# Patient Record
Sex: Male | Born: 1950 | Race: White | Hispanic: No | Marital: Married | Smoking: Former smoker
Health system: Southern US, Community
[De-identification: ages and names within clinical notes are randomized; demographics above are authoritative.]

## PROBLEM LIST (undated history)

## (undated) DIAGNOSIS — Z8579 Personal history of other malignant neoplasms of lymphoid, hematopoietic and related tissues: Secondary | ICD-10-CM

## (undated) DIAGNOSIS — I10 Essential (primary) hypertension: Secondary | ICD-10-CM

## (undated) DIAGNOSIS — Z8572 Personal history of non-Hodgkin lymphomas: Secondary | ICD-10-CM

## (undated) DIAGNOSIS — E7800 Pure hypercholesterolemia, unspecified: Secondary | ICD-10-CM

## (undated) DIAGNOSIS — I2510 Atherosclerotic heart disease of native coronary artery without angina pectoris: Secondary | ICD-10-CM

## (undated) HISTORY — PX: CHOLECYSTECTOMY: SHX55

## (undated) HISTORY — PX: CARDIAC CATHETERIZATION: SHX172

## (undated) HISTORY — PX: ELBOW SURGERY: SHX618

## (undated) HISTORY — PX: CERVICAL SPINE SURGERY: SHX589

## (undated) HISTORY — PX: KIDNEY STONE SURGERY: SHX686

---

## 2012-08-06 DIAGNOSIS — H903 Sensorineural hearing loss, bilateral: Secondary | ICD-10-CM | POA: Insufficient documentation

## 2012-08-06 DIAGNOSIS — D333 Benign neoplasm of cranial nerves: Secondary | ICD-10-CM | POA: Insufficient documentation

## 2014-04-23 DIAGNOSIS — R21 Rash and other nonspecific skin eruption: Secondary | ICD-10-CM | POA: Insufficient documentation

## 2014-08-31 DIAGNOSIS — L03119 Cellulitis of unspecified part of limb: Secondary | ICD-10-CM | POA: Insufficient documentation

## 2014-08-31 DIAGNOSIS — L02519 Cutaneous abscess of unspecified hand: Secondary | ICD-10-CM | POA: Insufficient documentation

## 2014-09-23 DIAGNOSIS — E785 Hyperlipidemia, unspecified: Secondary | ICD-10-CM | POA: Insufficient documentation

## 2014-09-23 DIAGNOSIS — Z955 Presence of coronary angioplasty implant and graft: Secondary | ICD-10-CM | POA: Insufficient documentation

## 2014-09-23 DIAGNOSIS — Z8249 Family history of ischemic heart disease and other diseases of the circulatory system: Secondary | ICD-10-CM | POA: Insufficient documentation

## 2016-05-28 ENCOUNTER — Other Ambulatory Visit: Payer: Self-pay | Admitting: Orthopaedic Surgery

## 2016-05-28 DIAGNOSIS — M546 Pain in thoracic spine: Secondary | ICD-10-CM

## 2016-06-07 DIAGNOSIS — M545 Low back pain, unspecified: Secondary | ICD-10-CM | POA: Insufficient documentation

## 2016-06-07 DIAGNOSIS — G8929 Other chronic pain: Secondary | ICD-10-CM | POA: Insufficient documentation

## 2017-02-21 DIAGNOSIS — IMO0001 Reserved for inherently not codable concepts without codable children: Secondary | ICD-10-CM | POA: Insufficient documentation

## 2018-06-16 ENCOUNTER — Encounter: Payer: Self-pay | Admitting: Podiatry

## 2018-06-16 ENCOUNTER — Ambulatory Visit (INDEPENDENT_AMBULATORY_CARE_PROVIDER_SITE_OTHER): Payer: Medicare Other | Admitting: Podiatry

## 2018-06-16 ENCOUNTER — Ambulatory Visit (INDEPENDENT_AMBULATORY_CARE_PROVIDER_SITE_OTHER): Payer: Medicare Other

## 2018-06-16 VITALS — BP 167/82 | HR 60

## 2018-06-16 DIAGNOSIS — M722 Plantar fascial fibromatosis: Secondary | ICD-10-CM | POA: Diagnosis not present

## 2018-06-16 MED ORDER — MELOXICAM 15 MG PO TABS: 15.0000 mg | ORAL_TABLET | Freq: Every day | ORAL | 1 refills | Status: AC

## 2018-06-18 NOTE — Progress Notes (Signed)
   Subjective: 68 year old male presenting today as a new patient with a chief complaint of constant pain to the left arch and plantar heel that began 2-3 weeks ago. Walking increases the pain and it is also worse in the morning when he first gets out of bed. He has been using custom orthotics he received from the [place] with minimal relief. Patient is here for further evaluation and treatment.   History reviewed. No pertinent past medical history.   Objective: Physical Exam General: The patient is alert and oriented x3 in no acute distress.  Dermatology: Skin is warm, dry and supple bilateral lower extremities. Negative for open lesions or macerations bilateral.   Vascular: Dorsalis Pedis and Posterior Tibial pulses palpable bilateral.  Capillary fill time is immediate to all digits.  Neurological: Epicritic and protective threshold intact bilateral.   Musculoskeletal: Tenderness to palpation to the plantar aspect of the left heel along the plantar fascia. All other joints range of motion within normal limits bilateral. Strength 5/5 in all groups bilateral.   Radiographic exam:   Normal osseous mineralization. Joint spaces preserved. No fracture/dislocation/boney destruction. No other soft tissue abnormalities or radiopaque foreign bodies.   Assessment: 1. Plantar fasciitis left foot  Plan of Care:  1. Patient evaluated. Xrays reviewed.   2. Injection of 0.5cc Celestone soluspan injected into the left plantar fascia.  3. Patient declined prescription for Medrol Dose Pak.  4. Rx for Meloxicam ordered for patient. 5. Continue using custom orthotics from the [place]. 6. Instructed patient regarding therapies and modalities at home to alleviate symptoms.  7. Return to clinic in 4 weeks.    From [place].   Edrick Kins, DPM [facility]  Dr. Edrick Kins, DPM    [address], [place] [address]                Office [phone]  Fax [phone]

## 2018-07-16 ENCOUNTER — Encounter: Payer: Self-pay | Admitting: Podiatry

## 2018-07-16 ENCOUNTER — Ambulatory Visit (INDEPENDENT_AMBULATORY_CARE_PROVIDER_SITE_OTHER): Payer: Medicare Other | Admitting: Podiatry

## 2018-07-16 ENCOUNTER — Other Ambulatory Visit: Payer: Medicare Other | Admitting: Orthotics

## 2018-07-16 DIAGNOSIS — M722 Plantar fascial fibromatosis: Secondary | ICD-10-CM | POA: Diagnosis not present

## 2018-07-20 NOTE — Progress Notes (Signed)
   Subjective: 68 year old male presenting today for follow up evaluation of plantar fasciitis of the left foot. He states the pain has improved but has not completely resolved. He states the foot is still sensitive when he first gets up in the morning time but improves as the day goes on. He notes the pain is about 1-2 throughout the day. He has been taking Meloxicam for treatment as directed. Patient is here for further evaluation and treatment.   History reviewed. No pertinent past medical history.   Objective: Physical Exam General: The patient is alert and oriented x3 in no acute distress.  Dermatology: Skin is warm, dry and supple bilateral lower extremities. Negative for open lesions or macerations bilateral.   Vascular: Dorsalis Pedis and Posterior Tibial pulses palpable bilateral.  Capillary fill time is immediate to all digits.  Neurological: Epicritic and protective threshold intact bilateral.   Musculoskeletal: Tenderness to palpation to the plantar aspect of the left heel along the plantar fascia. All other joints range of motion within normal limits bilateral. Strength 5/5 in all groups bilateral.   Assessment: 1. Plantar fasciitis left foot - improved   Plan of Care:  1. Patient evaluated.    2. Injection of 0.5cc Celestone soluspan injected into the left plantar fascia.  3. Appointment with Liliane Channel, Pedorthist, for custom molded orthotics.  4. Continue taking Meloxicam.  5. Return to clinic as needed.    From [place].   Edrick Kins, DPM [facility]  Dr. Edrick Kins, DPM    [address], [place] [address]                Office [phone]  Fax [phone]

## 2018-09-09 ENCOUNTER — Telehealth: Payer: Self-pay | Admitting: *Deleted

## 2018-09-09 MED ORDER — METHYLPREDNISOLONE 4 MG PO TBPK: ORAL_TABLET | ORAL | 0 refills | Status: DC

## 2018-09-09 NOTE — Telephone Encounter (Signed)
Pt's wife, [name] called states Dr. Amalia Hailey had offered a medrol dose pack for pt at one time and pt would like to have the medrol dose pack called in

## 2018-09-09 NOTE — Telephone Encounter (Signed)
I called pt's wife, Pam and informed I had to wait for a response from Dr. Amalia Hailey who was in the [place] office today and would respond around lunchtime, to call the pharmacy in 1 hour.

## 2018-09-09 NOTE — Telephone Encounter (Signed)
I informed Pam the medrol dose pack had been sent to the pharmacy.

## 2018-09-09 NOTE — Telephone Encounter (Signed)
Dr. Trinidad Curet the medrol dose pack as offered 06/16/2018.

## 2018-09-15 ENCOUNTER — Encounter: Payer: Self-pay | Admitting: Podiatry

## 2018-09-15 ENCOUNTER — Ambulatory Visit (INDEPENDENT_AMBULATORY_CARE_PROVIDER_SITE_OTHER): Payer: Medicare Other | Admitting: Podiatry

## 2018-09-15 ENCOUNTER — Other Ambulatory Visit: Payer: Self-pay

## 2018-09-15 DIAGNOSIS — M722 Plantar fascial fibromatosis: Secondary | ICD-10-CM

## 2018-09-15 NOTE — Progress Notes (Signed)
   Subjective: 68 year old male presenting today for follow up evaluation of plantar fasciitis of the left foot.  Patient states that he was doing fine with no pain until approximately 2 weeks ago.  He denies any change in activity.  He began to notice significant pain and he is currently using a walking stick in the mornings to get out of bed and walk around.  He presents for further treatment evaluation   No past medical history on file.   Objective: Physical Exam General: The patient is alert and oriented x3 in no acute distress.  Dermatology: Skin is warm, dry and supple bilateral lower extremities. Negative for open lesions or macerations bilateral.   Vascular: Dorsalis Pedis and Posterior Tibial pulses palpable bilateral.  Capillary fill time is immediate to all digits.  Neurological: Epicritic and protective threshold intact bilateral.   Musculoskeletal: Tenderness to palpation to the plantar aspect of the left heel along the plantar fascia. All other joints range of motion within normal limits bilateral. Strength 5/5 in all groups bilateral.   Assessment: 1. Plantar fasciitis left foot  Plan of Care:  1. Patient evaluated.    2. Injection of 0.5cc Celestone soluspan injected into the left plantar fascia.  3.  Appointment for new custom molded orthotics with Liliane Channel, Pedorthist, are pending since the patient has not been able to go to the [place] to turn in the prescription because of the COVID-19 pandemic 4.  Today night splint was provided for the patient with instructions to help alleviate morning pain and keep the foot in a flexed position while sleeping 5.  Plantar fascial brace dispensed to wear during the day 6.  Return to clinic in 4 weeks.  If the patient has not improved we may need to discuss surgical intervention since the patient has a longstanding history of intermittent plantar fascial pain  From [place].   Edrick Kins, DPM [facility]  Dr. Edrick Kins, DPM    [address], [place] [address]                Office [phone]  Fax [phone]

## 2018-10-13 ENCOUNTER — Encounter: Payer: Self-pay | Admitting: Podiatry

## 2018-10-13 ENCOUNTER — Other Ambulatory Visit: Payer: Self-pay

## 2018-10-13 ENCOUNTER — Ambulatory Visit (INDEPENDENT_AMBULATORY_CARE_PROVIDER_SITE_OTHER): Payer: Medicare Other | Admitting: Podiatry

## 2018-10-13 VITALS — Temp 98.2°F

## 2018-10-13 DIAGNOSIS — M722 Plantar fascial fibromatosis: Secondary | ICD-10-CM | POA: Diagnosis not present

## 2018-10-13 NOTE — Progress Notes (Signed)
   Subjective: 68 year old male presenting today for follow up evaluation of plantar fasciitis of the left foot.  Patient states that he is no longer experiencing pain to the left heel.  He noticed significant improvement with the plantar fascial brace as well as the night splint.  He also says the injection he received last visit on 09/15/2018 helped significantly.  He is still waiting to get custom molded orthotics from the [place].  He has an appointment in July of this year.  No past medical history on file.   Objective: Physical Exam General: The patient is alert and oriented x3 in no acute distress.  Dermatology: Skin is warm, dry and supple bilateral lower extremities. Negative for open lesions or macerations bilateral.   Vascular: Dorsalis Pedis and Posterior Tibial pulses palpable bilateral.  Capillary fill time is immediate to all digits.  Neurological: Epicritic and protective threshold intact bilateral.   Musculoskeletal: Significantly improved tenderness to palpation to the plantar aspect of the left heel along the plantar fascia. All other joints range of motion within normal limits bilateral. Strength 5/5 in all groups bilateral.   Assessment: 1. Plantar fasciitis left foot  Plan of Care:  1. Patient evaluated.    2.  Patient declined injection today. 3.  Continue wearing plantar fascial brace and night splint as needed 4.  Continue wearing good supportive shoes with his old custom insoles.  Patient is planning on getting new insoles from the [place].  5.  Return to clinic as needed  [place].   Edrick Kins, DPM [facility]  Dr. Edrick Kins, DPM    [address], [place] [address]                Office [phone]  Fax [phone]

## 2018-11-05 ENCOUNTER — Ambulatory Visit: Payer: PRIVATE HEALTH INSURANCE | Admitting: Podiatry

## 2019-05-26 ENCOUNTER — Ambulatory Visit: Payer: PRIVATE HEALTH INSURANCE | Admitting: Cardiovascular Disease

## 2019-08-21 ENCOUNTER — Ambulatory Visit (INDEPENDENT_AMBULATORY_CARE_PROVIDER_SITE_OTHER): Payer: Medicare Other | Admitting: Podiatry

## 2019-08-21 ENCOUNTER — Other Ambulatory Visit: Payer: Self-pay

## 2019-08-21 DIAGNOSIS — M722 Plantar fascial fibromatosis: Secondary | ICD-10-CM

## 2019-08-23 NOTE — Progress Notes (Signed)
   Subjective: 69 year old male presenting today with a chief complaint of a plantar fasciitis flare up of the left foot that began about four days ago. He reports sharp, burning pain. He reports working on a ladder lately and believes that is what has exacerbated his symptoms. He has been using the plantar fascial brace with some relief. Being on the foot for long periods of time increases the pain. Patient is here for further evaluation and treatment.   No past medical history on file.   Objective: Physical Exam General: The patient is alert and oriented x3 in no acute distress.  Dermatology: Skin is warm, dry and supple bilateral lower extremities. Negative for open lesions or macerations bilateral.   Vascular: Dorsalis Pedis and Posterior Tibial pulses palpable bilateral.  Capillary fill time is immediate to all digits.  Neurological: Epicritic and protective threshold intact bilateral.   Musculoskeletal: Tenderness to palpation to the plantar aspect of the left heel along the plantar fascia. All other joints range of motion within normal limits bilateral. Strength 5/5 in all groups bilateral.   Assessment: 1. Plantar fasciitis left foot  Plan of Care:  1. Patient evaluated.    2. Continue using plantar fascial brace. New one dispensed today.  3. Continue using custom orthotics from the [place].  4. Continue using night splint.  5. Continue taking OTC Motrin.  6. Injection of 0.5 mLs Celestone Soluspan injected into the left heel.  7. Return to clinic as needed.   From [place].   Edrick Kins, DPM [facility]  Dr. Edrick Kins, DPM    [address], [place] [address]                Office [phone]  Fax [phone]

## 2020-01-18 DIAGNOSIS — Z6828 Body mass index (BMI) 28.0-28.9, adult: Secondary | ICD-10-CM | POA: Insufficient documentation

## 2020-01-18 DIAGNOSIS — I10 Essential (primary) hypertension: Secondary | ICD-10-CM | POA: Insufficient documentation

## 2020-01-18 DIAGNOSIS — M5416 Radiculopathy, lumbar region: Secondary | ICD-10-CM | POA: Insufficient documentation

## 2020-02-19 ENCOUNTER — Encounter: Payer: Self-pay | Admitting: Gastroenterology

## 2020-02-21 ENCOUNTER — Encounter (HOSPITAL_COMMUNITY): Payer: Self-pay | Admitting: Emergency Medicine

## 2020-02-21 ENCOUNTER — Emergency Department (HOSPITAL_COMMUNITY): Payer: No Typology Code available for payment source

## 2020-02-21 ENCOUNTER — Other Ambulatory Visit: Payer: Self-pay

## 2020-02-21 ENCOUNTER — Inpatient Hospital Stay (HOSPITAL_COMMUNITY)
Admission: EM | Admit: 2020-02-21 | Discharge: 2020-02-26 | DRG: 219 | Disposition: A | Discharge status: Home / Self Care | Payer: No Typology Code available for payment source | Attending: Thoracic Surgery (Cardiothoracic Vascular Surgery) | Admitting: Thoracic Surgery (Cardiothoracic Vascular Surgery)

## 2020-02-21 DIAGNOSIS — H539 Unspecified visual disturbance: Secondary | ICD-10-CM | POA: Diagnosis present

## 2020-02-21 DIAGNOSIS — Z9889 Other specified postprocedural states: Secondary | ICD-10-CM

## 2020-02-21 DIAGNOSIS — E785 Hyperlipidemia, unspecified: Secondary | ICD-10-CM | POA: Diagnosis present

## 2020-02-21 DIAGNOSIS — E8770 Fluid overload, unspecified: Secondary | ICD-10-CM | POA: Diagnosis present

## 2020-02-21 DIAGNOSIS — I350 Nonrheumatic aortic (valve) stenosis: Secondary | ICD-10-CM | POA: Diagnosis present

## 2020-02-21 DIAGNOSIS — Z87891 Personal history of nicotine dependence: Secondary | ICD-10-CM | POA: Diagnosis not present

## 2020-02-21 DIAGNOSIS — Z87442 Personal history of urinary calculi: Secondary | ICD-10-CM

## 2020-02-21 DIAGNOSIS — J939 Pneumothorax, unspecified: Secondary | ICD-10-CM

## 2020-02-21 DIAGNOSIS — D72828 Other elevated white blood cell count: Secondary | ICD-10-CM | POA: Diagnosis present

## 2020-02-21 DIAGNOSIS — Z20822 Contact with and (suspected) exposure to covid-19: Secondary | ICD-10-CM | POA: Diagnosis present

## 2020-02-21 DIAGNOSIS — G453 Amaurosis fugax: Secondary | ICD-10-CM | POA: Diagnosis present

## 2020-02-21 DIAGNOSIS — C8590 Non-Hodgkin lymphoma, unspecified, unspecified site: Secondary | ICD-10-CM | POA: Diagnosis present

## 2020-02-21 DIAGNOSIS — Z7902 Long term (current) use of antithrombotics/antiplatelets: Secondary | ICD-10-CM | POA: Diagnosis not present

## 2020-02-21 DIAGNOSIS — D696 Thrombocytopenia, unspecified: Secondary | ICD-10-CM | POA: Diagnosis present

## 2020-02-21 DIAGNOSIS — Z9049 Acquired absence of other specified parts of digestive tract: Secondary | ICD-10-CM | POA: Diagnosis not present

## 2020-02-21 DIAGNOSIS — C8290 Follicular lymphoma, unspecified, unspecified site: Secondary | ICD-10-CM | POA: Diagnosis present

## 2020-02-21 DIAGNOSIS — D62 Acute posthemorrhagic anemia: Secondary | ICD-10-CM | POA: Diagnosis not present

## 2020-02-21 DIAGNOSIS — E7800 Pure hypercholesterolemia, unspecified: Secondary | ICD-10-CM | POA: Diagnosis present

## 2020-02-21 DIAGNOSIS — G459 Transient cerebral ischemic attack, unspecified: Secondary | ICD-10-CM | POA: Diagnosis not present

## 2020-02-21 DIAGNOSIS — I4510 Unspecified right bundle-branch block: Secondary | ICD-10-CM | POA: Diagnosis present

## 2020-02-21 DIAGNOSIS — I214 Non-ST elevation (NSTEMI) myocardial infarction: Secondary | ICD-10-CM | POA: Diagnosis present

## 2020-02-21 DIAGNOSIS — Z8249 Family history of ischemic heart disease and other diseases of the circulatory system: Secondary | ICD-10-CM | POA: Diagnosis not present

## 2020-02-21 DIAGNOSIS — I252 Old myocardial infarction: Secondary | ICD-10-CM

## 2020-02-21 DIAGNOSIS — I200 Unstable angina: Secondary | ICD-10-CM | POA: Diagnosis present

## 2020-02-21 DIAGNOSIS — Z23 Encounter for immunization: Secondary | ICD-10-CM | POA: Diagnosis not present

## 2020-02-21 DIAGNOSIS — I10 Essential (primary) hypertension: Secondary | ICD-10-CM | POA: Diagnosis present

## 2020-02-21 DIAGNOSIS — I973 Postprocedural hypertension: Secondary | ICD-10-CM | POA: Diagnosis not present

## 2020-02-21 DIAGNOSIS — Z8673 Personal history of transient ischemic attack (TIA), and cerebral infarction without residual deficits: Secondary | ICD-10-CM

## 2020-02-21 DIAGNOSIS — I25110 Atherosclerotic heart disease of native coronary artery with unstable angina pectoris: Principal | ICD-10-CM | POA: Diagnosis present

## 2020-02-21 DIAGNOSIS — Z951 Presence of aortocoronary bypass graft: Secondary | ICD-10-CM

## 2020-02-21 DIAGNOSIS — Z9221 Personal history of antineoplastic chemotherapy: Secondary | ICD-10-CM

## 2020-02-21 DIAGNOSIS — Z953 Presence of xenogenic heart valve: Secondary | ICD-10-CM

## 2020-02-21 DIAGNOSIS — J9811 Atelectasis: Secondary | ICD-10-CM

## 2020-02-21 DIAGNOSIS — I2510 Atherosclerotic heart disease of native coronary artery without angina pectoris: Secondary | ICD-10-CM | POA: Diagnosis present

## 2020-02-21 DIAGNOSIS — Z79899 Other long term (current) drug therapy: Secondary | ICD-10-CM | POA: Diagnosis not present

## 2020-02-21 HISTORY — DX: Personal history of other malignant neoplasms of lymphoid, hematopoietic and related tissues: Z85.79

## 2020-02-21 HISTORY — DX: Pure hypercholesterolemia, unspecified: E78.00

## 2020-02-21 HISTORY — DX: Essential (primary) hypertension: I10

## 2020-02-21 HISTORY — DX: Personal history of non-Hodgkin lymphomas: Z85.72

## 2020-02-21 HISTORY — DX: Atherosclerotic heart disease of native coronary artery without angina pectoris: I25.10

## 2020-02-21 LAB — TROPONIN I (HIGH SENSITIVITY)
Troponin I (High Sensitivity): 293 ng/L (ref <18)
Troponin I (High Sensitivity): 418 ng/L (ref <18)

## 2020-02-21 LAB — CBC
HCT: 41.1 % (ref 39.0–52.0)
Hemoglobin: 14.3 g/dL (ref 13.0–17.0)
MCH: 32 pg (ref 26.0–34.0)
MCHC: 34.8 g/dL (ref 30.0–36.0)
MCV: 91.9 fL (ref 80.0–100.0)
Platelets: 142 10*3/uL — ABNORMAL LOW (ref 150–400)
RBC: 4.47 MIL/uL (ref 4.22–5.81)
RDW: 12.9 % (ref 11.5–15.5)
WBC: 7.8 10*3/uL (ref 4.0–10.5)
nRBC: 0 % (ref 0.0–0.2)

## 2020-02-21 LAB — BASIC METABOLIC PANEL
Anion gap: 13 (ref 5–15)
BUN: 11 mg/dL (ref 8–23)
CO2: 23 mmol/L (ref 22–32)
Calcium: 9.4 mg/dL (ref 8.9–10.3)
Chloride: 103 mmol/L (ref 98–111)
Creatinine, Ser: 0.88 mg/dL (ref 0.61–1.24)
GFR calc Af Amer: >60 mL/min (ref >60)
GFR calc non Af Amer: >60 mL/min (ref >60)
Glucose, Bld: 187 mg/dL — ABNORMAL HIGH (ref 70–99)
Potassium: 3.1 mmol/L — ABNORMAL LOW (ref 3.5–5.1)
Sodium: 139 mmol/L (ref 135–145)

## 2020-02-21 LAB — MAGNESIUM: Magnesium: 1.7 mg/dL (ref 1.7–2.4)

## 2020-02-21 LAB — SARS CORONAVIRUS 2 BY RT PCR (HOSPITAL ORDER, PERFORMED IN ~~LOC~~ HOSPITAL LAB): SARS Coronavirus 2: NEGATIVE

## 2020-02-21 LAB — BRAIN NATRIURETIC PEPTIDE: B Natriuretic Peptide: 147 pg/mL — ABNORMAL HIGH (ref 0.0–100.0)

## 2020-02-21 IMAGING — CR DG CHEST 2V
2 series · 2 of 2 positions shown · non-contrast
Comparison: None

CLINICAL DATA: Chest pain, shortness of breath and nausea for 2
months, 2 episodes today, tightness LEFT breast, hypertension,
former smoker

EXAM:
CHEST - 2 VIEW

[chest pa]
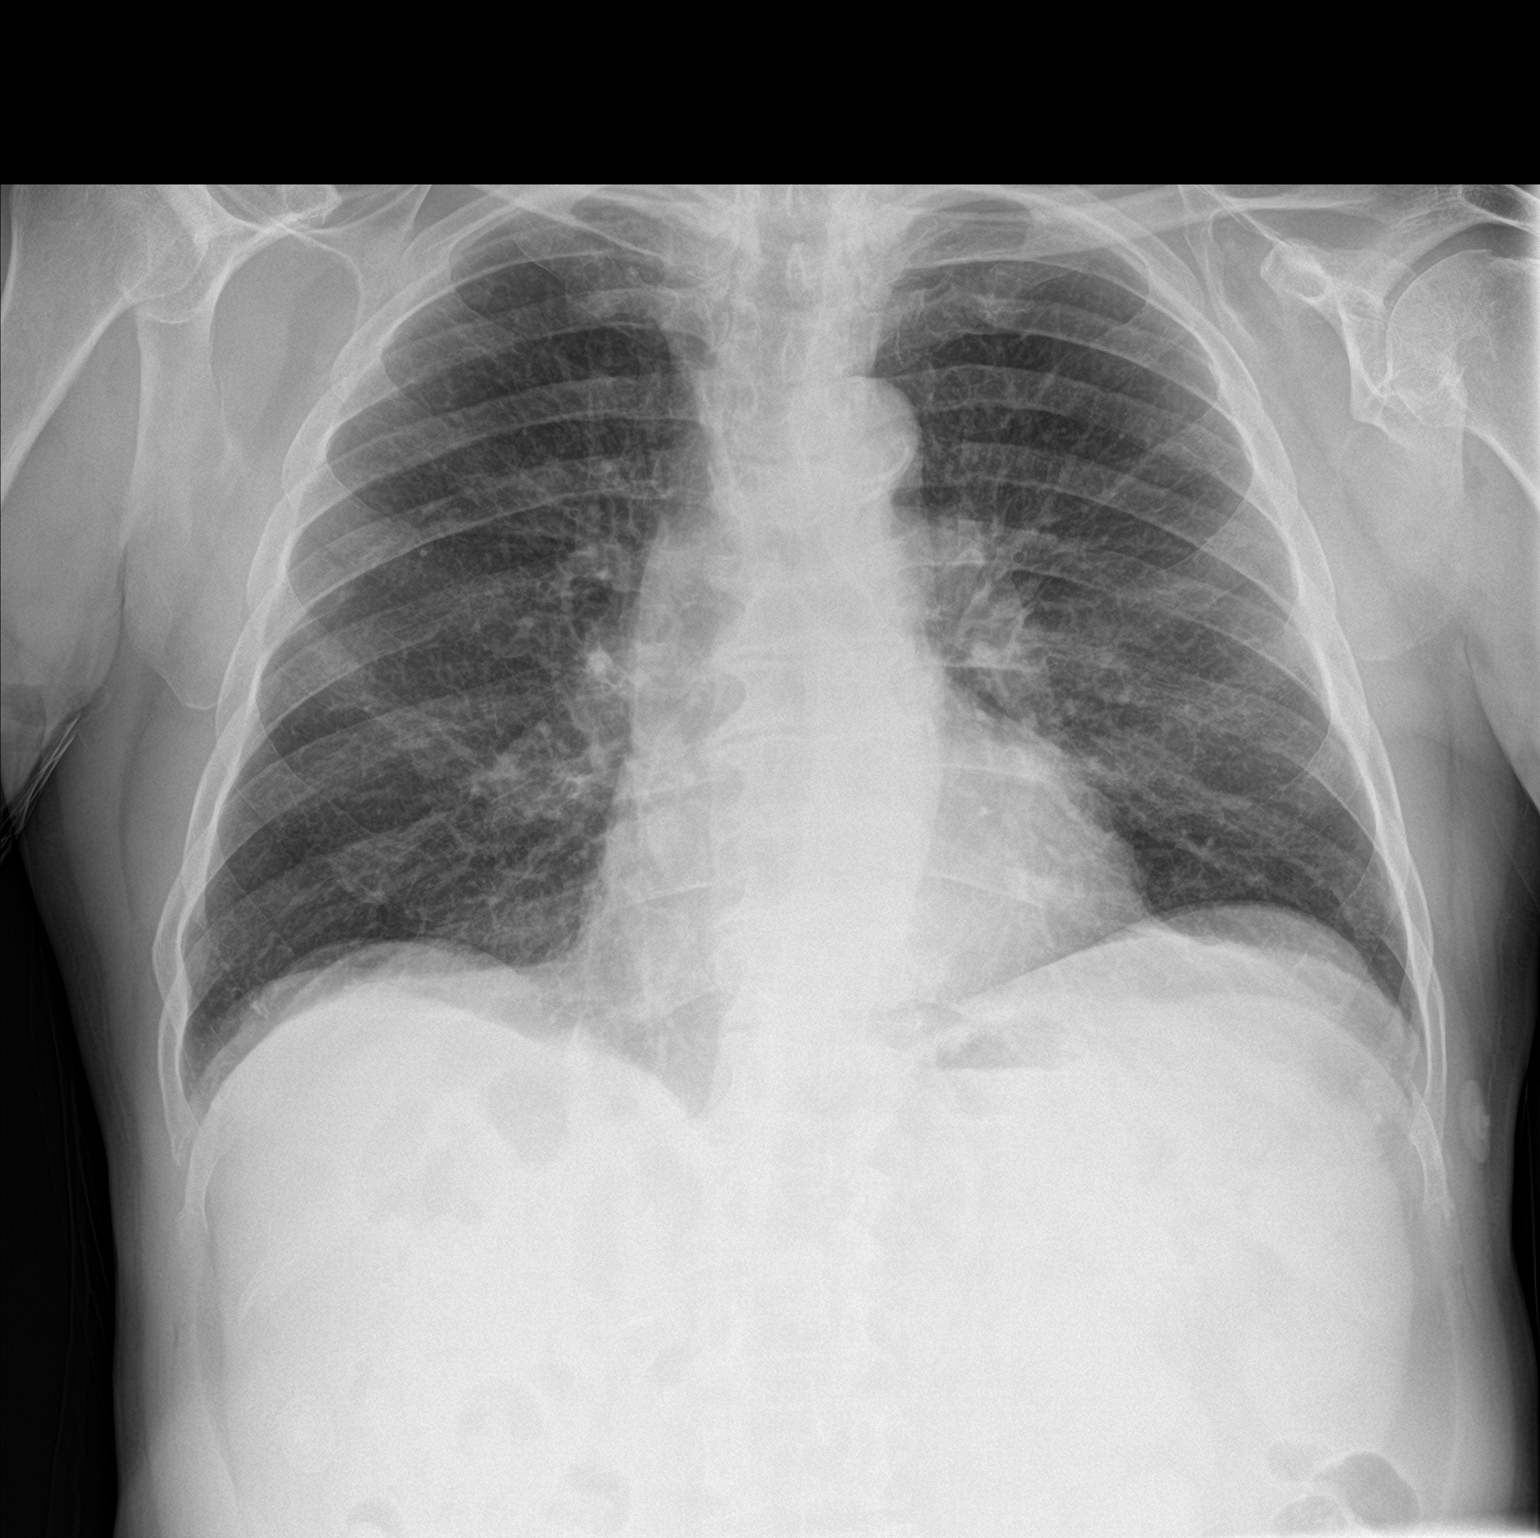

[chest lat]
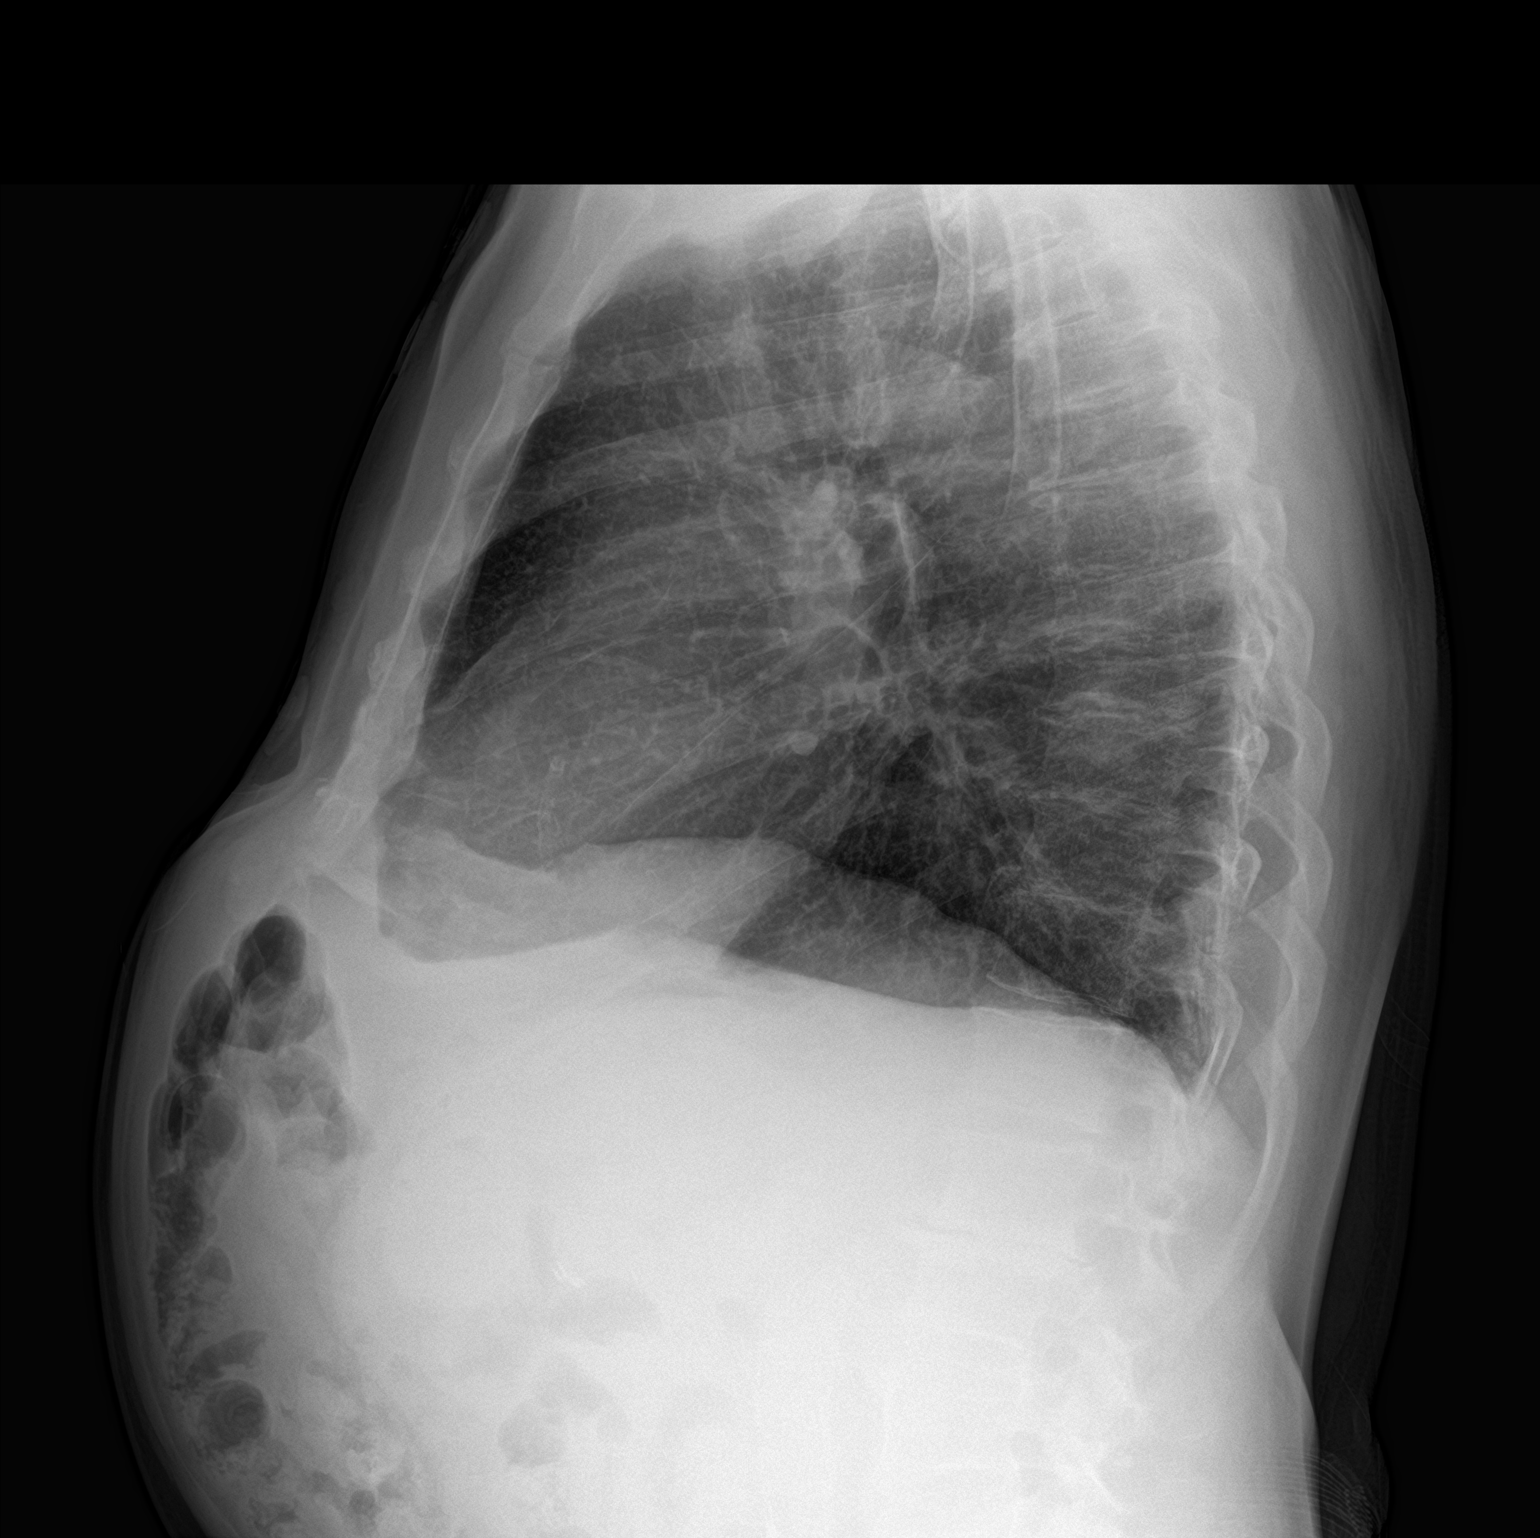

[2 of 2 positions shown; findings below may reference images not displayed]

FINDINGS: Normal heart size, mediastinal contours, and pulmonary vascularity.

Mild peribronchial thickening and accentuation of perihilar
interstitial markings.

No pulmonary infiltrate, pleural effusion, or pneumothorax.

Endplate spur formation thoracic spine.
IMPRESSION: Bronchitic changes without infiltrate.

## 2020-02-21 MED ORDER — HEPARIN (PORCINE) 25000 UT/250ML-% IV SOLN
850.0000 [IU]/h | INTRAVENOUS | Status: DC
  Administered 2020-02-21: 850 [IU]/h via INTRAVENOUS
  Filled 2020-02-21: qty 250

## 2020-02-21 MED ORDER — LOSARTAN POTASSIUM 50 MG PO TABS
100.0000 mg | ORAL_TABLET | Freq: Every day | ORAL | Status: DC
  Administered 2020-02-22: 100 mg via ORAL
  Filled 2020-02-21: qty 2

## 2020-02-21 MED ORDER — ONDANSETRON HCL 4 MG/2ML IJ SOLN: 4.0000 mg | Freq: Four times a day (QID) | INTRAMUSCULAR | Status: DC | PRN

## 2020-02-21 MED ORDER — HEPARIN BOLUS VIA INFUSION
4000.0000 [IU] | Freq: Once | INTRAVENOUS | Status: AC
  Administered 2020-02-21: 4000 [IU] via INTRAVENOUS
  Filled 2020-02-21: qty 4000

## 2020-02-21 MED ORDER — ATORVASTATIN CALCIUM 80 MG PO TABS
80.0000 mg | ORAL_TABLET | Freq: Every day | ORAL | Status: DC
  Administered 2020-02-22: 80 mg via ORAL
  Filled 2020-02-21: qty 1

## 2020-02-21 MED ORDER — ACETAMINOPHEN 325 MG PO TABS: 650.0000 mg | ORAL_TABLET | ORAL | Status: DC | PRN

## 2020-02-21 MED ORDER — ASPIRIN 300 MG RE SUPP: 300.0000 mg | RECTAL | Status: AC

## 2020-02-21 MED ORDER — ASPIRIN EC 81 MG PO TBEC
81.0000 mg | DELAYED_RELEASE_TABLET | Freq: Every day | ORAL | Status: DC
  Administered 2020-02-22: 81 mg via ORAL
  Filled 2020-02-21: qty 1

## 2020-02-21 MED ORDER — NITROGLYCERIN 0.4 MG SL SUBL
0.4000 mg | SUBLINGUAL_TABLET | SUBLINGUAL | Status: DC | PRN
  Administered 2020-02-22 (×2): 0.4 mg via SUBLINGUAL
  Filled 2020-02-21 (×2): qty 1

## 2020-02-21 MED ORDER — ASPIRIN 81 MG PO CHEW
324.0000 mg | CHEWABLE_TABLET | ORAL | Status: AC
  Administered 2020-02-21: 324 mg via ORAL
  Filled 2020-02-21: qty 4

## 2020-02-21 MED ORDER — NITROGLYCERIN 2 % TD OINT
0.5000 [in_us] | TOPICAL_OINTMENT | Freq: Four times a day (QID) | TRANSDERMAL | Status: AC
  Administered 2020-02-22 (×2): 0.5 [in_us] via TOPICAL
  Filled 2020-02-21: qty 30

## 2020-02-21 MED ORDER — ASPIRIN 81 MG PO CHEW
324.0000 mg | CHEWABLE_TABLET | Freq: Once | ORAL | Status: AC
  Administered 2020-02-21: 324 mg via ORAL
  Filled 2020-02-21: qty 4

## 2020-02-21 MED ORDER — CARVEDILOL 6.25 MG PO TABS
6.2500 mg | ORAL_TABLET | Freq: Two times a day (BID) | ORAL | Status: DC
  Administered 2020-02-22: 6.25 mg via ORAL
  Filled 2020-02-21: qty 1

## 2020-02-21 NOTE — H&P (Signed)
Cardiology History & Physical    Patient ID: Alan Mcdonald MRN: [id number], DOB/AGE: Nov 05, 1950   Admit date: 02/21/2020  Primary Physician: [facility] Primary Cardiologist: No primary care provider on file.  Patient Profile    69 year old male with history of CAD s/p PCI to his RCA in the setting of unstable angina who presents with chest pain.  History of Present Illness    This is a 69 year old male with a history of coronary artery disease with prior PCI to his RCA in the setting of unstable angina who presents with chest pain.  He reports that for the last month he has been having chest pain that started to bother him while working out on the elliptical in the morning.  When he developed it he stopped working on his elliptical but has noticed substernal chest pain occurring with mild to moderate exertion.  Today it occurred this morning and was much more severe and radiating.  It also occurred at rest for the first time.  He had a second episode this afternoon and decided to present to the ED.  Denies exertional dyspnea, orthopnea, PND, palpitations, LE edema.  He had unstable angina in the early 2000s at which time he underwent revascularization of his RCA at another facility.  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  He's a former smoker for 20 years but stopped at time of his revascularization.  He reports having had a murmur first remarked upon when he was diagnosed with lymphoma.  He recently had an episode of amaurosis fugax.  He also had a DAT scan with concern for parkinsonism which showed a remote CVA.  He was started on plavix at that time and last dose was today.  Past Medical History   Past Medical History:  Diagnosis Date  . High cholesterol   . Hypertension     Past Surgical History:  Procedure Laterality Date  . CERVICAL SPINE SURGERY    . ELBOW  SURGERY    . KIDNEY STONE SURGERY       Allergies No Known Allergies  Home Medications    Prior to Admission medications   Medication Sig Start Date End Date Taking? Authorizing Provider  aspirin EC 81 MG tablet Take by mouth. 02/23/14  Yes [provider]  atorvastatin (LIPITOR) 40 MG tablet Take 40 mg by mouth every evening.  07/25/12  Yes [provider]  B Complex Vitamins (VITAMIN B-COMPLEX) TABS Take 1 tablet by mouth daily.    Yes [provider]  clopidogrel (PLAVIX) 75 MG tablet Take 75 mg by mouth daily.   Yes [provider]  Coenzyme Q10 (CO Q-10) 200 MG CAPS Take 1 tablet by mouth daily.   Yes [provider]  glucosamine-chondroitin 500-400 MG tablet Take 1 tablet by mouth daily.   Yes [provider]  hydrochlorothiazide (HYDRODIURIL) 25 MG tablet Take 25 mg by mouth daily.   Yes [provider]  losartan (COZAAR) 100 MG tablet Take 100 mg by mouth daily.   Yes [provider]  Multiple Vitamin (MULTIVITAMIN WITH MINERALS) TABS tablet Take 1 tablet by mouth daily.   Yes [provider]  multivitamin-iron-minerals-folic acid (THERAPEUTIC-M) TABS tablet Take 1 tablet by mouth daily.    Yes [provider]  omeprazole (PRILOSEC) 20 MG capsule Take 20 mg by mouth daily.   Yes [provider]  RA KRILL OIL 500 MG CAPS Take by mouth.   Yes [provider]  Turmeric (QC TUMERIC COMPLEX) 500 MG CAPS Take 1 tablet by mouth daily.   Yes [provider]  Zinc 30 MG CAPS Take 1 capsule by mouth daily.   Yes [provider]  methylPREDNISolone (MEDROL) 4 MG TBPK tablet Take as directed. Patient not taking: Reported on 02/21/2020 09/09/18   Edrick Kins, DPM    Family History    Multiple first degree family members with early CAD  Social History    Smoked for 20 years stopped 20 years ago.  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  Served in [place].  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  No drugs.  Occasional etOH.  Accompanied by wife.  Social History   Socioeconomic History  . Marital status: Married    Spouse name: Not on file  . Number of children: Not on file  . Years of education: Not on file  . Highest education level: Not on file  Occupational History  . Not on file  Tobacco Use  . Smoking status: Never Smoker  . Smokeless tobacco: Never Used  Substance and Sexual Activity  . Alcohol use: Not on file  . Drug use: Not on file  . Sexual activity: Not on file  Other Topics Concern  . Not on file  Social History Narrative  . Not on file   Social Determinants of Health   Financial Resource Strain:   . Difficulty of Paying Living Expenses: Not on file  Food Insecurity:   . Worried About Charity fundraiser in the Last Year: Not on file  . Ran Out of Food in the Last Year: Not on file  Transportation Needs:   . Lack of Transportation (Medical): Not on file  . Lack of Transportation (Non-Medical): Not on file  Physical Activity:   . Days of Exercise per Week: Not on file  . Minutes of Exercise per Session: Not on file  Stress:   . Feeling of Stress : Not on file  Social Connections:   . Frequency of Communication with Friends and Family: Not on file  . Frequency of Social Gatherings with Friends and Family: Not on file  . Attends Religious Services: Not on file  . Active Member of Clubs or Organizations: Not on file  . Attends Archivist Meetings: Not on file  . Marital Status: Not on file  Intimate Partner Violence:   . Fear of Current or Ex-Partner: Not on file  . Emotionally Abused: Not on file  . Physically Abused: Not on file  . Sexually Abused: Not on file     Review of Systems    General:  No chills, fever, night sweats or weight changes.  Cardiovascular:   chest pain, dyspnea on exertion, edema, orthopnea, palpitations, paroxysmal nocturnal dyspnea. Dermatological: No rash,  lesions/masses Respiratory: No cough, dyspnea Urologic: No hematuria, dysuria Abdominal:   No nausea, vomiting, diarrhea, bright red blood per rectum, melena, or hematemesis Neurologic:  No visual changes, wkns, changes in mental status. All other systems reviewed and are otherwise negative except as noted above.  Physical Exam    BP (!) 157/116   Pulse 65   Temp (!) 97.4 F (36.3 C) (Oral)   Resp 15   Ht 5\' 3"  (1.6 m)   Wt 72.6 kg   SpO2 98%   BMI 28.34 kg/m  General: Alert, NAD HEENT: Normal  Neck: No  bruits or JVD. Lungs:  Resp regular and unlabored, CTA bilaterally. Heart: II/VI SEM radiating to carotid otherwise regular rate and rhythm no gallop Abdomen: Soft, non-tender, non-distended, BS +.  Extremities: Warm. No clubbing, cyanosis or edema. DP/PT/Radials 2+ and equal bilaterally. Psych: Normal affect. Neuro: Alert and oriented. No gross focal deficits. No abnormal movements.  Labs    Troponin (Point of Care Test) No results for input(s): TROPIPOC in the last 72 hours. No results for input(s): CKTOTAL, CKMB, TROPONINI in the last 72 hours. Lab Results  Component Value Date   WBC 7.8 02/21/2020   HGB 14.3 02/21/2020   HCT 41.1 02/21/2020   MCV 91.9 02/21/2020   PLT 142 (L) 02/21/2020    Recent Labs  Lab 02/21/20 1555  NA 139  K 3.1*  CL 103  CO2 23  BUN 11  CREATININE 0.88  CALCIUM 9.4  GLUCOSE 187*   No results found for: CHOL, HDL, LDLCALC, TRIG No results found for: DDIMER   Radiology Studies    DG Chest 2 View  Result Date: 02/21/2020 CLINICAL DATA:  Chest pain, shortness of breath and nausea for 2 months, 2 episodes today, tightness LEFT breast, hypertension, former smoker EXAM: CHEST - 2 VIEW COMPARISON:  None FINDINGS: Normal heart size, mediastinal contours, and pulmonary vascularity. Mild peribronchial thickening and accentuation of perihilar interstitial markings. No pulmonary infiltrate, pleural effusion, or pneumothorax. Endplate spur  formation thoracic spine. IMPRESSION: Bronchitic changes without infiltrate. Electronically Signed   By: Lavonia Dana M.D.   On: 02/21/2020 16:34    ECG & Cardiac Imaging   Normal sinus rhythm.  Anterior ST depressions.  Repeated posterior ECG without ST elevations.  No echo in our system.  Transposed from [facility] system: Vasodilator MPI 8/21 Unremarkable stress portion.  SSS3 with no clear perfusion defect.  TTE 05/2019 Technically challenging study. The left ventricle is hyperdynamic. LV ejection fraction = 65-70%. The left ventricular wall motion is normal. The left ventricular size is normal. Mild left ventricular hypertrophy Left ventricular filling pattern is prolonged relaxation. The right ventricle is normal in size and function. The left atrial size is normal. Right atrial size is normal. Mild calcific aortic stenosis with mean gradient of 17 mmHg There was insufficient TR detected to calculate RV systolic pressure. Aortic sinus measures 3.6 cm and ascending aorta measures 3.5 cm IVC size was normal. There is no pericardial effusion. No prior study.  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  EKG with anterolateral ST depressions resolving on repeat occurring in the setting of chest pain.  Currently with minimal chest pain, 1/10 resolved from prior.  Despite recent negative stress test he now has presentation consistent with NSTEMI and recommend invasive coronary angiography.  Problem list NSTEMI Mild aortic stenosis Hypertension Left ventricular hypertrophy  Plan - ASA, heparin infusion ACS nomogram - Atorvastatin 80 mg now and indefinitely - Start carvedilol 6.25 mg BID - EKG PRN chest pain - TTE in AM - NPO @ MN for LHC - TTE - Risk stratification labs - Cardiac rehab referral - Admit to telemetry    Advanced Care Planning:  Full code  Signed, Delight Hoh, MD 02/21/2020, 10:17 PM

## 2020-02-21 NOTE — ED Notes (Signed)
Dr Dayna Barker notified of trop of 293

## 2020-02-21 NOTE — ED Triage Notes (Signed)
C/o chest pain, SOB, and nausea x 2 months.

## 2020-02-21 NOTE — Progress Notes (Addendum)
[place] for Heparin Indication: chest pain/ACS  No Known Allergies  Patient Measurements: Height: 5\' 3"  (160 cm) Weight: 72.6 kg (160 lb) IBW/kg (Calculated) : 56.9 Heparin Dosing Weight: 71.6kg   Vital Signs: Temp: 97.4 F (36.3 C) (09/12 1518) Temp Source: Oral (09/12 1518) BP: 168/91 (09/12 1945) Pulse Rate: 59 (09/12 1945)  Labs: Recent Labs    02/21/20 1555  HGB 14.3  HCT 41.1  PLT 142*  CREATININE 0.88  TROPONINIHS 293*    Estimated Creatinine Clearance: 69.8 mL/min (by C-G formula based on SCr of 0.88 mg/dL).   Medical History: Past Medical History:  Diagnosis Date  . High cholesterol   . Hypertension     Medications:  Scheduled:  . heparin  4,000 Units Intravenous Once    Assessment: Patient is a 69 tom that presented to the ED with c/o chest pain and SOB. The patient was found to have an elevated trop level. Pharmacy has been asked to dose heparin at this time for ACS. No blood thinners noted however plt count was low on presenting to the ED  (142).  Goal of Therapy:  Heparin level 0.3-0.7 units/ml Monitor platelets by anticoagulation protocol: Yes   Plan:  - Heparin bolus 4000 units IV x 1 dose - Heparin drip @ 850 units/hr - Heparin level in ~ 6 hours  - Monitor patient for s/s of bleeding and CBC while on heparin  - Monitor platelets closely   Duanne Limerick PharmD. BCPS  02/21/2020,7:48 PM

## 2020-02-21 NOTE — ED Provider Notes (Signed)
[facility] EMERGENCY DEPARTMENT Provider Note   CSN: [id number] Arrival date & time: 02/21/20  1515     History Chief Complaint  Patient presents with  . Chest Pain    Rashed Edler is a 69 y.o. male past medical history of hypertension, hyperlipidemia, CAD status post stenting in 2003 presents the ED today for evaluation of chest pain.  Patient states this has been going on off and on in the last 2 months, he describes discrete episodes during which he feels a pressure underneath the sternum that occasionally radiates to his upper black and bilateral shoulders with "numbness.  He reports associated shortness of breath, impending doom, diaphoresis.  He states that the slowly resolve and have not been triggered particularly by exertion though he states that if he were to "run up stairs" he is afraid that he could die.  On chart review, patient has been followed by [facility] cardiology, has had a recent negative nuclear scan but is scheduled for outpatient left heart cath soon.   Chest Pain Pain location:  Substernal area Pain quality: aching   Pain radiates to:  L shoulder, R shoulder and upper back Pain severity:  Moderate Onset quality:  Gradual Duration:  30 minutes Timing:  Intermittent Progression:  Resolved Chronicity:  Recurrent Context: at rest   Associated symptoms: diaphoresis and shortness of breath   Associated symptoms: no abdominal pain, no cough, no dizziness, no fever, no headache, no palpitations and no vomiting        Past Medical History:  Diagnosis Date  . High cholesterol   . Hypertension     Patient Active Problem List   Diagnosis Date Noted  . NSTEMI (non-ST elevated myocardial infarction) ([place]) 02/21/2020    Past Surgical History:  Procedure Laterality Date  . CERVICAL SPINE SURGERY    . ELBOW SURGERY    . KIDNEY STONE SURGERY         No family history on file.  Social History   Tobacco Use  . Smoking status: Never  Smoker  . Smokeless tobacco: Never Used  Substance Use Topics  . Alcohol use: Not on file  . Drug use: Not on file    Home Medications Prior to Admission medications   Medication Sig Start Date End Date Taking? Authorizing Provider  aspirin EC 81 MG tablet Take by mouth. 02/23/14  Yes [provider]  atorvastatin (LIPITOR) 40 MG tablet Take 40 mg by mouth every evening.  07/25/12  Yes [provider]  B Complex Vitamins (VITAMIN B-COMPLEX) TABS Take 1 tablet by mouth daily.    Yes [provider]  clopidogrel (PLAVIX) 75 MG tablet Take 75 mg by mouth daily.   Yes [provider]  Coenzyme Q10 (CO Q-10) 200 MG CAPS Take 1 tablet by mouth daily.   Yes [provider]  glucosamine-chondroitin 500-400 MG tablet Take 1 tablet by mouth daily.   Yes [provider]  hydrochlorothiazide (HYDRODIURIL) 25 MG tablet Take 25 mg by mouth daily.   Yes [provider]  losartan (COZAAR) 100 MG tablet Take 100 mg by mouth daily.   Yes [provider]  Multiple Vitamin (MULTIVITAMIN WITH MINERALS) TABS tablet Take 1 tablet by mouth daily.   Yes [provider]  multivitamin-iron-minerals-folic acid (THERAPEUTIC-M) TABS tablet Take 1 tablet by mouth daily.    Yes [provider]  omeprazole (PRILOSEC) 20 MG capsule Take 20 mg by mouth daily.   Yes [provider]  RA KRILL OIL 500 MG CAPS Take by mouth.   Yes [provider]  Turmeric (QC TUMERIC COMPLEX) 500 MG CAPS Take 1 tablet by mouth daily.   Yes [provider]  Zinc 30 MG CAPS Take 1 capsule by mouth daily.   Yes [provider]  methylPREDNISolone (MEDROL) 4 MG TBPK tablet Take as directed. Patient not taking: Reported on 02/21/2020 09/09/18   Edrick Kins, DPM    Allergies    Patient has no known allergies.  Review of Systems   Review of Systems  Constitutional: Positive for diaphoresis. Negative for chills and fever.   HENT: Negative for facial swelling and voice change.   Eyes: Negative for redness and visual disturbance.  Respiratory: Positive for shortness of breath. Negative for cough.   Cardiovascular: Positive for chest pain. Negative for palpitations.  Gastrointestinal: Negative for abdominal pain and vomiting.  Genitourinary: Negative for difficulty urinating and dysuria.  Musculoskeletal: Negative for gait problem and joint swelling.  Skin: Negative for rash and wound.  Neurological: Negative for dizziness and headaches.  Psychiatric/Behavioral: Negative for confusion and suicidal ideas.    Physical Exam Updated Vital Signs BP 133/77 (BP Location: Right Arm)   Pulse (!) 57   Temp 99 F (37.2 C) (Oral)   Resp 18   Ht 5\' 3"  (1.6 m)   Wt 70 kg   SpO2 98%   BMI 27.35 kg/m   Physical Exam Constitutional:      General: He is not in acute distress.    Appearance: He is obese.  HENT:     Head: Normocephalic and atraumatic.     Mouth/Throat:     Mouth: Mucous membranes are moist.     Pharynx: Oropharynx is clear.  Eyes:     General: No scleral icterus.    Pupils: Pupils are equal, round, and reactive to light.  Neck:     Vascular: No JVD.  Cardiovascular:     Rate and Rhythm: Normal rate and regular rhythm.     Pulses: Normal pulses.     Heart sounds: Murmur heard.  Crescendo decrescendo systolic murmur is present.   Pulmonary:     Effort: Pulmonary effort is normal. No respiratory distress.     Breath sounds: No wheezing or rhonchi.  Abdominal:     General: There is no distension.     Tenderness: There is no abdominal tenderness.  Musculoskeletal:        General: No tenderness or deformity.     Cervical back: Normal range of motion and neck supple.     Right lower leg: Edema present.     Left lower leg: Edema present.     Comments: Trace pitting edema to bilateral lower extremities  Neurological:     General: No focal deficit present.     Mental Status: He is alert and  oriented to person, place, and time.  Psychiatric:        Mood and Affect: Mood normal.        Behavior: Behavior normal.     ED Results / Procedures / Treatments   Labs (all labs ordered are listed, but only abnormal results are displayed) Labs Reviewed  BASIC METABOLIC PANEL - Abnormal; Notable for the following components:      Result Value   Potassium 3.1 (*)    Glucose, Bld 187 (*)    All other components within normal limits  CBC - Abnormal; Notable for the following components:   Platelets 142 (*)  All other components within normal limits  BRAIN NATRIURETIC PEPTIDE - Abnormal; Notable for the following components:   B Natriuretic Peptide 147.0 (*)    All other components within normal limits  TROPONIN I (HIGH SENSITIVITY) - Abnormal; Notable for the following components:   Troponin I (High Sensitivity) 293 (*)    All other components within normal limits  TROPONIN I (HIGH SENSITIVITY) - Abnormal; Notable for the following components:   Troponin I (High Sensitivity) 418 (*)    All other components within normal limits  SARS CORONAVIRUS 2 BY RT PCR (HOSPITAL ORDER, [facility])  MAGNESIUM  HEPARIN LEVEL (UNFRACTIONATED)  CBC  HIV ANTIBODY (ROUTINE TESTING W REFLEX)  HEMOGLOBIN F0Y  BASIC METABOLIC PANEL  LIPID PANEL  PROTIME-INR  CBG MONITORING, ED    EKG EKG Interpretation  Date/Time:  Sunday February 21 2020 19:42:56 EDT Ventricular Rate:  62 PR Interval:    QRS Duration: 137 QT Interval:  420 QTC Calculation: 427 R Axis:   62 Text Interpretation: Sinus rhythm IVCD, consider atypical RBBB Anterior infarct, old Minimal ST elevation, inferior leads When compared with ECG of EARLIER SAME DATE Sinus rhythm has replaced Atrial fibrillation Confirmed by Delora Fuel ([id number]) on 02/21/2020 11:09:20 PM   Radiology DG Chest 2 View  Result Date: 02/21/2020 CLINICAL DATA:  Chest pain, shortness of breath and nausea for 2 months, 2  episodes today, tightness LEFT breast, hypertension, former smoker EXAM: CHEST - 2 VIEW COMPARISON:  None FINDINGS: Normal heart size, mediastinal contours, and pulmonary vascularity. Mild peribronchial thickening and accentuation of perihilar interstitial markings. No pulmonary infiltrate, pleural effusion, or pneumothorax. Endplate spur formation thoracic spine. IMPRESSION: Bronchitic changes without infiltrate. Electronically Signed   By: Lavonia Dana M.D.   On: 02/21/2020 16:34    Procedures Procedures (including critical care time)  Medications Ordered in ED Medications  heparin ADULT infusion 100 units/mL (25000 units/271mL sodium chloride 0.45%) (850 Units/hr Intravenous New Bag/Given 02/21/20 2000)  aspirin EC tablet 81 mg (has no administration in time range)  nitroGLYCERIN (NITROSTAT) SL tablet 0.4 mg (has no administration in time range)  acetaminophen (TYLENOL) tablet 650 mg (has no administration in time range)  ondansetron (ZOFRAN) injection 4 mg (has no administration in time range)  nitroGLYCERIN (NITROGLYN) 2 % ointment 0.5 inch (has no administration in time range)  carvedilol (COREG) tablet 6.25 mg (has no administration in time range)  atorvastatin (LIPITOR) tablet 80 mg (has no administration in time range)  losartan (COZAAR) tablet 100 mg (has no administration in time range)  aspirin chewable tablet 324 mg (324 mg Oral Given 02/21/20 1935)  heparin bolus via infusion 4,000 Units (4,000 Units Intravenous Bolus from Bag 02/21/20 2002)  aspirin chewable tablet 324 mg (324 mg Oral Given 02/21/20 2359)    Or  aspirin suppository 300 mg ( Rectal See Alternative 02/21/20 2359)    ED Course  I have reviewed the triage vital signs and the nursing notes.  Pertinent labs & imaging results that were available during my care of the patient were reviewed by me and considered in my medical decision making (see chart for details).    MDM Rules/Calculators/A&P                           EKG findings by my read: Compared to prior: None.  Rate: 96 rhythm: sinus Axis: appropriate  PR: Prolonged QRS: 132 QTc: 500.  Right bundle branch block pattern consistent  with reads from prior EKGs in Care Everywhere, concerning for atypical ST depressions in anterior and lateral leads.  Differential diagnosis considered: ACS, PE, dissection, pneumothorax, pneumonia, posterior STEMI, COVID-19  Patient presents to ED with a very concerning story of recurrent episodes of chest pain at rest.  These appear to represent typical chest pain in nature however he has not trialed nitroglycerin.  Work-up in the lobby reviewed, concerning for EKG above as well as a positive troponin of 293 making this very concerning for ACS, alternative reasons for cardiac insult considered including PE although the patient is not tachycardic or hypoxic, has no respiratory complaints, no leg swelling or any other concerning findings for PE.  His chest pain has been recurrent with significant periods of being chest pain-free, no neurologic complaints, very low suspicion for an aortic dissection  Chest x-ray as above unremarkable, some changes that could be reflective of bronchitis but no evidence of pneumonia pneumothorax.  Posterior EKG performed without any evidence of elevations low concern for posterior STEMI at this time.  We will minister aspirin, empirically heparinize and touch base with cardiology.  Per cardiology (Dr. Blossom Hoops) assessment, patient's story is quite concerning, they agreed admit the patient to their service will likely undergo left heart cath  Labs reviewed and interpreted by myself with significant findings above. Imaging reviewed by myself and interpreted by radiologist.  Case and plan above discussed with my attending Dr. Dayna Barker  Final Clinical Impression(s) / ED Diagnoses Final diagnoses:  Unstable angina ([place])  NSTEMI (non-ST elevated myocardial infarction) [facility])    Rx / DC Orders ED  Discharge Orders    None     Labs, studies and imaging reviewed by myself and considered in medical decision making if ordered. Imaging interpreted by radiology. Pt was discussed with my attending, Dr. Dayna Barker  Electronically signed by:  Roderic Palau [place]9/13/20211:45 AM       Renold Genta, MD 02/22/20 0145    Merrily Pew, MD 02/22/20 2320

## 2020-02-22 ENCOUNTER — Inpatient Hospital Stay (HOSPITAL_COMMUNITY): Payer: No Typology Code available for payment source

## 2020-02-22 ENCOUNTER — Encounter (HOSPITAL_COMMUNITY): Payer: Self-pay | Admitting: Student

## 2020-02-22 ENCOUNTER — Inpatient Hospital Stay (HOSPITAL_COMMUNITY): Payer: No Typology Code available for payment source | Admitting: Certified Registered"

## 2020-02-22 ENCOUNTER — Encounter (HOSPITAL_COMMUNITY)
Admission: EM | Disposition: A | Discharge status: Home / Self Care | Payer: Self-pay | Source: Home / Self Care | Attending: Thoracic Surgery (Cardiothoracic Vascular Surgery)

## 2020-02-22 DIAGNOSIS — G459 Transient cerebral ischemic attack, unspecified: Secondary | ICD-10-CM

## 2020-02-22 DIAGNOSIS — I2510 Atherosclerotic heart disease of native coronary artery without angina pectoris: Secondary | ICD-10-CM | POA: Diagnosis present

## 2020-02-22 DIAGNOSIS — Z953 Presence of xenogenic heart valve: Secondary | ICD-10-CM

## 2020-02-22 DIAGNOSIS — I214 Non-ST elevation (NSTEMI) myocardial infarction: Secondary | ICD-10-CM

## 2020-02-22 DIAGNOSIS — I200 Unstable angina: Secondary | ICD-10-CM

## 2020-02-22 DIAGNOSIS — Z951 Presence of aortocoronary bypass graft: Secondary | ICD-10-CM

## 2020-02-22 DIAGNOSIS — I350 Nonrheumatic aortic (valve) stenosis: Secondary | ICD-10-CM

## 2020-02-22 DIAGNOSIS — C8290 Follicular lymphoma, unspecified, unspecified site: Secondary | ICD-10-CM | POA: Diagnosis present

## 2020-02-22 DIAGNOSIS — I25110 Atherosclerotic heart disease of native coronary artery with unstable angina pectoris: Principal | ICD-10-CM

## 2020-02-22 HISTORY — DX: Nonrheumatic aortic (valve) stenosis: I35.0

## 2020-02-22 HISTORY — PX: ENDOVEIN HARVEST OF GREATER SAPHENOUS VEIN: SHX5059

## 2020-02-22 HISTORY — PX: LEFT HEART CATH AND CORONARY ANGIOGRAPHY: CATH118249

## 2020-02-22 HISTORY — PX: TEE WITHOUT CARDIOVERSION: SHX5443

## 2020-02-22 HISTORY — PX: AORTIC VALVE REPLACEMENT: SHX41

## 2020-02-22 HISTORY — DX: Presence of xenogenic heart valve: Z95.3

## 2020-02-22 HISTORY — DX: Presence of aortocoronary bypass graft: Z95.1

## 2020-02-22 HISTORY — PX: CORONARY ARTERY BYPASS GRAFT: SHX141

## 2020-02-22 LAB — POCT I-STAT 7, (LYTES, BLD GAS, ICA,H+H)
Acid-Base Excess: 2 mmol/L (ref 0.0–2.0)
Acid-Base Excess: 1 mmol/L (ref 0.0–2.0)
Acid-Base Excess: 3 mmol/L — ABNORMAL HIGH (ref 0.0–2.0)
Acid-Base Excess: 3 mmol/L — ABNORMAL HIGH (ref 0.0–2.0)
Acid-Base Excess: 0 mmol/L (ref 0.0–2.0)
Acid-Base Excess: 1 mmol/L (ref 0.0–2.0)
Acid-base deficit: 2 mmol/L (ref 0.0–2.0)
Bicarbonate: 28 mmol/L (ref 20.0–28.0)
Bicarbonate: 27 mmol/L (ref 20.0–28.0)
Bicarbonate: 27.8 mmol/L (ref 20.0–28.0)
Bicarbonate: 27.7 mmol/L (ref 20.0–28.0)
Bicarbonate: 26.3 mmol/L (ref 20.0–28.0)
Bicarbonate: 26.4 mmol/L (ref 20.0–28.0)
Bicarbonate: 25 mmol/L (ref 20.0–28.0)
Calcium, Ion: 1.23 mmol/L (ref 1.15–1.40)
Calcium, Ion: 0.99 mmol/L — ABNORMAL LOW (ref 1.15–1.40)
Calcium, Ion: 1.13 mmol/L — ABNORMAL LOW (ref 1.15–1.40)
Calcium, Ion: 1.01 mmol/L — ABNORMAL LOW (ref 1.15–1.40)
Calcium, Ion: 1.3 mmol/L (ref 1.15–1.40)
Calcium, Ion: 1.33 mmol/L (ref 1.15–1.40)
Calcium, Ion: 1.2 mmol/L (ref 1.15–1.40)
HCT: 34 % — ABNORMAL LOW (ref 39.0–52.0)
HCT: 24 % — ABNORMAL LOW (ref 39.0–52.0)
HCT: 29 % — ABNORMAL LOW (ref 39.0–52.0)
HCT: 25 % — ABNORMAL LOW (ref 39.0–52.0)
HCT: 19 % — ABNORMAL LOW (ref 39.0–52.0)
HCT: 22 % — ABNORMAL LOW (ref 39.0–52.0)
HCT: 38 % — ABNORMAL LOW (ref 39.0–52.0)
Hemoglobin: 11.6 g/dL — ABNORMAL LOW (ref 13.0–17.0)
Hemoglobin: 8.2 g/dL — ABNORMAL LOW (ref 13.0–17.0)
Hemoglobin: 9.9 g/dL — ABNORMAL LOW (ref 13.0–17.0)
Hemoglobin: 8.5 g/dL — ABNORMAL LOW (ref 13.0–17.0)
Hemoglobin: 6.5 g/dL — CL (ref 13.0–17.0)
Hemoglobin: 7.5 g/dL — ABNORMAL LOW (ref 13.0–17.0)
Hemoglobin: 12.9 g/dL — ABNORMAL LOW (ref 13.0–17.0)
O2 Saturation: 100 %
O2 Saturation: 100 %
O2 Saturation: 99 %
O2 Saturation: 100 %
O2 Saturation: 100 %
O2 Saturation: 100 %
O2 Saturation: 93 %
Patient temperature: 35.8
Patient temperature: 36.1
Potassium: 3.3 mmol/L — ABNORMAL LOW (ref 3.5–5.1)
Potassium: 3.4 mmol/L — ABNORMAL LOW (ref 3.5–5.1)
Potassium: 3.5 mmol/L (ref 3.5–5.1)
Potassium: 4 mmol/L (ref 3.5–5.1)
Potassium: 3.4 mmol/L — ABNORMAL LOW (ref 3.5–5.1)
Potassium: 3.8 mmol/L (ref 3.5–5.1)
Potassium: 3.6 mmol/L (ref 3.5–5.1)
Sodium: 143 mmol/L (ref 135–145)
Sodium: 143 mmol/L (ref 135–145)
Sodium: 143 mmol/L (ref 135–145)
Sodium: 140 mmol/L (ref 135–145)
Sodium: 142 mmol/L (ref 135–145)
Sodium: 142 mmol/L (ref 135–145)
Sodium: 143 mmol/L (ref 135–145)
TCO2: 30 mmol/L (ref 22–32)
TCO2: 28 mmol/L (ref 22–32)
TCO2: 29 mmol/L (ref 22–32)
TCO2: 29 mmol/L (ref 22–32)
TCO2: 28 mmol/L (ref 22–32)
TCO2: 28 mmol/L (ref 22–32)
TCO2: 27 mmol/L (ref 22–32)
pCO2 arterial: 51.5 mmHg — ABNORMAL HIGH (ref 32.0–48.0)
pCO2 arterial: 44 mmHg (ref 32.0–48.0)
pCO2 arterial: 44.1 mmHg (ref 32.0–48.0)
pCO2 arterial: 44.2 mmHg (ref 32.0–48.0)
pCO2 arterial: 48.2 mmHg — ABNORMAL HIGH (ref 32.0–48.0)
pCO2 arterial: 51.3 mmHg — ABNORMAL HIGH (ref 32.0–48.0)
pCO2 arterial: 51.1 mmHg — ABNORMAL HIGH (ref 32.0–48.0)
pH, Arterial: 7.343 — ABNORMAL LOW (ref 7.350–7.450)
pH, Arterial: 7.39 (ref 7.350–7.450)
pH, Arterial: 7.408 (ref 7.350–7.450)
pH, Arterial: 7.404 (ref 7.350–7.450)
pH, Arterial: 7.318 — ABNORMAL LOW (ref 7.350–7.450)
pH, Arterial: 7.347 — ABNORMAL LOW (ref 7.350–7.450)
pH, Arterial: 7.292 — ABNORMAL LOW (ref 7.350–7.450)
pO2, Arterial: 370 mmHg — ABNORMAL HIGH (ref 83.0–108.0)
pO2, Arterial: 127 mmHg — ABNORMAL HIGH (ref 83.0–108.0)
pO2, Arterial: 244 mmHg — ABNORMAL HIGH (ref 83.0–108.0)
pO2, Arterial: 296 mmHg — ABNORMAL HIGH (ref 83.0–108.0)
pO2, Arterial: 446 mmHg — ABNORMAL HIGH (ref 83.0–108.0)
pO2, Arterial: 469 mmHg — ABNORMAL HIGH (ref 83.0–108.0)
pO2, Arterial: 74 mmHg — ABNORMAL LOW (ref 83.0–108.0)

## 2020-02-22 LAB — ECHOCARDIOGRAM COMPLETE
AR max vel: 1.43 cm2
AV Area VTI: 1.48 cm2
AV Area mean vel: 1.54 cm2
AV Mean grad: 13 mmHg
AV Peak grad: 24 mmHg
Ao pk vel: 2.45 m/s
Area-P 1/2: 2.8 cm2
Height: 63 in
S' Lateral: 2.3 cm
Weight: 2475.2 oz

## 2020-02-22 LAB — POCT I-STAT, CHEM 8
BUN: 12 mg/dL (ref 8–23)
BUN: 12 mg/dL (ref 8–23)
BUN: 11 mg/dL (ref 8–23)
BUN: 11 mg/dL (ref 8–23)
BUN: 10 mg/dL (ref 8–23)
BUN: 10 mg/dL (ref 8–23)
BUN: 9 mg/dL (ref 8–23)
Calcium, Ion: 1.21 mmol/L (ref 1.15–1.40)
Calcium, Ion: 1.24 mmol/L (ref 1.15–1.40)
Calcium, Ion: 1.22 mmol/L (ref 1.15–1.40)
Calcium, Ion: 1.05 mmol/L — ABNORMAL LOW (ref 1.15–1.40)
Calcium, Ion: 0.97 mmol/L — ABNORMAL LOW (ref 1.15–1.40)
Calcium, Ion: 1.48 mmol/L — ABNORMAL HIGH (ref 1.15–1.40)
Calcium, Ion: 1.29 mmol/L (ref 1.15–1.40)
Chloride: 103 mmol/L (ref 98–111)
Chloride: 104 mmol/L (ref 98–111)
Chloride: 103 mmol/L (ref 98–111)
Chloride: 102 mmol/L (ref 98–111)
Chloride: 103 mmol/L (ref 98–111)
Chloride: 105 mmol/L (ref 98–111)
Chloride: 106 mmol/L (ref 98–111)
Creatinine, Ser: 0.7 mg/dL (ref 0.61–1.24)
Creatinine, Ser: 0.7 mg/dL (ref 0.61–1.24)
Creatinine, Ser: 0.7 mg/dL (ref 0.61–1.24)
Creatinine, Ser: 0.7 mg/dL (ref 0.61–1.24)
Creatinine, Ser: 0.5 mg/dL — ABNORMAL LOW (ref 0.61–1.24)
Creatinine, Ser: 0.6 mg/dL — ABNORMAL LOW (ref 0.61–1.24)
Creatinine, Ser: 0.5 mg/dL — ABNORMAL LOW (ref 0.61–1.24)
Glucose, Bld: 107 mg/dL — ABNORMAL HIGH (ref 70–99)
Glucose, Bld: 113 mg/dL — ABNORMAL HIGH (ref 70–99)
Glucose, Bld: 101 mg/dL — ABNORMAL HIGH (ref 70–99)
Glucose, Bld: 116 mg/dL — ABNORMAL HIGH (ref 70–99)
Glucose, Bld: 128 mg/dL — ABNORMAL HIGH (ref 70–99)
Glucose, Bld: 140 mg/dL — ABNORMAL HIGH (ref 70–99)
Glucose, Bld: 120 mg/dL — ABNORMAL HIGH (ref 70–99)
HCT: 30 % — ABNORMAL LOW (ref 39.0–52.0)
HCT: 31 % — ABNORMAL LOW (ref 39.0–52.0)
HCT: 28 % — ABNORMAL LOW (ref 39.0–52.0)
HCT: 26 % — ABNORMAL LOW (ref 39.0–52.0)
HCT: 17 % — ABNORMAL LOW (ref 39.0–52.0)
HCT: 24 % — ABNORMAL LOW (ref 39.0–52.0)
HCT: 23 % — ABNORMAL LOW (ref 39.0–52.0)
Hemoglobin: 10.2 g/dL — ABNORMAL LOW (ref 13.0–17.0)
Hemoglobin: 10.5 g/dL — ABNORMAL LOW (ref 13.0–17.0)
Hemoglobin: 9.5 g/dL — ABNORMAL LOW (ref 13.0–17.0)
Hemoglobin: 8.8 g/dL — ABNORMAL LOW (ref 13.0–17.0)
Hemoglobin: 5.8 g/dL — CL (ref 13.0–17.0)
Hemoglobin: 8.2 g/dL — ABNORMAL LOW (ref 13.0–17.0)
Hemoglobin: 7.8 g/dL — ABNORMAL LOW (ref 13.0–17.0)
Potassium: 3.3 mmol/L — ABNORMAL LOW (ref 3.5–5.1)
Potassium: 3.3 mmol/L — ABNORMAL LOW (ref 3.5–5.1)
Potassium: 3.2 mmol/L — ABNORMAL LOW (ref 3.5–5.1)
Potassium: 4.2 mmol/L (ref 3.5–5.1)
Potassium: 4 mmol/L (ref 3.5–5.1)
Potassium: 4.3 mmol/L (ref 3.5–5.1)
Potassium: 3.4 mmol/L — ABNORMAL LOW (ref 3.5–5.1)
Sodium: 141 mmol/L (ref 135–145)
Sodium: 141 mmol/L (ref 135–145)
Sodium: 141 mmol/L (ref 135–145)
Sodium: 140 mmol/L (ref 135–145)
Sodium: 139 mmol/L (ref 135–145)
Sodium: 140 mmol/L (ref 135–145)
Sodium: 142 mmol/L (ref 135–145)
TCO2: 25 mmol/L (ref 22–32)
TCO2: 26 mmol/L (ref 22–32)
TCO2: 23 mmol/L (ref 22–32)
TCO2: 26 mmol/L (ref 22–32)
TCO2: 24 mmol/L (ref 22–32)
TCO2: 25 mmol/L (ref 22–32)
TCO2: 24 mmol/L (ref 22–32)

## 2020-02-22 LAB — HEMOGLOBIN AND HEMATOCRIT, BLOOD
HCT: 24.2 % — ABNORMAL LOW (ref 39.0–52.0)
Hemoglobin: 8.4 g/dL — ABNORMAL LOW (ref 13.0–17.0)

## 2020-02-22 LAB — BASIC METABOLIC PANEL
Anion gap: 9 (ref 5–15)
BUN: 12 mg/dL (ref 8–23)
CO2: 30 mmol/L (ref 22–32)
Calcium: 9.2 mg/dL (ref 8.9–10.3)
Chloride: 103 mmol/L (ref 98–111)
Creatinine, Ser: 0.92 mg/dL (ref 0.61–1.24)
GFR calc Af Amer: >60 mL/min (ref >60)
GFR calc non Af Amer: >60 mL/min (ref >60)
Glucose, Bld: 137 mg/dL — ABNORMAL HIGH (ref 70–99)
Potassium: 3.8 mmol/L (ref 3.5–5.1)
Sodium: 142 mmol/L (ref 135–145)

## 2020-02-22 LAB — CBC
HCT: 38.6 % — ABNORMAL LOW (ref 39.0–52.0)
HCT: 41.1 % (ref 39.0–52.0)
Hemoglobin: 13.2 g/dL (ref 13.0–17.0)
Hemoglobin: 13.7 g/dL (ref 13.0–17.0)
MCH: 31.3 pg (ref 26.0–34.0)
MCH: 30.6 pg (ref 26.0–34.0)
MCHC: 34.2 g/dL (ref 30.0–36.0)
MCHC: 33.3 g/dL (ref 30.0–36.0)
MCV: 91.5 fL (ref 80.0–100.0)
MCV: 91.9 fL (ref 80.0–100.0)
Platelets: 136 10*3/uL — ABNORMAL LOW (ref 150–400)
Platelets: 128 10*3/uL — ABNORMAL LOW (ref 150–400)
RBC: 4.22 MIL/uL (ref 4.22–5.81)
RBC: 4.47 MIL/uL (ref 4.22–5.81)
RDW: 13 % (ref 11.5–15.5)
RDW: 13.5 % (ref 11.5–15.5)
WBC: 7.4 10*3/uL (ref 4.0–10.5)
WBC: 18 10*3/uL — ABNORMAL HIGH (ref 4.0–10.5)
nRBC: 0 % (ref 0.0–0.2)
nRBC: 0 % (ref 0.0–0.2)

## 2020-02-22 LAB — PROTIME-INR
INR: 1.2 (ref 0.8–1.2)
INR: 1.7 — ABNORMAL HIGH (ref 0.8–1.2)
Prothrombin Time: 14.6 seconds (ref 11.4–15.2)
Prothrombin Time: 18.9 seconds — ABNORMAL HIGH (ref 11.4–15.2)

## 2020-02-22 LAB — LIPID PANEL
Cholesterol: 151 mg/dL (ref 0–200)
HDL: 37 mg/dL — ABNORMAL LOW (ref >40)
LDL Cholesterol: 84 mg/dL (ref 0–99)
Total CHOL/HDL Ratio: 4.1 RATIO
Triglycerides: 152 mg/dL — ABNORMAL HIGH (ref <150)
VLDL: 30 mg/dL (ref 0–40)

## 2020-02-22 LAB — APTT: aPTT: 38 seconds — ABNORMAL HIGH (ref 24–36)

## 2020-02-22 LAB — HEMOGLOBIN A1C
Hgb A1c MFr Bld: 5.3 % (ref 4.8–5.6)
Mean Plasma Glucose: 105.41 mg/dL

## 2020-02-22 LAB — ABO/RH: ABO/RH(D): O POS

## 2020-02-22 LAB — HIV ANTIBODY (ROUTINE TESTING W REFLEX): HIV Screen 4th Generation wRfx: NONREACTIVE

## 2020-02-22 LAB — PLATELET COUNT: Platelets: 127 10*3/uL — ABNORMAL LOW (ref 150–400)

## 2020-02-22 LAB — GLUCOSE, CAPILLARY
Glucose-Capillary: 136 mg/dL — ABNORMAL HIGH (ref 70–99)
Glucose-Capillary: 152 mg/dL — ABNORMAL HIGH (ref 70–99)

## 2020-02-22 LAB — HEPARIN LEVEL (UNFRACTIONATED): Heparin Unfractionated: 0.4 IU/mL (ref 0.30–0.70)

## 2020-02-22 LAB — PREPARE RBC (CROSSMATCH)

## 2020-02-22 IMAGING — DX DG CHEST 1V PORT
1 series · 1 of 1 positions shown · non-contrast
Comparison: [DATE]

CLINICAL DATA: Status post aortic valve replacement

EXAM:
PORTABLE CHEST 1 VIEW

[chest]
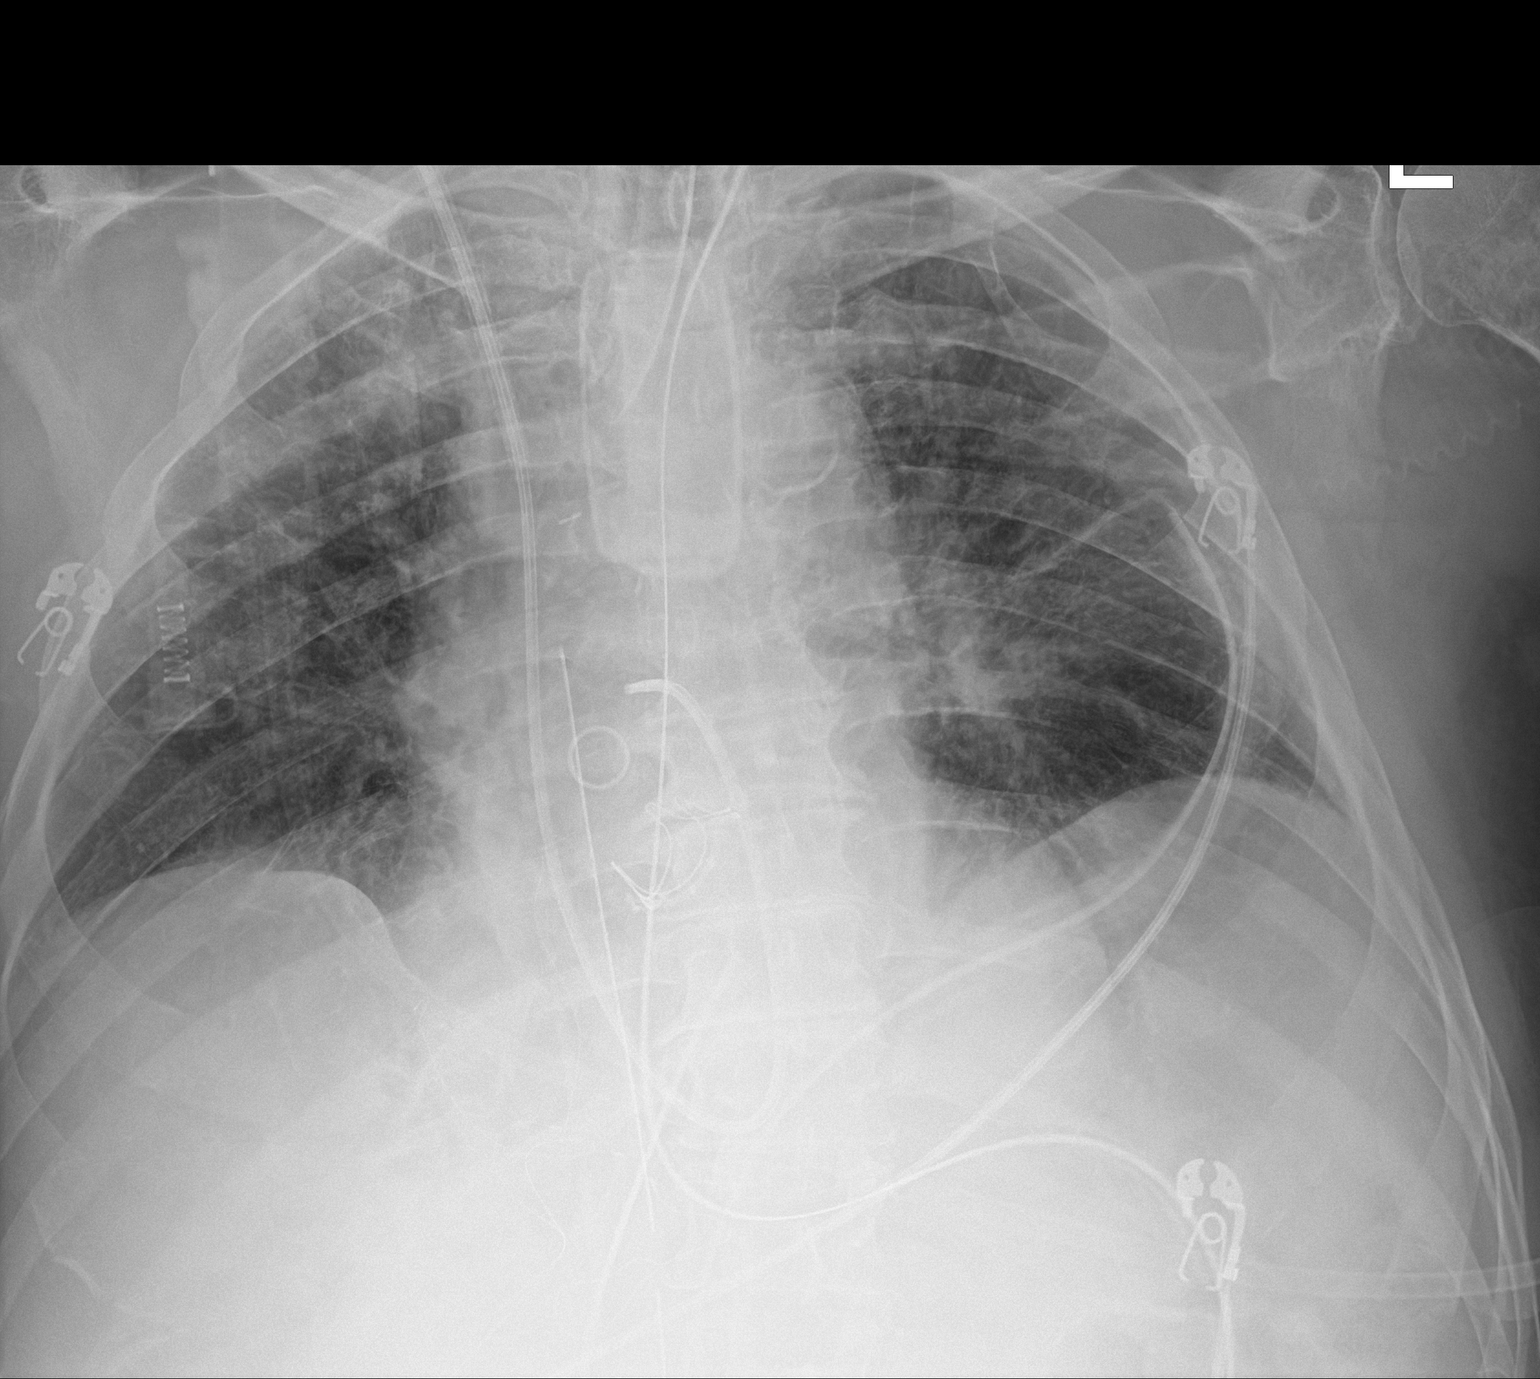

[1 of 1 positions shown; findings below may reference images not displayed]

FINDINGS: Support Apparatus:

--Endotracheal tube: Tip just below the level of the clavicular
heads.

--Enteric tube:Tip and sideport are below the field of view.

--Catheter(s):Right IJ approach Swan-Ganz catheter tip projects over
the main pulmonary artery.

--Other: Mediastinal drains and left chest tube are present.

Lingular atelectasis. No pneumothorax. No sizable pleural effusion.
IMPRESSION: Satisfactory appearance of postoperative support apparatus.
Atelectasis in the lingula.

## 2020-02-22 SURGERY — LEFT HEART CATH AND CORONARY ANGIOGRAPHY
Anesthesia: LOCAL

## 2020-02-22 SURGERY — CORONARY ARTERY BYPASS GRAFTING (CABG)
Anesthesia: General | Site: Chest

## 2020-02-22 SURGERY — CORONARY ARTERY BYPASS GRAFTING (CABG)
Anesthesia: General | Site: Leg Upper | Laterality: Right

## 2020-02-22 MED ORDER — SODIUM CHLORIDE 0.45 % IV SOLN: INTRAVENOUS | Status: DC | PRN

## 2020-02-22 MED ORDER — FENTANYL CITRATE (PF) 250 MCG/5ML IJ SOLN
INTRAMUSCULAR | Status: AC
  Filled 2020-02-22: qty 20

## 2020-02-22 MED ORDER — VANCOMYCIN HCL IN DEXTROSE 1-5 GM/200ML-% IV SOLN
1000.0000 mg | Freq: Once | INTRAVENOUS | Status: AC
  Administered 2020-02-23: 1000 mg via INTRAVENOUS
  Filled 2020-02-22: qty 200

## 2020-02-22 MED ORDER — MAGNESIUM SULFATE 4 GM/100ML IV SOLN
4.0000 g | Freq: Once | INTRAVENOUS | Status: AC
  Administered 2020-02-22: 4 g via INTRAVENOUS
  Filled 2020-02-22: qty 100

## 2020-02-22 MED ORDER — INSULIN REGULAR(HUMAN) IN NACL 100-0.9 UT/100ML-% IV SOLN
INTRAVENOUS | Status: AC
  Administered 2020-02-22: .8 [IU]/h via INTRAVENOUS
  Filled 2020-02-22: qty 100

## 2020-02-22 MED ORDER — TRANEXAMIC ACID 1000 MG/10ML IV SOLN
1.5000 mg/kg/h | INTRAVENOUS | Status: AC
  Administered 2020-02-22: 1.5 mg/kg/h via INTRAVENOUS
  Filled 2020-02-22: qty 25

## 2020-02-22 MED ORDER — FAMOTIDINE IN NACL 20-0.9 MG/50ML-% IV SOLN
20.0000 mg | Freq: Two times a day (BID) | INTRAVENOUS | Status: AC
  Filled 2020-02-22 (×2): qty 50

## 2020-02-22 MED ORDER — HEPARIN SODIUM (PORCINE) 1000 UNIT/ML IJ SOLN
INTRAMUSCULAR | Status: DC | PRN
  Administered 2020-02-22: 20000 [IU] via INTRAVENOUS

## 2020-02-22 MED ORDER — SODIUM CHLORIDE 0.9% FLUSH: 3.0000 mL | INTRAVENOUS | Status: DC | PRN

## 2020-02-22 MED ORDER — FAMOTIDINE IN NACL 20-0.9 MG/50ML-% IV SOLN
INTRAVENOUS | Status: AC
  Administered 2020-02-22: 20 mg via INTRAVENOUS
  Filled 2020-02-22: qty 50

## 2020-02-22 MED ORDER — INSULIN REGULAR(HUMAN) IN NACL 100-0.9 UT/100ML-% IV SOLN: INTRAVENOUS | Status: DC

## 2020-02-22 MED ORDER — CHLORHEXIDINE GLUCONATE CLOTH 2 % EX PADS
6.0000 | MEDICATED_PAD | Freq: Every day | CUTANEOUS | Status: DC
  Administered 2020-02-23: 6 via TOPICAL

## 2020-02-22 MED ORDER — LIDOCAINE HCL (PF) 1 % IJ SOLN
INTRAMUSCULAR | Status: DC | PRN
  Administered 2020-02-22: 2 mL via INTRADERMAL

## 2020-02-22 MED ORDER — VANCOMYCIN HCL 1000 MG IV SOLR
INTRAVENOUS | Status: DC
  Filled 2020-02-22: qty 1000

## 2020-02-22 MED ORDER — VERAPAMIL HCL 2.5 MG/ML IV SOLN
INTRAVENOUS | Status: DC | PRN
  Administered 2020-02-22: 10 mL via INTRA_ARTERIAL

## 2020-02-22 MED ORDER — ROCURONIUM BROMIDE 10 MG/ML (PF) SYRINGE
PREFILLED_SYRINGE | INTRAVENOUS | Status: AC
  Filled 2020-02-22: qty 10

## 2020-02-22 MED ORDER — SODIUM CHLORIDE 0.9 % IV SOLN
1.5000 g | INTRAVENOUS | Status: AC
  Administered 2020-02-22: 1.5 g via INTRAVENOUS
  Filled 2020-02-22: qty 1.5

## 2020-02-22 MED ORDER — PROPOFOL 10 MG/ML IV BOLUS
INTRAVENOUS | Status: DC | PRN
  Administered 2020-02-22: 40 mg via INTRAVENOUS
  Administered 2020-02-22: 20 mg via INTRAVENOUS

## 2020-02-22 MED ORDER — POTASSIUM CHLORIDE 10 MEQ/50ML IV SOLN
10.0000 meq | INTRAVENOUS | Status: AC
  Administered 2020-02-22 – 2020-02-23 (×3): 10 meq via INTRAVENOUS

## 2020-02-22 MED ORDER — PROTAMINE SULFATE 10 MG/ML IV SOLN
INTRAVENOUS | Status: DC | PRN
  Administered 2020-02-22: 50 mg via INTRAVENOUS
  Administered 2020-02-22: 100 mg via INTRAVENOUS
  Administered 2020-02-22: 50 mg via INTRAVENOUS

## 2020-02-22 MED ORDER — OXYCODONE HCL 5 MG PO TABS
5.0000 mg | ORAL_TABLET | ORAL | Status: DC | PRN
  Administered 2020-02-23 – 2020-02-24 (×6): 5 mg via ORAL
  Filled 2020-02-22 (×6): qty 1

## 2020-02-22 MED ORDER — PHENYLEPHRINE HCL-NACL 20-0.9 MG/250ML-% IV SOLN
0.0000 ug/min | INTRAVENOUS | Status: DC
  Administered 2020-02-23: 0 ug/min via INTRAVENOUS

## 2020-02-22 MED ORDER — FENTANYL CITRATE (PF) 100 MCG/2ML IJ SOLN
INTRAMUSCULAR | Status: DC | PRN
Start: 2020-02-22 — End: 2020-02-22
  Administered 2020-02-22 (×2): 50 ug via INTRAVENOUS

## 2020-02-22 MED ORDER — NITROGLYCERIN IN D5W 200-5 MCG/ML-% IV SOLN
INTRAVENOUS | Status: AC | PRN
  Administered 2020-02-22: 20 ug/min via INTRAVENOUS

## 2020-02-22 MED ORDER — BISACODYL 5 MG PO TBEC
10.0000 mg | DELAYED_RELEASE_TABLET | Freq: Every day | ORAL | Status: DC
  Administered 2020-02-23 – 2020-02-25 (×3): 10 mg via ORAL
  Filled 2020-02-22 (×3): qty 2

## 2020-02-22 MED ORDER — NITROGLYCERIN 0.4 MG SL SUBL
SUBLINGUAL_TABLET | SUBLINGUAL | Status: DC | PRN
  Administered 2020-02-22: .4 mg via SUBLINGUAL

## 2020-02-22 MED ORDER — HEPARIN (PORCINE) IN NACL 1000-0.9 UT/500ML-% IV SOLN
INTRAVENOUS | Status: AC
  Filled 2020-02-22: qty 500

## 2020-02-22 MED ORDER — ARTIFICIAL TEARS OPHTHALMIC OINT
TOPICAL_OINTMENT | OPHTHALMIC | Status: AC
  Filled 2020-02-22: qty 3.5

## 2020-02-22 MED ORDER — POTASSIUM CHLORIDE 2 MEQ/ML IV SOLN
80.0000 meq | INTRAVENOUS | Status: DC
  Filled 2020-02-22: qty 40

## 2020-02-22 MED ORDER — ALBUMIN HUMAN 5 % IV SOLN
250.0000 mL | INTRAVENOUS | Status: AC | PRN
  Administered 2020-02-23 (×3): 12.5 g via INTRAVENOUS
  Filled 2020-02-22: qty 250

## 2020-02-22 MED ORDER — SODIUM CHLORIDE 0.9 % IR SOLN
Status: DC | PRN
  Administered 2020-02-22: 5000 mL

## 2020-02-22 MED ORDER — LACTATED RINGERS IV SOLN: INTRAVENOUS | Status: DC

## 2020-02-22 MED ORDER — HEPARIN (PORCINE) IN NACL 1000-0.9 UT/500ML-% IV SOLN
INTRAVENOUS | Status: AC
  Filled 2020-02-22: qty 1000

## 2020-02-22 MED ORDER — TRANEXAMIC ACID (OHS) PUMP PRIME SOLUTION
2.0000 mg/kg | INTRAVENOUS | Status: DC
  Filled 2020-02-22: qty 1.4

## 2020-02-22 MED ORDER — VANCOMYCIN HCL 1250 MG/250ML IV SOLN
1250.0000 mg | INTRAVENOUS | Status: AC
  Administered 2020-02-22: 1250 mg via INTRAVENOUS
  Filled 2020-02-22: qty 250

## 2020-02-22 MED ORDER — MIDAZOLAM HCL 2 MG/2ML IJ SOLN
INTRAMUSCULAR | Status: DC | PRN
  Administered 2020-02-22: 1 mg via INTRAVENOUS

## 2020-02-22 MED ORDER — FENTANYL CITRATE (PF) 100 MCG/2ML IJ SOLN
INTRAMUSCULAR | Status: AC
  Filled 2020-02-22: qty 2

## 2020-02-22 MED ORDER — ROCURONIUM BROMIDE 10 MG/ML (PF) SYRINGE
PREFILLED_SYRINGE | INTRAVENOUS | Status: DC | PRN
  Administered 2020-02-22 (×2): 50 mg via INTRAVENOUS
  Administered 2020-02-22: 100 mg via INTRAVENOUS

## 2020-02-22 MED ORDER — LACTATED RINGERS IV SOLN: INTRAVENOUS | Status: DC | PRN

## 2020-02-22 MED ORDER — NOREPINEPHRINE 4 MG/250ML-% IV SOLN
0.0000 ug/min | INTRAVENOUS | Status: AC
  Administered 2020-02-22: 5 ug/min via INTRAVENOUS
  Filled 2020-02-22: qty 250

## 2020-02-22 MED ORDER — SODIUM CHLORIDE 0.9 % IV SOLN
750.0000 mg | INTRAVENOUS | Status: AC
  Administered 2020-02-22: 750 mg via INTRAVENOUS
  Filled 2020-02-22: qty 750

## 2020-02-22 MED ORDER — PANTOPRAZOLE SODIUM 40 MG PO TBEC
40.0000 mg | DELAYED_RELEASE_TABLET | Freq: Every day | ORAL | Status: DC
  Administered 2020-02-24 – 2020-02-26 (×3): 40 mg via ORAL
  Filled 2020-02-22 (×3): qty 1

## 2020-02-22 MED ORDER — ACETAMINOPHEN 160 MG/5ML PO SOLN: 1000.0000 mg | Freq: Four times a day (QID) | ORAL | Status: DC

## 2020-02-22 MED ORDER — HEPARIN SODIUM (PORCINE) 1000 UNIT/ML IJ SOLN
INTRAMUSCULAR | Status: DC | PRN
  Administered 2020-02-22: 4000 [IU] via INTRAVENOUS

## 2020-02-22 MED ORDER — SODIUM CHLORIDE 0.9 % WEIGHT BASED INFUSION: 1.0000 mL/kg/h | INTRAVENOUS | Status: DC

## 2020-02-22 MED ORDER — MAGNESIUM SULFATE 50 % IJ SOLN
40.0000 meq | INTRAMUSCULAR | Status: DC
  Filled 2020-02-22: qty 9.85

## 2020-02-22 MED ORDER — MIDAZOLAM HCL 2 MG/2ML IJ SOLN
INTRAMUSCULAR | Status: AC
  Filled 2020-02-22: qty 2

## 2020-02-22 MED ORDER — ONDANSETRON HCL 4 MG/2ML IJ SOLN
4.0000 mg | Freq: Four times a day (QID) | INTRAMUSCULAR | Status: DC | PRN
  Administered 2020-02-23 – 2020-02-25 (×2): 4 mg via INTRAVENOUS
  Filled 2020-02-22 (×2): qty 2

## 2020-02-22 MED ORDER — ASPIRIN EC 325 MG PO TBEC: 325.0000 mg | DELAYED_RELEASE_TABLET | Freq: Every day | ORAL | Status: DC

## 2020-02-22 MED ORDER — PHENYLEPHRINE HCL-NACL 20-0.9 MG/250ML-% IV SOLN
30.0000 ug/min | INTRAVENOUS | Status: AC
  Administered 2020-02-22: 25 ug/min via INTRAVENOUS
  Filled 2020-02-22: qty 250

## 2020-02-22 MED ORDER — PROPOFOL 10 MG/ML IV BOLUS
INTRAVENOUS | Status: AC
  Filled 2020-02-22: qty 20

## 2020-02-22 MED ORDER — DEXMEDETOMIDINE HCL IN NACL 400 MCG/100ML IV SOLN
0.0000 ug/kg/h | INTRAVENOUS | Status: DC
  Administered 2020-02-22: 0.7 ug/kg/h via INTRAVENOUS
  Administered 2020-02-23: 0.2 ug/kg/h via INTRAVENOUS
  Filled 2020-02-22: qty 100

## 2020-02-22 MED ORDER — DEXMEDETOMIDINE HCL IN NACL 400 MCG/100ML IV SOLN
0.1000 ug/kg/h | INTRAVENOUS | Status: AC
  Administered 2020-02-22: .3 ug/kg/h via INTRAVENOUS
  Filled 2020-02-22: qty 100

## 2020-02-22 MED ORDER — SODIUM CHLORIDE (PF) 0.9 % IJ SOLN
INTRAMUSCULAR | Status: AC
  Filled 2020-02-22: qty 20

## 2020-02-22 MED ORDER — NITROGLYCERIN IN D5W 200-5 MCG/ML-% IV SOLN
0.0000 ug/min | INTRAVENOUS | Status: DC
  Administered 2020-02-23: 0 ug/min via INTRAVENOUS

## 2020-02-22 MED ORDER — SODIUM CHLORIDE 0.9 % IV SOLN: INTRAVENOUS | Status: DC | PRN

## 2020-02-22 MED ORDER — VANCOMYCIN HCL 1000 MG IV SOLR
INTRAVENOUS | Status: DC | PRN
  Administered 2020-02-22: 1000 mL

## 2020-02-22 MED ORDER — SODIUM CHLORIDE 0.9 % IV SOLN
INTRAVENOUS | Status: AC | PRN
  Administered 2020-02-22: 300 mL via INTRAVENOUS

## 2020-02-22 MED ORDER — SODIUM CHLORIDE 0.9 % IV SOLN
INTRAVENOUS | Status: DC
  Filled 2020-02-22: qty 30

## 2020-02-22 MED ORDER — MIDAZOLAM HCL 2 MG/2ML IJ SOLN
2.0000 mg | INTRAMUSCULAR | Status: DC | PRN
  Administered 2020-02-22: 2 mg via INTRAVENOUS
  Filled 2020-02-22: qty 2

## 2020-02-22 MED ORDER — SODIUM CHLORIDE 0.9% FLUSH
3.0000 mL | Freq: Two times a day (BID) | INTRAVENOUS | Status: DC
  Administered 2020-02-23 – 2020-02-25 (×4): 3 mL via INTRAVENOUS

## 2020-02-22 MED ORDER — TRANEXAMIC ACID (OHS) BOLUS VIA INFUSION
15.0000 mg/kg | INTRAVENOUS | Status: AC
  Administered 2020-02-22: 1053 mg via INTRAVENOUS
  Filled 2020-02-22: qty 1053

## 2020-02-22 MED ORDER — GLYCOPYRROLATE PF 0.2 MG/ML IJ SOSY
PREFILLED_SYRINGE | INTRAMUSCULAR | Status: DC | PRN
  Administered 2020-02-22: .2 mg via INTRAVENOUS

## 2020-02-22 MED ORDER — DEXTROSE 50 % IV SOLN: 0.0000 mL | INTRAVENOUS | Status: DC | PRN

## 2020-02-22 MED ORDER — LIDOCAINE 2% (20 MG/ML) 5 ML SYRINGE
INTRAMUSCULAR | Status: DC | PRN
  Administered 2020-02-22: 60 mg via INTRAVENOUS

## 2020-02-22 MED ORDER — PHENYLEPHRINE HCL (PRESSORS) 10 MG/ML IV SOLN: INTRAVENOUS | Status: DC | PRN

## 2020-02-22 MED ORDER — FENTANYL CITRATE (PF) 250 MCG/5ML IJ SOLN
INTRAMUSCULAR | Status: DC | PRN
Start: 2020-02-22 — End: 2020-02-22
  Administered 2020-02-22: 150 ug via INTRAVENOUS
  Administered 2020-02-22 (×3): 250 ug via INTRAVENOUS
  Administered 2020-02-22: 100 ug via INTRAVENOUS

## 2020-02-22 MED ORDER — PLASMA-LYTE 148 IV SOLN
INTRAVENOUS | Status: DC
  Filled 2020-02-22: qty 2.5

## 2020-02-22 MED ORDER — FENTANYL CITRATE (PF) 100 MCG/2ML IJ SOLN
INTRAMUSCULAR | Status: AC
  Administered 2020-02-23: 100 ug
  Filled 2020-02-22: qty 2

## 2020-02-22 MED ORDER — PHENYLEPHRINE 40 MCG/ML (10ML) SYRINGE FOR IV PUSH (FOR BLOOD PRESSURE SUPPORT)
PREFILLED_SYRINGE | INTRAVENOUS | Status: AC
  Filled 2020-02-22: qty 10

## 2020-02-22 MED ORDER — NITROGLYCERIN 0.4 MG SL SUBL
SUBLINGUAL_TABLET | SUBLINGUAL | Status: AC
  Filled 2020-02-22: qty 1

## 2020-02-22 MED ORDER — ASPIRIN 81 MG PO CHEW: 81.0000 mg | CHEWABLE_TABLET | ORAL | Status: DC

## 2020-02-22 MED ORDER — LIDOCAINE HCL (PF) 1 % IJ SOLN
INTRAMUSCULAR | Status: AC
  Filled 2020-02-22: qty 30

## 2020-02-22 MED ORDER — DOCUSATE SODIUM 100 MG PO CAPS
200.0000 mg | ORAL_CAPSULE | Freq: Every day | ORAL | Status: DC
  Administered 2020-02-23 – 2020-02-25 (×3): 200 mg via ORAL
  Filled 2020-02-22 (×3): qty 2

## 2020-02-22 MED ORDER — MILRINONE LACTATE IN DEXTROSE 20-5 MG/100ML-% IV SOLN
0.3000 ug/kg/min | INTRAVENOUS | Status: DC
  Filled 2020-02-22: qty 100

## 2020-02-22 MED ORDER — ACETAMINOPHEN 500 MG PO TABS
1000.0000 mg | ORAL_TABLET | Freq: Four times a day (QID) | ORAL | Status: DC
  Administered 2020-02-23 – 2020-02-26 (×11): 1000 mg via ORAL
  Filled 2020-02-22 (×11): qty 2

## 2020-02-22 MED ORDER — PLASMA-LYTE 148 IV SOLN
INTRAVENOUS | Status: DC | PRN
  Administered 2020-02-22: 500 mL

## 2020-02-22 MED ORDER — SODIUM CHLORIDE 0.9 % IV SOLN: INTRAVENOUS | Status: DC

## 2020-02-22 MED ORDER — METOPROLOL TARTRATE 12.5 MG HALF TABLET: 12.5000 mg | ORAL_TABLET | Freq: Two times a day (BID) | ORAL | Status: DC

## 2020-02-22 MED ORDER — MIDAZOLAM HCL 5 MG/5ML IJ SOLN
INTRAMUSCULAR | Status: DC | PRN
  Administered 2020-02-22: 2 mg via INTRAVENOUS
  Administered 2020-02-22: 1 mg via INTRAVENOUS
  Administered 2020-02-22: 3 mg via INTRAVENOUS
  Administered 2020-02-22: 1 mg via INTRAVENOUS
  Administered 2020-02-22: 3 mg via INTRAVENOUS

## 2020-02-22 MED ORDER — NITROGLYCERIN IN D5W 200-5 MCG/ML-% IV SOLN
2.0000 ug/min | INTRAVENOUS | Status: AC
  Administered 2020-02-22: 20 ug/min via INTRAVENOUS
  Filled 2020-02-22: qty 250

## 2020-02-22 MED ORDER — SODIUM CHLORIDE 0.9 % WEIGHT BASED INFUSION
3.0000 mL/kg/h | INTRAVENOUS | Status: DC
  Administered 2020-02-22: 3 mL/kg/h via INTRAVENOUS

## 2020-02-22 MED ORDER — SODIUM CHLORIDE 0.9 % IV SOLN
1.5000 g | Freq: Two times a day (BID) | INTRAVENOUS | Status: AC
  Administered 2020-02-23 – 2020-02-24 (×4): 1.5 g via INTRAVENOUS
  Filled 2020-02-22 (×4): qty 1.5

## 2020-02-22 MED ORDER — VERAPAMIL HCL 2.5 MG/ML IV SOLN
INTRAVENOUS | Status: AC
  Filled 2020-02-22: qty 2

## 2020-02-22 MED ORDER — BISACODYL 10 MG RE SUPP
10.0000 mg | Freq: Every day | RECTAL | Status: DC
  Filled 2020-02-22: qty 1

## 2020-02-22 MED ORDER — HEPARIN (PORCINE) IN NACL 1000-0.9 UT/500ML-% IV SOLN
INTRAVENOUS | Status: DC | PRN
  Administered 2020-02-22 (×2): 500 mL

## 2020-02-22 MED ORDER — NITROGLYCERIN IN D5W 200-5 MCG/ML-% IV SOLN
INTRAVENOUS | Status: AC
  Filled 2020-02-22: qty 250

## 2020-02-22 MED ORDER — SODIUM CHLORIDE 0.9 % IV SOLN: 250.0000 mL | INTRAVENOUS | Status: DC | PRN

## 2020-02-22 MED ORDER — MANNITOL 20 % IV SOLN
Freq: Once | INTRAVENOUS | Status: DC
  Filled 2020-02-22: qty 13

## 2020-02-22 MED ORDER — HEMOSTATIC AGENTS (NO CHARGE) OPTIME
TOPICAL | Status: DC | PRN
  Administered 2020-02-22 (×5): 1 via TOPICAL

## 2020-02-22 MED ORDER — ACETAMINOPHEN 160 MG/5ML PO SOLN: 650.0000 mg | Freq: Once | ORAL | Status: AC

## 2020-02-22 MED ORDER — IOHEXOL 350 MG/ML SOLN
INTRAVENOUS | Status: DC | PRN
  Administered 2020-02-22: 50 mL via INTRA_ARTERIAL

## 2020-02-22 MED ORDER — METOPROLOL TARTRATE 25 MG/10 ML ORAL SUSPENSION: 12.5000 mg | Freq: Two times a day (BID) | ORAL | Status: DC

## 2020-02-22 MED ORDER — SODIUM CHLORIDE 0.9 % IV SOLN: 250.0000 mL | INTRAVENOUS | Status: DC

## 2020-02-22 MED ORDER — ACETAMINOPHEN 650 MG RE SUPP
650.0000 mg | Freq: Once | RECTAL | Status: AC
  Administered 2020-02-23: 650 mg via RECTAL
  Filled 2020-02-22: qty 1

## 2020-02-22 MED ORDER — CALCIUM CHLORIDE 10 % IV SOLN
INTRAVENOUS | Status: DC | PRN
  Administered 2020-02-22: 200 mg via INTRAVENOUS
  Administered 2020-02-22: 800 mg via INTRAVENOUS

## 2020-02-22 MED ORDER — SODIUM CHLORIDE 0.9% FLUSH: 3.0000 mL | Freq: Two times a day (BID) | INTRAVENOUS | Status: DC

## 2020-02-22 MED ORDER — ASPIRIN 81 MG PO CHEW: 324.0000 mg | CHEWABLE_TABLET | Freq: Every day | ORAL | Status: DC

## 2020-02-22 MED ORDER — MORPHINE SULFATE (PF) 2 MG/ML IV SOLN: 1.0000 mg | INTRAVENOUS | Status: DC | PRN

## 2020-02-22 MED ORDER — METOPROLOL TARTRATE 5 MG/5ML IV SOLN
2.5000 mg | INTRAVENOUS | Status: DC | PRN
  Administered 2020-02-24 (×2): 5 mg via INTRAVENOUS
  Filled 2020-02-22 (×3): qty 5

## 2020-02-22 MED ORDER — LACTATED RINGERS IV SOLN: 500.0000 mL | Freq: Once | INTRAVENOUS | Status: DC | PRN

## 2020-02-22 MED ORDER — MIDAZOLAM HCL (PF) 10 MG/2ML IJ SOLN
INTRAMUSCULAR | Status: AC
  Filled 2020-02-22: qty 2

## 2020-02-22 MED ORDER — TRAMADOL HCL 50 MG PO TABS
50.0000 mg | ORAL_TABLET | ORAL | Status: DC | PRN
  Administered 2020-02-23 – 2020-02-24 (×2): 50 mg via ORAL
  Filled 2020-02-22 (×2): qty 1

## 2020-02-22 MED ORDER — HEPARIN SODIUM (PORCINE) 1000 UNIT/ML IJ SOLN
INTRAMUSCULAR | Status: AC
  Filled 2020-02-22: qty 1

## 2020-02-22 MED ORDER — PHENYLEPHRINE 40 MCG/ML (10ML) SYRINGE FOR IV PUSH (FOR BLOOD PRESSURE SUPPORT)
PREFILLED_SYRINGE | INTRAVENOUS | Status: DC | PRN
  Administered 2020-02-22: 120 ug via INTRAVENOUS
  Administered 2020-02-22: 160 ug via INTRAVENOUS
  Administered 2020-02-22: 40 ug via INTRAVENOUS
  Administered 2020-02-22: 80 ug via INTRAVENOUS
  Administered 2020-02-22: 40 ug via INTRAVENOUS
  Administered 2020-02-22: 320 ug via INTRAVENOUS

## 2020-02-22 MED ORDER — SODIUM CHLORIDE 0.9 % WEIGHT BASED INFUSION: 3.0000 mL/kg/h | INTRAVENOUS | Status: DC

## 2020-02-22 MED ORDER — CHLORHEXIDINE GLUCONATE 0.12 % MT SOLN
15.0000 mL | OROMUCOSAL | Status: AC
  Administered 2020-02-23: 15 mL via OROMUCOSAL

## 2020-02-22 MED ORDER — EPINEPHRINE HCL 5 MG/250ML IV SOLN IN NS
0.0000 ug/min | INTRAVENOUS | Status: DC
  Filled 2020-02-22: qty 250

## 2020-02-22 SURGICAL SUPPLY — 135 items
ADAPTER CARDIO PERF ANTE/RETRO (ADAPTER) ×4 IMPLANT
BAG DECANTER FOR FLEXI CONT (MISCELLANEOUS) ×8 IMPLANT
BLADE CLIPPER SURG (BLADE) ×4 IMPLANT
BLADE MINI RND TIP GREEN BEAV (BLADE) ×4 IMPLANT
BLADE STERNUM SYSTEM 6 (BLADE) ×4 IMPLANT
BLADE SURG 11 STRL SS (BLADE) ×8 IMPLANT
BNDG ELASTIC 4X5.8 VLCR STR LF (GAUZE/BANDAGES/DRESSINGS) ×4 IMPLANT
BNDG ELASTIC 6X5.8 VLCR STR LF (GAUZE/BANDAGES/DRESSINGS) ×4 IMPLANT
BNDG GAUZE ELAST 4 BULKY (GAUZE/BANDAGES/DRESSINGS) ×4 IMPLANT
CANISTER SUCT 3000ML PPV (MISCELLANEOUS) ×4 IMPLANT
CANNULA EZ GLIDE AORTIC 21FR (CANNULA) ×4 IMPLANT
CANNULA GUNDRY RCSP 15FR (MISCELLANEOUS) ×4 IMPLANT
CATH CPB KIT OWEN (MISCELLANEOUS) ×4 IMPLANT
CATH HEART VENT LEFT (CATHETERS) ×3 IMPLANT
CATH THORACIC 36FR (CATHETERS) ×4 IMPLANT
CATH THORACIC 36FR RT ANG (CATHETERS) ×4 IMPLANT
CLIP RETRACTION 3.0MM CORONARY (MISCELLANEOUS) ×4 IMPLANT
CLIP VESOCCLUDE MED 24/CT (CLIP) IMPLANT
CLIP VESOCCLUDE SM WIDE 24/CT (CLIP) IMPLANT
CNTNR URN SCR LID CUP LEK RST (MISCELLANEOUS) ×3 IMPLANT
CONN ST 1/4X3/8  BEN (MISCELLANEOUS) ×1
CONN ST 1/4X3/8 BEN (MISCELLANEOUS) ×3 IMPLANT
CONT SPEC 4OZ STRL OR WHT (MISCELLANEOUS) ×1
COVER SURGICAL LIGHT HANDLE (MISCELLANEOUS) IMPLANT
DERMABOND ADVANCED (GAUZE/BANDAGES/DRESSINGS) ×1
DERMABOND ADVANCED .7 DNX12 (GAUZE/BANDAGES/DRESSINGS) ×3 IMPLANT
DEVICE SUT CK QUICK LOAD MINI (Prosthesis & Implant Heart) ×4 IMPLANT
DRAIN CHANNEL 32F RND 10.7 FF (WOUND CARE) ×8 IMPLANT
DRAPE CARDIOVASCULAR INCISE (DRAPES) ×1
DRAPE CV SPLIT W-CLR ANES SCRN (DRAPES) IMPLANT
DRAPE INCISE IOBAN 66X45 STRL (DRAPES) IMPLANT
DRAPE PERI GROIN 82X75IN TIB (DRAPES) IMPLANT
DRAPE SLUSH/WARMER DISC (DRAPES) ×4 IMPLANT
DRAPE SRG 135X102X78XABS (DRAPES) ×3 IMPLANT
DRSG AQUACEL AG ADV 3.5X14 (GAUZE/BANDAGES/DRESSINGS) ×4 IMPLANT
ELECT BLADE 4.0 EZ CLEAN MEGAD (MISCELLANEOUS) ×4
ELECT REM PT RETURN 9FT ADLT (ELECTROSURGICAL) ×8
ELECTRODE BLDE 4.0 EZ CLN MEGD (MISCELLANEOUS) ×3 IMPLANT
ELECTRODE REM PT RTRN 9FT ADLT (ELECTROSURGICAL) ×6 IMPLANT
FELT TEFLON 1X6 (MISCELLANEOUS) ×8 IMPLANT
FIBERTAPE STERNAL CLSR 2 36IN (SUTURE) ×16 IMPLANT
FIBERTAPE STERNAL CLSR 2X36 (SUTURE) ×16 IMPLANT
GAUZE SPONGE 4X4 12PLY STRL (GAUZE/BANDAGES/DRESSINGS) ×8 IMPLANT
GAUZE SPONGE 4X4 12PLY STRL LF (GAUZE/BANDAGES/DRESSINGS) ×8 IMPLANT
GLOVE BIO SURGEON STRL SZ 6 (GLOVE) ×8 IMPLANT
GLOVE BIO SURGEON STRL SZ 6.5 (GLOVE) ×16 IMPLANT
GLOVE BIO SURGEON STRL SZ7 (GLOVE) IMPLANT
GLOVE BIO SURGEON STRL SZ7.5 (GLOVE) IMPLANT
GLOVE BIOGEL PI IND STRL 6.5 (GLOVE) ×3 IMPLANT
GLOVE BIOGEL PI INDICATOR 6.5 (GLOVE) ×1
GLOVE ORTHO TXT STRL SZ7.5 (GLOVE) ×12 IMPLANT
GOWN STRL REUS W/ TWL LRG LVL3 (GOWN DISPOSABLE) ×24 IMPLANT
GOWN STRL REUS W/TWL LRG LVL3 (GOWN DISPOSABLE) ×8
HEMOSTAT POWDER SURGIFOAM 1G (HEMOSTASIS) ×20 IMPLANT
INSERT FOGARTY XLG (MISCELLANEOUS) ×4 IMPLANT
KIT BASIN OR (CUSTOM PROCEDURE TRAY) ×4 IMPLANT
KIT SUCTION CATH 14FR (SUCTIONS) ×12 IMPLANT
KIT SUT CK MINI COMBO 4X17 (Prosthesis & Implant Heart) ×4 IMPLANT
KIT TURNOVER KIT B (KITS) ×4 IMPLANT
KIT VASOVIEW HEMOPRO 2 VH 4000 (KITS) ×4 IMPLANT
LEAD PACING MYOCARDI (MISCELLANEOUS) ×4 IMPLANT
LINE VENT (MISCELLANEOUS) ×4 IMPLANT
MARKER GRAFT CORONARY BYPASS (MISCELLANEOUS) ×12 IMPLANT
NDL SUT PASSING CERCLAGE MED (SUTURE) ×8
NEEDLE SUT PASSING CERCLAG MED (SUTURE) ×6 IMPLANT
NS IRRIG 1000ML POUR BTL (IV SOLUTION) ×20 IMPLANT
PACK E OPEN HEART (SUTURE) ×4 IMPLANT
PACK OPEN HEART (CUSTOM PROCEDURE TRAY) ×4 IMPLANT
PAD ARMBOARD 7.5X6 YLW CONV (MISCELLANEOUS) ×8 IMPLANT
PAD ELECT DEFIB RADIOL ZOLL (MISCELLANEOUS) ×4 IMPLANT
PENCIL BUTTON HOLSTER BLD 10FT (ELECTRODE) ×4 IMPLANT
POSITIONER HEAD DONUT 9IN (MISCELLANEOUS) ×4 IMPLANT
PUNCH AORTIC ROTATE 4.0MM (MISCELLANEOUS) ×4 IMPLANT
PUNCH AORTIC ROTATE 4.5MM 8IN (MISCELLANEOUS) IMPLANT
PUNCH AORTIC ROTATE 5MM 8IN (MISCELLANEOUS) IMPLANT
SENSOR MYOCARDIAL TEMP (MISCELLANEOUS) ×4 IMPLANT
SET CARDIOPLEGIA MPS 5001102 (MISCELLANEOUS) ×4 IMPLANT
SET IRRIG TUBING LAPAROSCOPIC (IRRIGATION / IRRIGATOR) ×4 IMPLANT
SOL ANTI FOG 6CC (MISCELLANEOUS) IMPLANT
SOLUTION ANTI FOG 6CC (MISCELLANEOUS)
SPONGE LAP 18X18 RF (DISPOSABLE) ×4 IMPLANT
SPONGE LAP 4X18 RFD (DISPOSABLE) ×4 IMPLANT
SUPPORT HEART JANKE-BARRON (MISCELLANEOUS) ×4 IMPLANT
SUT BONE WAX W31G (SUTURE) ×4 IMPLANT
SUT ETHIBON 2 0 V 52N 30 (SUTURE) ×8 IMPLANT
SUT ETHIBON EXCEL 2-0 V-5 (SUTURE) IMPLANT
SUT ETHIBOND 2 0 SH (SUTURE)
SUT ETHIBOND 2 0 SH 36X2 (SUTURE) IMPLANT
SUT ETHIBOND 2 0 V4 (SUTURE) IMPLANT
SUT ETHIBOND 2 0V4 GREEN (SUTURE) IMPLANT
SUT ETHIBOND 4 0 RB 1 (SUTURE) IMPLANT
SUT ETHIBOND V-5 VALVE (SUTURE) IMPLANT
SUT ETHIBOND X763 2 0 SH 1 (SUTURE) ×16 IMPLANT
SUT MNCRL AB 3-0 PS2 18 (SUTURE) ×12 IMPLANT
SUT MNCRL AB 4-0 PS2 18 (SUTURE) IMPLANT
SUT PDS AB 1 CTX 36 (SUTURE) ×8 IMPLANT
SUT PROLENE 2 0 SH DA (SUTURE) IMPLANT
SUT PROLENE 3 0 SH DA (SUTURE) ×8 IMPLANT
SUT PROLENE 3 0 SH1 36 (SUTURE) ×20 IMPLANT
SUT PROLENE 4 0 RB 1 (SUTURE) ×4
SUT PROLENE 4 0 SH DA (SUTURE) ×8 IMPLANT
SUT PROLENE 4-0 RB1 .5 CRCL 36 (SUTURE) ×12 IMPLANT
SUT PROLENE 5 0 C 1 36 (SUTURE) IMPLANT
SUT PROLENE 6 0 C 1 30 (SUTURE) IMPLANT
SUT PROLENE 7.0 RB 3 (SUTURE) ×12 IMPLANT
SUT PROLENE 8 0 BV175 6 (SUTURE) IMPLANT
SUT PROLENE BLUE 7 0 (SUTURE) ×4 IMPLANT
SUT PROLENE POLY MONO (SUTURE) IMPLANT
SUT SILK  1 MH (SUTURE) ×2
SUT SILK 1 MH (SUTURE) ×6 IMPLANT
SUT SILK 2 0 SH CR/8 (SUTURE) IMPLANT
SUT SILK 3 0 SH CR/8 (SUTURE) IMPLANT
SUT STEEL 6MS V (SUTURE) IMPLANT
SUT STEEL STERNAL CCS#1 18IN (SUTURE) IMPLANT
SUT STEEL SZ 6 DBL 3X14 BALL (SUTURE) IMPLANT
SUT VIC AB 1 CTX 36 (SUTURE)
SUT VIC AB 1 CTX36XBRD ANBCTR (SUTURE) IMPLANT
SUT VIC AB 2-0 CT1 27 (SUTURE) ×1
SUT VIC AB 2-0 CT1 TAPERPNT 27 (SUTURE) ×3 IMPLANT
SUT VIC AB 2-0 CTX 27 (SUTURE) IMPLANT
SUT VIC AB 3-0 SH 27 (SUTURE)
SUT VIC AB 3-0 SH 27X BRD (SUTURE) IMPLANT
SUT VIC AB 3-0 X1 27 (SUTURE) IMPLANT
SUT VICRYL 4-0 PS2 18IN ABS (SUTURE) IMPLANT
SYSTEM SAHARA CHEST DRAIN ATS (WOUND CARE) ×4 IMPLANT
TAPE CLOTH SURG 4X10 WHT LF (GAUZE/BANDAGES/DRESSINGS) ×4 IMPLANT
TAPE PAPER 2X10 WHT MICROPORE (GAUZE/BANDAGES/DRESSINGS) ×4 IMPLANT
TOWEL GREEN STERILE (TOWEL DISPOSABLE) ×4 IMPLANT
TOWEL GREEN STERILE FF (TOWEL DISPOSABLE) ×4 IMPLANT
TRAY FOLEY SLVR 16FR TEMP STAT (SET/KITS/TRAYS/PACK) ×4 IMPLANT
TUBING LAP HI FLOW INSUFFLATIO (TUBING) ×8 IMPLANT
UNDERPAD 30X36 HEAVY ABSORB (UNDERPADS AND DIAPERS) ×4 IMPLANT
VALVE AORTIC SZ25 INSP/RESIL (Prosthesis & Implant Heart) ×4 IMPLANT
VENT LEFT HEART 12002 (CATHETERS) ×4
WATER STERILE IRR 1000ML POUR (IV SOLUTION) ×8 IMPLANT

## 2020-02-22 SURGICAL SUPPLY — 13 items
CATH INFINITI 5 FR JL3.5 (CATHETERS) ×1 IMPLANT
CATH INFINITI JR4 5F (CATHETERS) ×1 IMPLANT
DEVICE RAD COMP TR BAND LRG (VASCULAR PRODUCTS) ×1 IMPLANT
GLIDESHEATH SLEND A-KIT 6F 22G (SHEATH) ×2 IMPLANT
GUIDEWIRE INQWIRE 1.5J.035X260 (WIRE) IMPLANT
INQWIRE 1.5J .035X260CM (WIRE) ×2
KIT HEART LEFT (KITS) ×2 IMPLANT
PACK CARDIAC CATHETERIZATION (CUSTOM PROCEDURE TRAY) ×3 IMPLANT
SHEATH PINNACLE 6F 10CM (SHEATH) ×1 IMPLANT
SHEATH PROBE COVER 6X72 (BAG) ×2 IMPLANT
TRANSDUCER W/STOPCOCK (MISCELLANEOUS) ×2 IMPLANT
TUBING CIL FLEX 10 FLL-RA (TUBING) ×2 IMPLANT
WIRE EMERALD 3MM-J .035X150CM (WIRE) ×1 IMPLANT

## 2020-02-22 NOTE — Anesthesia Procedure Notes (Addendum)
Procedure Name: Intubation Date/Time: 02/22/2020 4:17 PM Performed by: Barrington Ellison, CRNA Pre-anesthesia Checklist: Patient identified, Emergency Drugs available, Suction available and Patient being monitored Patient Re-evaluated:Patient Re-evaluated prior to induction Oxygen Delivery Method: Circle System Utilized Preoxygenation: Pre-oxygenation with 100% oxygen Induction Type: IV induction Ventilation: Mask ventilation without difficulty and Oral airway inserted - appropriate to patient size Laryngoscope Size: Mac and 3 Grade View: Grade IV Tube type: Oral Tube size: 8.0 mm Number of attempts: 1 Airway Equipment and Method: Stylet and Oral airway Placement Confirmation: ETT inserted through vocal cords under direct vision,  positive ETCO2 and breath sounds checked- equal and bilateral Secured at: 22 cm Tube secured with: Tape Dental Injury: Teeth and Oropharynx as per pre-operative assessment

## 2020-02-22 NOTE — Progress Notes (Signed)
ANTICOAGULATION CONSULT NOTE  Pharmacy Consult for Heparin Indication: chest pain/ACS  No Known Allergies  Patient Measurements: Height: 5\' 3"  (160 cm) Weight: 70 kg (154 lb 6.4 oz) IBW/kg (Calculated) : 56.9 Heparin Dosing Weight: 71.6kg   Vital Signs: Temp: 99 F (37.2 C) (09/12 2322) Temp Source: Oral (09/12 2322) BP: 133/77 (09/12 2322) Pulse Rate: 57 (09/12 2322)  Labs: Recent Labs    02/21/20 1555 02/21/20 2140 02/22/20 0208  HGB 14.3  --  13.2  HCT 41.1  --  38.6*  PLT 142*  --  136*  LABPROT  --   --  14.6  INR  --   --  1.2  HEPARINUNFRC  --   --  0.40  CREATININE 0.88  --   --   TROPONINIHS 293* 418*  --     Estimated Creatinine Clearance: 68.6 mL/min (by C-G formula based on SCr of 0.88 mg/dL).   Medical History: Past Medical History:  Diagnosis Date  . High cholesterol   . Hypertension     Medications:  Scheduled:  . aspirin EC  81 mg Oral Daily  . atorvastatin  80 mg Oral Daily  . carvedilol  6.25 mg Oral BID WC  . losartan  100 mg Oral Daily  . nitroGLYCERIN  0.5 inch Topical Q6H    Assessment: Patient is a 69 tom that presented to the ED with c/o chest pain and SOB. The patient was found to have an elevated trop level. Pharmacy has been asked to dose heparin at this time for ACS. No blood thinners noted however plt count was low on presenting to the ED  (142). Plans noted for cath today -heparin level at goal  Goal of Therapy:  Heparin level 0.3-0.7 units/ml Monitor platelets by anticoagulation protocol: Yes   Plan:  - Continue heparin @ 850 units/hr - Will follow plans post cath  Hildred Laser, PharmD Clinical Pharmacist **Pharmacist phone directory can now be found on [email or web address] ([email or web address]).  Listed under [place].

## 2020-02-22 NOTE — H&P (View-Only) (Signed)
Progress Note  Patient Name: Alan Mcdonald Date of Encounter: 02/22/2020  [facility] Cardiologist: No primary care provider on file.  Sees a cardiologist at [facility] had another episode of chest discomfort yesterday.  Ate only a very small portion of his breakfast this morning.  Currently getting echocardiogram.  Chest pain-free.  Spoke with his wife as well up in room.  Denies any further sequelae of previous amaurosis fugax.  Denies any further issues with follicular lymphoma treated in 2016.  Inpatient Medications    Scheduled Meds: . aspirin EC  81 mg Oral Daily  . atorvastatin  80 mg Oral Daily  . carvedilol  6.25 mg Oral BID WC  . losartan  100 mg Oral Daily  . sodium chloride flush  3 mL Intravenous Q12H   Continuous Infusions: . heparin 850 Units/hr (02/22/20 0600)   PRN Meds: acetaminophen, nitroGLYCERIN, ondansetron (ZOFRAN) IV   Vital Signs    Vitals:   02/21/20 2322 02/22/20 0423 02/22/20 0753 02/22/20 0838  BP: 133/77 130/79 (!) 158/85 129/69  Pulse: (!) 57 (!) 57 60 (!) 57  Resp: 18 16  17   Temp: 99 F (37.2 C) 98.9 F (37.2 C)  98.4 F (36.9 C)  TempSrc: Oral Oral  Oral  SpO2: 98% 95%  97%  Weight: 70 kg 70.2 kg    Height: 5\' 3"  (1.6 m)       Intake/Output Summary (Last 24 hours) at 02/22/2020 0942 Last data filed at 02/22/2020 0841 Gross per 24 hour  Intake 780.89 ml  Output 150 ml  Net 630.89 ml   Last 3 Weights 02/22/2020 02/21/2020 02/21/2020  Weight (lbs) 154 lb 11.2 oz 154 lb 6.4 oz 160 lb  Weight (kg) 70.171 kg 70.035 kg 72.576 kg      Telemetry    No adverse arrhythmias- Personally Reviewed  ECG    Sinus rhythm, right bundle branch block with downsloping ST segment depression, T wave inversion in the anterior leads concerning for ischemia.- Personally Reviewed  Physical Exam   GEN: No acute distress.   Neck: No JVD Cardiac: RRR, 2/6 systolic murmur, no rubs, or gallops.  Respiratory:  Clear to auscultation bilaterally. GI: Soft, nontender, non-distended  MS: No edema; No deformity.  Tattoos noted Neuro:  Nonfocal  Psych: Normal affect   Labs    High Sensitivity Troponin:   Recent Labs  Lab 02/21/20 1555 02/21/20 2140  TROPONINIHS 293* 418*      Chemistry Recent Labs  Lab 02/21/20 1555 02/22/20 0208  NA 139 142  K 3.1* 3.8  CL 103 103  CO2 23 30  GLUCOSE 187* 137*  BUN 11 12  CREATININE 0.88 0.92  CALCIUM 9.4 9.2  GFRNONAA >60 >60  GFRAA >60 >60  ANIONGAP 13 9     Hematology Recent Labs  Lab 02/21/20 1555 02/22/20 0208  WBC 7.8 7.4  RBC 4.47 4.22  HGB 14.3 13.2  HCT 41.1 38.6*  MCV 91.9 91.5  MCH 32.0 31.3  MCHC 34.8 34.2  RDW 12.9 13.0  PLT 142* 136*    BNP Recent Labs  Lab 02/21/20 1555  BNP 147.0*     DDimer No results for input(s): DDIMER in the last 168 hours.   Radiology    DG Chest 2 View  Result Date: 02/21/2020 CLINICAL DATA:  Chest pain, shortness of breath and nausea for 2 months, 2 episodes today, tightness LEFT breast, hypertension, former smoker EXAM: CHEST - 2 VIEW  COMPARISON:  None FINDINGS: Normal heart size, mediastinal contours, and pulmonary vascularity. Mild peribronchial thickening and accentuation of perihilar interstitial markings. No pulmonary infiltrate, pleural effusion, or pneumothorax. Endplate spur formation thoracic spine. IMPRESSION: Bronchitic changes without infiltrate. Electronically Signed   By: Lavonia Dana M.D.   On: 02/21/2020 16:34    Cardiac Studies   Echocardiogram-currently getting one performed.  Aortic stenosis continues to appear mild.  Normal EF.  Patient Profile     70 y.o. male with known coronary artery disease, status post two RCA stents in 2003 here with non-ST elevation myocardial infarction  Assessment & Plan    Non-ST elevation myocardial infarction -Cardiac catheterization.  Risks and benefits of been explained including stroke heart attack death renal impairment  bleeding.  He is willing to proceed.  Spoke to wife as well. -Continue with atorvastatin 80 mg, heparin IV, carvedilol 6.25 twice daily.  Coronary artery disease -PCI 2003 to RCA.  Mild aortic stenosis -Appears to be mild on preliminary review of echocardiogram.  Peak velocity approximately 2.5 m/s.  EF appears normal.  Follicular lymphoma -In remission since 2016.  He is not on any chemotherapeutic agents.  TIA -On dual antiplatelet therapy, has been taking Plavix.  He had an episode of amaurosis fugax. MRI brain showed remote CVA.      For questions or updates, please contact [place] Please consult [email or web address] for contact info under        Signed, Candee Furbish, MD  02/22/2020, 9:42 AM

## 2020-02-22 NOTE — Interval H&P Note (Signed)
Cath Lab Visit (complete for each Cath Lab visit)  Clinical Evaluation Leading to the Procedure:   ACS: Yes.    Non-ACS:    Anginal Classification: CCS III  Anti-ischemic medical therapy: Minimal Therapy (1 class of medications)  Non-Invasive Test Results: Intermediate-risk stress test findings: cardiac mortality 1-3%/year  Prior CABG: No previous CABG      History and Physical Interval Note:  02/22/2020 2:31 PM  Alan Mcdonald  has presented today for surgery, with the diagnosis of nonstemi.  The various methods of treatment have been discussed with the patient and family. After consideration of risks, benefits and other options for treatment, the patient has consented to  Procedure(s): LEFT HEART CATH AND CORONARY ANGIOGRAPHY (N/A) as a surgical intervention.  The patient's history has been reviewed, patient examined, no change in status, stable for surgery.  I have reviewed the patient's chart and labs.  Questions were answered to the patient's satisfaction.     [name]

## 2020-02-22 NOTE — Brief Op Note (Addendum)
02/21/2020 - 02/22/2020  7:47 PM  PATIENT:  Alan Mcdonald  69 y.o. male  PRE-OPERATIVE DIAGNOSIS:  1. S/p NSTEMI 2.SEVERE CORONARY ARTERY DISEASE (LEFT MAIN DISEASE) 3. MODERATE AORTIC STENOSIS  POST-OPERATIVE DIAGNOSIS: 1. S/p NSTEMI 2.SEVERE CORONARY ARTERY DISEASE (LEFT MAIN DISEASE) 3. MODERATE AORTIC STENOSIS  PROCEDURE: TRANSESOPHAGEAL ECHOCARDIOGRAM (TEE), EMERGENT CORONARY ARTERY BYPASS GRAFTING (CABG) x 2 (LIMA to LAD, SVG to OM) using LEFT INTERNAL MAMMARY ARTERY AND RIGHT THIGH GREATER SAPHENOUS VEIN and AORTIC VALVE REPLACEMENT (AVR) (USING EDWARDS [employer] MODEL# [id number], Size 25 MM, SERIAL # [id number])  SVG HARVEST TIME: 19 minutes SVG PREP TIME: 12 minute  SURGEON:  Surgeon(s) and Role:    Rexene Alberts, MD - Primary  PHYSICIAN ASSISTANT: Lars Pinks PA-C  ASSISTANTS: Alcide Evener RNFA  ANESTHESIA:   general  EBL:  Per anesthesia and perfusion record  DRAINS: Chest tubes placed in the mediastinal and pleural spaces   SPECIMEN:  Source of Specimen:  Native AV leaflets  DISPOSITION OF SPECIMEN:  PATHOLOGY  COUNTS CORRECT:  YES  DICTATION: .Dragon Dictation  PLAN OF CARE: Admit to inpatient   PATIENT DISPOSITION:  ICU - intubated and hemodynamically stable.   Delay start of Pharmacological VTE agent (>24hrs) due to surgical blood loss or risk of bleeding: yes  BASELINE WEIGHT: 70 kg

## 2020-02-22 NOTE — Op Note (Signed)
CARDIOTHORACIC SURGERY OPERATIVE NOTE  Date of Procedure:  02/22/2020  Preoperative Diagnosis:   Critical Left Main Coronary Artery Disease  S/P Acute Non-ST Segment Elevation Myocardial Infarction  Moderate Aortic Stenosis  Postoperative Diagnosis: Same  Procedure:    Emergency Coronary Artery Bypass Grafting x 2  Left Internal Mammary Artery to Distal Left Anterior Descending Coronary Artery  Saphenous Vein Graft to Obtuse Marginal Branch of Left Circumflex Coronary Artery  Endoscopic Vein Harvest from Right Thigh   Aortic Valve Replacement  Edwards Inspiris Resilia Stented Bovine Pericardial Tissue Valve (size 64mm, ref # [id number], serial # [id number])  Surgeon: Valentina Gu. Roxy Manns, MD  Assistant: Nani Skillern, PA-C  Anesthesia: Laurie Panda, MD and Roberts Gaudy, MD  Operative Findings:  Moderate aortic stenosis  Normal left ventricular systolic function  Good quality left internal mammary artery conduit  Small caliber but otherwise good quality saphenous vein conduit  Diffusely calcified coronary arteries with fair quality target vessels for grafting            BRIEF CLINICAL NOTE AND INDICATIONS FOR SURGERY  Patient is 69 year old male with known history of aortic stenosis and coronary artery disease, amaurosis fugax on dual antiplatelet therapy, hypertension, hyperlipidemia, and follicular lymphoma in remission who was admitted to the hospital with acute coronary syndrome, has ruled in for acute non-ST segment elevation myocardial infarction, and been referred for emergent surgical consultation following diagnostic cardiac catheterization which revealed critical left main coronary artery disease.  Patient's cardiac history dates back approximately 20 years ago when he presented with symptoms of angina and was found to have single-vessel coronary artery disease.  He was treated with PCI and stenting of the right coronary artery.  He has done well from a  cardiac standpoint until recently.  He developed follicular lymphoma and at that time was noted to have a heart murmur on exam.  Echocardiograms have reportedly revealed normal left ventricular function with mild to moderate aortic stenosis.  The patient states that approximately 1 year ago he had an episode of amaurosis fugax involving the left eye.  He was started on dual antiplatelet therapy using aspirin and Plavix at that time.  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  Approximately 2 to 3 months ago the patient began to experience symptoms of exertional chest pain with shortness of breath.  Symptoms have accelerated and become associated with less physical activity.  He reportedly had a recent stress test that was low risk.  Yesterday morning he developed prolonged episode of chest pain at rest which took more than 30 minutes to resolve.  He presented to the emergency department where EKG revealed anterolateral ST depression.  Cardiac enzymes were weakly positive.  Transthoracic echocardiogram revealed normal left ventricular function with moderate aortic stenosis.  The patient was brought to the Cath Lab this afternoon for elective diagnostic catheterization by Dr. Tamala Julian.  Catheterization revealed critical left main coronary artery stenosis.  The stents in the right coronary artery remain widely patent.  Right heart catheterization was not performed.  During catheterization the patient developed substernal chest pain at rest which resolved with intravenous nitroglycerin.  Emergent surgical consultation was requested.  The patient has been seen in consultation in the cath labe and counseled at length regarding the indications, risks and potential benefits of surgery.  All questions have been answered, and the patient provides full informed consent for the operation as  described.    DETAILS OF THE OPERATIVE  PROCEDURE  Preparation:  The patient is brought to the operating room on the above mentioned date and central monitoring was established by the anesthesia team including placement of Swan-Ganz catheter.  The patient's radial artery sheath placed in the Cath Lab was utilized for blood pressure monitoring.  The patient is placed in the supine position on the operating table.  Intravenous antibiotics are administered. General endotracheal anesthesia is induced uneventfully. A Foley catheter is placed.  Baseline transesophageal echocardiogram was performed.  Findings were notable for normal left ventricular systolic function with mild left ventricular hypertrophy.  The aortic valve was trileaflet.  There was moderate aortic stenosis.  There was severe calcification and thickening involving the left coronary leaflet which was essentially immobile.  There was moderate fibrosis and calcification involving the noncoronary leaflet which had reduced leaflet mobility.  The right coronary leaflet had mild disease and was normally mobile.  Mean transvalvular gradient was estimated between 13 and 18 mmHg.  There was trivial aortic insufficiency.  No other abnormalities were noted.  The patient's chest, abdomen, both groins, and both lower extremities are prepared and draped in a sterile manner. A time out procedure is performed.   Surgical Approach and Conduit Harvest:  A median sternotomy incision was performed and the left internal mammary artery is dissected from the chest wall and prepared for bypass grafting. The left internal mammary artery is notably good quality conduit. Simultaneously, saphenous vein is obtained from the patient's right thigh using endoscopic vein harvest technique. The saphenous vein is notably small in caliber but otherwise quality conduit. After removal of the saphenous vein, the small surgical incisions in the lower extremity are closed with absorbable suture. Following systemic  heparinization, the left internal mammary artery was transected distally noted to have excellent flow.   Extracorporeal Cardiopulmonary Bypass and Myocardial Protection:  The pericardium is opened. The ascending aorta is mildly dilated in appearance. The ascending aorta and the right atrium are cannulated for cardiopulmonary bypass.  Adequate heparinization is verified.    A retrograde cardioplegia cannula is placed through the right atrium into the coronary sinus.  The operative field was continuously flooded with carbon dioxide gas.  The entire pre-bypass portion of the operation was notable for stable hemodynamics.  Cardiopulmonary bypass was begun and a left ventricular vent placed through the right superior pulmonary vein.  The surface of the heart inspected. Distal target vessels are selected for coronary artery bypass grafting. A cardioplegia cannula is placed in the ascending aorta.  A temperature probe was placed in the interventricular septum.  The patient is cooled passively to 32C systemic temperature.  The aortic cross clamp is applied and cardioplegia is delivered initially in an antegrade fashion through the aortic root using modified [place] cold blood cardioplegia (Kennestone blood cardioplegia protocol).   The initial cardioplegic arrest is rapid with early diastolic arrest.  Repeat doses of cardioplegia are administered at 90 minutes and every 30 minutes thereafter through the aortic root, the coronary sinus catheter, and through subsequently placed vein graft in order to maintain completely flat electrocardiogram and septal myocardial temperature below 15C.  Myocardial protection was felt to be excellent.   Coronary Artery Bypass Grafting:   The obtuse marginal branch of the left circumflex coronary artery was grafted using a reversed saphenous vein graft in an end-to-side fashion.  At the site of distal anastomosis the target vessel was fair to good quality and measured  approximately 1.8  mm in diameter.  The distal left anterior coronary artery was grafted with the left internal mammary artery in an end-to-side fashion.  At the site of distal anastomosis the target vessel was diffusely diseased, fair quality and measured approximately 1.8 mm in diameter.   Aortic Valve Replacement:  An oblique transverse aortotomy incision was performed.  The aortic valve was inspected and notable for severely calcified leaflets with moderate aortic stenosis.  The aortic valve leaflets were excised sharply and the aortic annulus decalcified.  Decalcification was notably straightforward.  The aortic annulus was sized to accept a 25 mm prosthesis.  The aortic root and left ventricle were irrigated with copious cold saline solution.  Aortic valve replacement was performed using interrupted horizontal mattress 2-0 Ethibond pledgeted sutures with pledgets in the subannular position.  An Edwards Inspiris Resilia stented bovine pericardial tissue valve (size 25 mm, ref # [id number], serial # [id number]) was implanted uneventfully. The valve seated appropriately with adequate space beneath the left main and right coronary artery.  All sutures were secured using a Cor-knot device.  The aortotomy was closed using a 2-layer closure of running 4-0 Prolene suture.   Procedure Completion:  The single proximal vein graft anastomosis was placed directly to the ascending aorta prior to removal of the aortic cross clamp.  The septal myocardial temperature rose rapidly after reperfusion of the left internal mammary artery graft.  One final dose of warm retrograde "reanimation dose" cardioplegia was administered through the coronary sinus catheter while all air was evacuated through the aortic root.  The aortic cross clamp was removed after a total cross clamp time of 101 minutes.  All proximal and distal coronary anastomoses were inspected for hemostasis and appropriate graft orientation. Epicardial pacing  wires are fixed to the inferior right ventricular freewall and to the right atrial appendage. The patient is rewarmed to 37C temperature. The aortic and left ventricular vents were removed.  The patient is weaned and disconnected from cardiopulmonary bypass.  The patient's rhythm at separation from bypass was AV paced.  The patient was weaned from cardiopulmonary bypass without any inotropic support.  Shortly after separation from bypass the patient developed gradually progressive hypotension that appeared to be related to vasodilatation.  This persisted and prompted return to cardiopulmonary bypass during which time intravenous Levophed infusion was initiated.  Of note, hematocrit at the time of separation from bypass was 18%.  After resting on cardiopulmonary bypass for an additional 10 minutes the patient was successfully weaned and separated from bypass without difficulty.  Total cardiopulmonary bypass time for the operation was135 minutes.  Followup transesophageal echocardiogram performed after separation from bypass revealed a well-seated bioprosthetic tissue valve in the aortic position that was functioning normally.  There was no paravalvular leak.  There were otherwise no changes from the preoperative exam.  The patient received 3 units packed red blood cells during the procedure due to anemia which was present preoperatively and exacerbated by acute blood loss and hemodilution during cardiopulmonary bypass.  The aortic and venous cannula were removed uneventfully. Protamine was administered to reverse the anticoagulation. The mediastinum and pleural space were inspected for hemostasis and irrigated with saline solution.  The patient received a total of 2 packs adult platelets due to coagulopathy and thrombocytopenia and platelet dysfunction verified using thromboelastography after separation from cardiopulmonary bypass and reversal of heparin with protamine.  The mediastinum and the left pleural  space were drained using 3 chest tubes placed through separate stab incisions inferiorly.  The soft  tissues anterior to the aorta were reapproximated loosely. The sternum is closed with double strength sternal wire. The soft tissues anterior to the sternum were closed in multiple layers and the skin is closed with a running subcuticular skin closure.  The post-bypass portion of the operation was notable for stable rhythm and somewhat labile blood pressure that stabilized after transfusion PRBCs.    Disposition:  The patient tolerated the procedure well and is transported to the surgical intensive care in stable condition. There are no intraoperative complications. All sponge instrument and needle counts are verified correct at completion of the operation.    Valentina Gu. Roxy Manns MD 02/22/2020 9:42 PM

## 2020-02-22 NOTE — Plan of Care (Signed)
  Problem: Pain Managment: Goal: General experience of comfort will improve Outcome: Progressing   Problem: Safety: Goal: Ability to remain free from injury will improve Outcome: Progressing   

## 2020-02-22 NOTE — Anesthesia Procedure Notes (Signed)
Central Venous Catheter Insertion Performed by: Oleta Mouse, MD, anesthesiologist Start/End9/13/2021 4:15 PM, 02/22/2020 4:25 PM Patient location: OR. Preanesthetic checklist: patient identified, IV checked, site marked, risks and benefits discussed, surgical consent, monitors and equipment checked, pre-op evaluation, timeout performed and anesthesia consent Lidocaine 1% used for infiltration and patient sedated Hand hygiene performed  and maximum sterile barriers used  Catheter size: 9 Fr Total catheter length 10. MAC introducer Procedure performed using ultrasound guided technique. Ultrasound Notes:anatomy identified, needle tip was noted to be adjacent to the nerve/plexus identified, no ultrasound evidence of intravascular and/or intraneural injection and image(s) printed for medical record Attempts: 1 Following insertion, line sutured. Post procedure assessment: blood return through all ports, free fluid flow and no air  Patient tolerated the procedure well with no immediate complications.

## 2020-02-22 NOTE — Anesthesia Procedure Notes (Signed)
Central Venous Catheter Insertion Performed by: Oleta Mouse, MD, anesthesiologist Start/End9/13/2021 4:15 PM, 02/22/2020 4:25 PM Patient location: Pre-op. Preanesthetic checklist: patient identified, IV checked, site marked, risks and benefits discussed, surgical consent, monitors and equipment checked, pre-op evaluation, timeout performed and anesthesia consent Hand hygiene performed  and maximum sterile barriers used  PA cath was placed.Swan type:thermodilution PA Cath depth:15 Procedure performed without using ultrasound guided technique. Attempts: 1 Patient tolerated the procedure well with no immediate complications.

## 2020-02-22 NOTE — Progress Notes (Signed)
Progress Note  Patient Name: Alan Mcdonald Date of Encounter: 02/22/2020  [facility] Cardiologist: No primary care provider on file.  Sees a cardiologist at [place] had another episode of chest discomfort yesterday.  Ate only a very small portion of his breakfast this morning.  Currently getting echocardiogram.  Chest pain-free.  Spoke with his wife as well up in room.  Denies any further sequelae of previous amaurosis fugax.  Denies any further issues with follicular lymphoma treated in 2016.  Inpatient Medications    Scheduled Meds: . aspirin EC  81 mg Oral Daily  . atorvastatin  80 mg Oral Daily  . carvedilol  6.25 mg Oral BID WC  . losartan  100 mg Oral Daily  . sodium chloride flush  3 mL Intravenous Q12H   Continuous Infusions: . heparin 850 Units/hr (02/22/20 0600)   PRN Meds: acetaminophen, nitroGLYCERIN, ondansetron (ZOFRAN) IV   Vital Signs    Vitals:   02/21/20 2322 02/22/20 0423 02/22/20 0753 02/22/20 0838  BP: 133/77 130/79 (!) 158/85 129/69  Pulse: (!) 57 (!) 57 60 (!) 57  Resp: 18 16  17   Temp: 99 F (37.2 C) 98.9 F (37.2 C)  98.4 F (36.9 C)  TempSrc: Oral Oral  Oral  SpO2: 98% 95%  97%  Weight: 70 kg 70.2 kg    Height: 5\' 3"  (1.6 m)       Intake/Output Summary (Last 24 hours) at 02/22/2020 0942 Last data filed at 02/22/2020 0841 Gross per 24 hour  Intake 780.89 ml  Output 150 ml  Net 630.89 ml   Last 3 Weights 02/22/2020 02/21/2020 02/21/2020  Weight (lbs) 154 lb 11.2 oz 154 lb 6.4 oz 160 lb  Weight (kg) 70.171 kg 70.035 kg 72.576 kg      Telemetry    No adverse arrhythmias- Personally Reviewed  ECG    Sinus rhythm, right bundle branch block with downsloping ST segment depression, T wave inversion in the anterior leads concerning for ischemia.- Personally Reviewed  Physical Exam   GEN: No acute distress.   Neck: No JVD Cardiac: RRR, 2/6 systolic murmur, no rubs, or gallops.  Respiratory:  Clear to auscultation bilaterally. GI: Soft, nontender, non-distended  MS: No edema; No deformity.  Tattoos noted Neuro:  Nonfocal  Psych: Normal affect   Labs    High Sensitivity Troponin:   Recent Labs  Lab 02/21/20 1555 02/21/20 2140  TROPONINIHS 293* 418*      Chemistry Recent Labs  Lab 02/21/20 1555 02/22/20 0208  NA 139 142  K 3.1* 3.8  CL 103 103  CO2 23 30  GLUCOSE 187* 137*  BUN 11 12  CREATININE 0.88 0.92  CALCIUM 9.4 9.2  GFRNONAA >60 >60  GFRAA >60 >60  ANIONGAP 13 9     Hematology Recent Labs  Lab 02/21/20 1555 02/22/20 0208  WBC 7.8 7.4  RBC 4.47 4.22  HGB 14.3 13.2  HCT 41.1 38.6*  MCV 91.9 91.5  MCH 32.0 31.3  MCHC 34.8 34.2  RDW 12.9 13.0  PLT 142* 136*    BNP Recent Labs  Lab 02/21/20 1555  BNP 147.0*     DDimer No results for input(s): DDIMER in the last 168 hours.   Radiology    DG Chest 2 View  Result Date: 02/21/2020 CLINICAL DATA:  Chest pain, shortness of breath and nausea for 2 months, 2 episodes today, tightness LEFT breast, hypertension, former smoker EXAM: CHEST - 2 VIEW  COMPARISON:  None FINDINGS: Normal heart size, mediastinal contours, and pulmonary vascularity. Mild peribronchial thickening and accentuation of perihilar interstitial markings. No pulmonary infiltrate, pleural effusion, or pneumothorax. Endplate spur formation thoracic spine. IMPRESSION: Bronchitic changes without infiltrate. Electronically Signed   By: Lavonia Dana M.D.   On: 02/21/2020 16:34    Cardiac Studies   Echocardiogram-currently getting one performed.  Aortic stenosis continues to appear mild.  Normal EF.  Patient Profile     69 y.o. male with known coronary artery disease, status post two RCA stents in 2003 here with non-ST elevation myocardial infarction  Assessment & Plan    Non-ST elevation myocardial infarction -Cardiac catheterization.  Risks and benefits of been explained including stroke heart attack death renal impairment  bleeding.  He is willing to proceed.  Spoke to wife as well. -Continue with atorvastatin 80 mg, heparin IV, carvedilol 6.25 twice daily.  Coronary artery disease -PCI 2003 to RCA.  Mild aortic stenosis -Appears to be mild on preliminary review of echocardiogram.  Peak velocity approximately 2.5 m/s.  EF appears normal.  Follicular lymphoma -In remission since 2016.  He is not on any chemotherapeutic agents.  TIA -On dual antiplatelet therapy, has been taking Plavix.  He had an episode of amaurosis fugax. MRI brain showed remote CVA.      For questions or updates, please contact [facility] Please consult [email or web address] for contact info under        Signed, Candee Furbish, MD  02/22/2020, 9:42 AM

## 2020-02-22 NOTE — Anesthesia Preprocedure Evaluation (Addendum)
Anesthesia Evaluation  Patient identified by MRN, date of birth, ID band Patient awake    Reviewed: Allergy & Precautions, NPO status , Unable to perform ROS - Chart review onlyPreop documentation limited or incomplete due to emergent nature of procedure.  History of Anesthesia Complications Negative for: history of anesthetic complications  Airway Mallampati: III  TM Distance: >3 FB Neck ROM: Full    Dental  (+) Dental Advisory Given, Teeth Intact   Pulmonary neg recent URI, former smoker,  Covid-19 Nucleic Acid Test Results Lab Results      Component                Value               Date                      SARSCOV2NAA              NEGATIVE            02/21/2020              breath sounds clear to auscultation       Cardiovascular hypertension, Pt. on medications + angina + CAD, + Past MI and + Cardiac Stents  (-) CABG (-) dysrhythmias  Rhythm:Regular + Systolic murmurs    Neuro/Psych negative neurological ROS  negative psych ROS   GI/Hepatic negative GI ROS, Neg liver ROS,   Endo/Other  negative endocrine ROS  Renal/GU negative Renal ROS     Musculoskeletal negative musculoskeletal ROS (+)   Abdominal   Peds  Hematology  (+) Blood dyscrasia, anemia , Lab Results      Component                Value               Date                      WBC                      7.4                 02/22/2020                HGB                      11.6 (L)            02/22/2020                HCT                      34.0 (L)            02/22/2020                MCV                      91.5                02/22/2020                PLT                      136 (L)             02/22/2020  Anesthesia Other Findings   Reproductive/Obstetrics                            Anesthesia Physical Anesthesia Plan  ASA: IV  Anesthesia Plan: General   Post-op Pain Management:    Induction:  Intravenous  PONV Risk Score and Plan: 2 and Treatment may vary due to age or medical condition  Airway Management Planned: Oral ETT  Additional Equipment: Arterial line, CVP, PA Cath, TEE and Ultrasound Guidance Line Placement  Intra-op Plan:   Post-operative Plan: Post-operative intubation/ventilation  Informed Consent: I have reviewed the patients History and Physical, chart, labs and discussed the procedure including the risks, benefits and alternatives for the proposed anesthesia with the patient or authorized representative who has indicated his/her understanding and acceptance.     Dental advisory given  Plan Discussed with: CRNA and Surgeon  Anesthesia Plan Comments:         Anesthesia Quick Evaluation

## 2020-02-22 NOTE — Consult Note (Signed)
[address]       [place] [address]             [phone]          CARDIOTHORACIC SURGERY CONSULTATION REPORT  PCP is [facility] Referring Provider is Sinclair Grooms, MD Primary Cardiologist is No primary care provider on file.  Reason for consultation:  Critical left main coronary artery disease  HPI:  Patient is 69 year old male with known history of aortic stenosis and coronary artery disease, amaurosis fugax on dual antiplatelet therapy, hypertension, hyperlipidemia, and follicular lymphoma in remission who was admitted to the hospital with acute coronary syndrome, has ruled in for acute non-ST segment elevation myocardial infarction, and been referred for emergent surgical consultation following diagnostic cardiac catheterization which revealed critical left main coronary artery disease.  Patient's cardiac history dates back approximately 20 years ago when he presented with symptoms of angina and was found to have single-vessel coronary artery disease.  He was treated with PCI and stenting of the right coronary artery.  He has done well from a cardiac standpoint until recently.  He developed follicular lymphoma and at that time was noted to have a heart murmur on exam.  Echocardiograms have reportedly revealed normal left ventricular function with mild to moderate aortic stenosis.  The patient states that approximately 1 year ago he had an episode of amaurosis fugax involving the left eye.  He was started on dual antiplatelet therapy using aspirin and Plavix at that time.  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  Approximately 2 to 3 months ago the patient began to experience symptoms of exertional chest pain with shortness of breath.  Symptoms have accelerated and become associated with less physical activity.  He reportedly had a recent stress test  that was low risk.  Yesterday morning he developed prolonged episode of chest pain at rest which took more than 30 minutes to resolve.  He presented to the emergency department where EKG revealed anterolateral ST depression.  Cardiac enzymes were weakly positive.  Transthoracic echocardiogram revealed normal left ventricular function with moderate aortic stenosis.  The patient was brought to the Cath Lab this afternoon for elective diagnostic catheterization by Dr. Tamala Julian.  Catheterization revealed critical left main coronary artery stenosis.  The stents in the right coronary artery remain widely patent.  Right heart catheterization was not performed.  During catheterization the patient developed substernal chest pain at rest which resolved with intravenous nitroglycerin.  Emergent surgical consultation was requested.  Patient is married and lives locally in [place], [place] with his wife.  He states that up until recently he has remained reasonably active physically for his age and without significant physical limitations.  At present he denies ongoing chest pain or shortness of breath.  He has not had any recent treatment visual disturbances.  He denies resting shortness of breath, PND, or orthopnea, or lower extremity edema.  He has had accelerating symptoms of exertional chest pain for at least 2 months.  He is fully vaccinated against the COVID-19 vaccine.  Past Medical History:  Diagnosis Date   Aortic stenosis 02/22/2020   Coronary artery disease    High cholesterol    History of lymphoma    Hypertension     Past Surgical History:  Procedure Laterality Date   CERVICAL SPINE SURGERY     CHOLECYSTECTOMY  ELBOW SURGERY     KIDNEY STONE SURGERY      History reviewed. No pertinent family history.  Social History   Socioeconomic History   Marital status: Married    Spouse name: Not on file   Number of children: Not on file   Years of education: Not on file   Highest  education level: Not on file  Occupational History   Not on file  Tobacco Use   Smoking status: Former Smoker   Smokeless tobacco: Never Used  Scientific laboratory technician Use: Never used  Substance and Sexual Activity   Alcohol use: Yes    Comment: occasional   Drug use: Never   Sexual activity: Not on file  Other Topics Concern   Not on file  Social History Narrative   Not on file   Social Determinants of Health   Financial Resource Strain:    Difficulty of Paying Living Expenses: Not on file  Food Insecurity:    Worried About [place] in the Last Year: Not on file   Ran Out of Food in the Last Year: Not on file  Transportation Needs:    Lack of Transportation (Medical): Not on file   Lack of Transportation (Non-Medical): Not on file  Physical Activity:    Days of Exercise per Week: Not on file   Minutes of Exercise per Session: Not on file  Stress:    Feeling of Stress : Not on file  Social Connections:    Frequency of Communication with Friends and Family: Not on file   Frequency of Social Gatherings with Friends and Family: Not on file   Attends Religious Services: Not on file   Active Member of Clubs or Organizations: Not on file   Attends Archivist Meetings: Not on file   Marital Status: Not on file  Intimate Partner Violence:    Fear of Current or Ex-Partner: Not on file   Emotionally Abused: Not on file   Physically Abused: Not on file   Sexually Abused: Not on file    Prior to Admission medications   Medication Sig Start Date End Date Taking? Authorizing Provider  aspirin EC 81 MG tablet Take by mouth. 02/23/14  Yes [provider]  atorvastatin (LIPITOR) 40 MG tablet Take 40 mg by mouth every evening.  07/25/12  Yes [provider]  B Complex Vitamins (VITAMIN B-COMPLEX) TABS Take 1 tablet by mouth daily.    Yes [provider]  clopidogrel (PLAVIX) 75 MG tablet Take 75 mg by mouth daily.    Yes [provider]  Coenzyme Q10 (CO Q-10) 200 MG CAPS Take 1 tablet by mouth daily.   Yes [provider]  glucosamine-chondroitin 500-400 MG tablet Take 1 tablet by mouth daily.   Yes [provider]  hydrochlorothiazide (HYDRODIURIL) 25 MG tablet Take 25 mg by mouth daily.   Yes [provider]  losartan (COZAAR) 100 MG tablet Take 100 mg by mouth daily.   Yes [provider]  Multiple Vitamin (MULTIVITAMIN WITH MINERALS) TABS tablet Take 1 tablet by mouth daily.   Yes [provider]  multivitamin-iron-minerals-folic acid (THERAPEUTIC-M) TABS tablet Take 1 tablet by mouth daily.    Yes [provider]  omeprazole (PRILOSEC) 20 MG capsule Take 20 mg by mouth daily.   Yes [provider]  RA KRILL OIL 500 MG CAPS Take by mouth.   Yes [provider]  Turmeric (QC TUMERIC COMPLEX) 500 MG  CAPS Take 1 tablet by mouth daily.   Yes [provider]  Zinc 30 MG CAPS Take 1 capsule by mouth daily.   Yes [provider]  methylPREDNISolone (MEDROL) 4 MG TBPK tablet Take as directed. Patient not taking: Reported on 02/21/2020 09/09/18   Edrick Kins, DPM    Current Facility-Administered Medications  Medication Dose Route Frequency Provider Last Rate Last Admin   0.9 %  sodium chloride infusion  250 mL Intravenous PRN Furth, Cadence H, PA-C       0.9 %  sodium chloride infusion    Continuous PRN Belva Crome, MD 999 mL/hr at 02/22/20 1501 300 mL at 02/22/20 1501   0.9% sodium chloride infusion  1 mL/kg/hr Intravenous Continuous Burnell Blanks, MD 70.2 mL/hr at 02/22/20 1131 1 mL/kg/hr at 02/22/20 1131   [MAR Hold] acetaminophen (TYLENOL) tablet 650 mg  650 mg Oral Q4H PRN Nipp, Carriel T, MD       aspirin chewable tablet 81 mg  81 mg Oral Pre-Cath Furth, Cadence H, PA-C       [MAR Hold] aspirin EC tablet 81 mg  81 mg Oral Daily Nipp, Carriel T, MD   81 mg at 02/22/20 0837   [MAR Hold]  atorvastatin (LIPITOR) tablet 80 mg  80 mg Oral Daily Nipp, Carriel T, MD   80 mg at 02/22/20 0837   [MAR Hold] carvedilol (COREG) tablet 6.25 mg  6.25 mg Oral BID WC Nipp, Carriel T, MD   6.25 mg at 02/22/20 0553   fentaNYL (SUBLIMAZE) injection    PRN Belva Crome, MD   50 mcg at 02/22/20 1507   Heparin (Porcine) in NaCl 1000-0.9 UT/500ML-% SOLN    PRN Belva Crome, MD   500 mL at 02/22/20 1442   heparin ADULT infusion 100 units/mL (25000 units/214mL sodium chloride 0.45%)  850 Units/hr Intravenous Continuous Nipp, Carriel T, MD   Stopped at 02/22/20 1356   heparin sodium (porcine) injection    PRN Belva Crome, MD   4,000 Units at 02/22/20 1441   iohexol (OMNIPAQUE) 350 MG/ML injection    PRN Belva Crome, MD   50 mL at 02/22/20 1455   lidocaine (PF) (XYLOCAINE) 1 % injection    PRN Belva Crome, MD   2 mL at 02/22/20 1440   [MAR Hold] losartan (COZAAR) tablet 100 mg  100 mg Oral Daily Nipp, Carriel T, MD   100 mg at 02/22/20 0837   midazolam (VERSED) injection    PRN Belva Crome, MD   1 mg at 02/22/20 1438   [MAR Hold] nitroGLYCERIN (NITROSTAT) SL tablet 0.4 mg  0.4 mg Sublingual Q5 Min x 3 PRN Nipp, Carriel T, MD   0.4 mg at 02/22/20 1011   nitroGLYCERIN 50 mg in dextrose 5 % 250 mL (0.2 mg/mL) infusion    Continuous PRN Belva Crome, MD 3 mL/hr at 02/22/20 1502 10 mcg/min at 02/22/20 1502   [MAR Hold] ondansetron (ZOFRAN) injection 4 mg  4 mg Intravenous Q6H PRN Nipp, Carriel T, MD       Radial Cocktail/Verapamil only    PRN Belva Crome, MD   10 mL at 02/22/20 1440   [MAR Hold] sodium chloride flush (NS) 0.9 % injection 3 mL  3 mL Intravenous Q12H Furth, Cadence H, PA-C       sodium chloride flush (NS) 0.9 % injection 3 mL  3 mL Intravenous PRN Furth, Cadence H, PA-C  No Known Allergies    Review of Systems:  As per HPI - remainder non-contributory     Physical Exam:   BP 111/64 (BP Location: Left Arm)    Pulse (!) 54    Temp 97.9 F (36.6 C)     Resp 16    Ht 5\' 3"  (1.6 m)    Wt 70.2 kg    SpO2 97%    BMI 27.40 kg/m   General:  WDWN male in cath lab NAD  HEENT:  Unremarkable   Neck:   no JVD, no bruits, no adenopathy   Chest:   clear to auscultation, symmetrical breath sounds, no wheezes, no rhonchi   CV:   RRR, grade III/VI systolic murmur   Abdomen:  soft, non-tender, no masses   Extremities:  warm, well-perfused, no lower extremity edema  Rectal/GU  Deferred  Neuro:   Grossly non-focal and symmetrical throughout  Skin:   Clean and dry, no rashes, no breakdown  Diagnostic Tests:  Lab Results: Recent Labs    02/21/20 1555 02/22/20 0208  WBC 7.8 7.4  HGB 14.3 13.2  HCT 41.1 38.6*  PLT 142* 136*   BMET:  Recent Labs    02/21/20 1555 02/22/20 0208  NA 139 142  K 3.1* 3.8  CL 103 103  CO2 23 30  GLUCOSE 187* 137*  BUN 11 12  CREATININE 0.88 0.92  CALCIUM 9.4 9.2    CBG (last 3)  No results for input(s): GLUCAP in the last 72 hours. PT/INR:   Recent Labs    02/22/20 0208  LABPROT 14.6  INR 1.2    CXR:  CHEST - 2 VIEW  COMPARISON:  None  FINDINGS: Normal heart size, mediastinal contours, and pulmonary vascularity.  Mild peribronchial thickening and accentuation of perihilar interstitial markings.  No pulmonary infiltrate, pleural effusion, or pneumothorax.  Endplate spur formation thoracic spine.  IMPRESSION: Bronchitic changes without infiltrate.   Electronically Signed   By: Lavonia Dana M.D.   On: 02/21/2020 16:34    ECHOCARDIOGRAM REPORT       Patient Name:  Alan Mcdonald Date of Exam: 02/22/2020  Medical Rec #: [id number]  Height:    63.0 in  Accession #:  [id number] Weight:    154.7 lb  Date of Birth: [birth year]  BSA:     1.734 m  Patient Age:  74 years  BP:      129/69 mmHg  Patient Gender: M      HR:      57 bpm.  Exam Location: Inpatient   Procedure: 2D Echo, Color Doppler and Cardiac Doppler   Indications:  NSTEMI, AS  i35.0    History:    Patient has prior history of Echocardiogram examinations,  most         recent 05/12/2019. CAD; Risk Factors:Hypertension and         Dyslipidemia. Prior performed at [facility].    Sonographer:  Raquel Sarna Senior RDCS  Referring Phys: [id number] Bricelyn T NIPP     Sonographer Comments: Suboptimal parasternal window.  IMPRESSIONS    1. Left ventricular ejection fraction, by estimation, is 60 to 65%. The  left ventricle has normal function. The left ventricle has no regional  wall motion abnormalities. There is mild left ventricular hypertrophy.  Left ventricular diastolic parameters  are consistent with Grade I diastolic dysfunction (impaired relaxation).  2. Right ventricular systolic function is normal. The right ventricular  size is normal. Tricuspid regurgitation signal is inadequate for assessing  PA  pressure.  3. The mitral valve is grossly normal. Trivial mitral valve  regurgitation.  4. The aortic valve is calcified. Aortic valve regurgitation is not  visualized. Moderate aortic valve stenosis. Aortic valve area, by VTI  measures 1.48 cm. Aortic valve mean gradient measures 13.0 mmHg. Aortic  valve Vmax measures 2.45 m/s. DI is 0.39.  5. The inferior vena cava is normal in size with greater than 50%  respiratory variability, suggesting right atrial pressure of 3 mmHg.   FINDINGS  Left Ventricle: Left ventricular ejection fraction, by estimation, is 60  to 65%. The left ventricle has normal function. The left ventricle has no  regional wall motion abnormalities. The left ventricular internal cavity  size was normal in size. There is  mild left ventricular hypertrophy. Left ventricular diastolic parameters  are consistent with Grade I diastolic dysfunction (impaired relaxation).  Indeterminate filling pressures.   Right Ventricle: The right ventricular size is normal. No increase in  right ventricular wall thickness. Right  ventricular systolic function is  normal. Tricuspid regurgitation signal is inadequate for assessing PA  pressure.   Left Atrium: Left atrial size was normal in size.   Right Atrium: Right atrial size was normal in size.   Pericardium: There is no evidence of pericardial effusion.   Mitral Valve: The mitral valve is grossly normal. There is mild thickening  of the mitral valve leaflet(s). Mild mitral annular calcification. Trivial  mitral valve regurgitation.   Tricuspid Valve: The tricuspid valve is grossly normal. Tricuspid valve  regurgitation is trivial.   Aortic Valve: The aortic valve is calcified. Aortic valve regurgitation is  not visualized. Moderate aortic stenosis is present. Aortic valve mean  gradient measures 13.0 mmHg. Aortic valve peak gradient measures 24.0  mmHg. Aortic valve area, by VTI  measures 1.48 cm.   Pulmonic Valve: The pulmonic valve was grossly normal. Pulmonic valve  regurgitation is not visualized.   Aorta: The aortic root and ascending aorta are structurally normal, with  no evidence of dilitation.   Venous: The inferior vena cava is normal in size with greater than 50%  respiratory variability, suggesting right atrial pressure of 3 mmHg.   IAS/Shunts: No atrial level shunt detected by color flow Doppler.     LEFT VENTRICLE  PLAX 2D  LVIDd:     3.70 cm Diastology  LVIDs:     2.30 cm LV e' medial:  6.64 cm/s  LV PW:     1.00 cm LV E/e' medial: 11.2  LV IVS:    1.20 cm LV e' lateral:  4.79 cm/s  LVOT diam:   2.20 cm LV E/e' lateral: 15.5  LV SV:     80  LV SV Index:  46  LVOT Area:   3.80 cm     RIGHT VENTRICLE  RV S prime:   13.80 cm/s  TAPSE (M-mode): 2.7 cm   LEFT ATRIUM       Index    RIGHT ATRIUM      Index  LA diam:    3.20 cm 1.85 cm/m RA Area:   16.10 cm  LA Vol (A2C):  57.7 ml 33.28 ml/m RA Volume:  44.00 ml 25.38 ml/m  LA Vol (A4C):  37.1 ml 21.40 ml/m   LA Biplane Vol: 47.5 ml 27.40 ml/m  AORTIC VALVE  AV Area (Vmax):  1.43 cm  AV Area (Vmean):  1.54 cm  AV Area (VTI):   1.48 cm  AV Vmax:      245.00 cm/s  AV Vmean:     174.000 cm/s  AV VTI:      0.541 m  AV Peak Grad:   24.0 mmHg  AV Mean Grad:   13.0 mmHg  LVOT Vmax:     92.00 cm/s  LVOT Vmean:    70.300 cm/s  LVOT VTI:     0.210 m  LVOT/AV VTI ratio: 0.39    AORTA  Ao Root diam: 3.70 cm  Ao Asc diam: 3.50 cm   MITRAL VALVE  MV Area (PHT): 2.80 cm  SHUNTS  MV Decel Time: 271 msec  Systemic VTI: 0.21 m  MV E velocity: 74.10 cm/s Systemic Diam: 2.20 cm  MV A velocity: 99.00 cm/s  MV E/A ratio: 0.75   Lyman Bishop MD  Electronically signed by Lyman Bishop MD  Signature Date/Time: 02/22/2020/11:58:47 AM     LEFT HEART CATH AND CORONARY ANGIOGRAPHY  Conclusion   Severe distal left main disease  Proximal LAD 60-70% and 80% mid before large diagonal.  95% ostial circumflex stenosis and 70% dominant obtuse marginal proximal stenosis.  Diffuse proximal distal 60% stenosis some of which is diffuse in-stent.  Faint septal perforator collaterals emanate from the PDA towards the LAD which is not opacified.  LV function is normal.  LVEDP is 8 mmHg.  Heavily calcified aortic valve with 15 mm mean gradient on pullback.  RECOMMENDATIONS:   He developed prolonged chest pain immediately post cath.  IV fluid bolus was administered, IV nitroglycerin started, and 50 mcg of fentanyl were given.  The pain gradually resolved over 15 to 20 minutes.  In lab consult with Dr. Roxy Manns we decided to do urgent CABG on the patient.  Initial plan to place intra-aortic balloon pump was decided against after decision for urgent surgery. Indications  Coronary artery disease involving native coronary artery of native heart with unstable angina pectoris ([place]) [I25.110 (ICD-10-CM)]  Procedural Details  Technical Details The right radial  area was sterilely prepped and draped. Intravenous sedation with Versed and fentanyl was administered. 1% Xylocaine was infiltrated to achieve local analgesia. Using real-time vascular ultrasound, a double wall stick with an angiocath was utilized to obtain intra-arterial access. A VUS image was saved for the record.The modified Seldinger technique was used to place a 40F " Slender" sheath in the right radial artery. Weight based heparin was administered. Coronary angiography was done using 5 F catheters. Right coronary angiography was performed with a JR4. Left ventricular hemodymic recordings and angiography was done using the JR 4 catheter and hand injection. Left coronary angiography was performed with a JL 3.5 cm.  Hemostasis was achieved using a pneumatic band.  During this procedure the patient is administered a total of Versed 1 mg and Fentanyl 100 mcg to achieve and maintain moderate conscious sedation.  The patient's heart rate, blood pressure, and oxygen saturation are monitored continuously during the procedure. The period of conscious sedation is 45 minutes, of which I was present face-to-face 100% of this time. Estimated blood loss <50 mL.   During this procedure medications were administered to achieve and maintain moderate conscious sedation while the patient's heart rate, blood pressure, and oxygen saturation were continuously monitored and I was present face-to-face 100% of this time.  Medications (Filter: Administrations occurring from 1408 to 1605 on 02/22/20) (important) Continuous medications are totaled by the amount administered until 02/22/20 1605.  midazolam (VERSED) injection (mg) Total dose:  1 mg Date/Time  Rate/Dose/Volume Action  02/22/20 1438  1 mg Given    fentaNYL (  SUBLIMAZE) injection (mcg) Total dose:  100 mcg Date/Time  Rate/Dose/Volume Action  02/22/20 1438  50 mcg Given  1507  50 mcg Given    lidocaine (PF) (XYLOCAINE) 1 % injection (mL) Total volume:  2  mL Date/Time  Rate/Dose/Volume Action  02/22/20 1440  2 mL Given    Radial Cocktail/Verapamil only (mL) Total volume:  10 mL Date/Time  Rate/Dose/Volume Action  02/22/20 1440  10 mL Given    heparin sodium (porcine) injection (Units) Total dose:  4,000 Units Date/Time  Rate/Dose/Volume Action  02/22/20 1441  4,000 Units Given    Heparin (Porcine) in NaCl 1000-0.9 UT/500ML-% SOLN (mL) Total volume:  1,000 mL Date/Time  Rate/Dose/Volume Action  02/22/20 1442  500 mL Given  1442  500 mL Given    iohexol (OMNIPAQUE) 350 MG/ML injection (mL) Total volume:  50 mL Date/Time  Rate/Dose/Volume Action  02/22/20 1455  50 mL Given    0.9 % sodium chloride infusion (mL) Total dose:  Cannot be calculated* *Continuous medication not stopped within the calculation time range. Date/Time  Rate/Dose/Volume Action  02/22/20 1501  300 mL - 999 mL/hr New Bag/Given    nitroGLYCERIN 50 mg in dextrose 5 % 250 mL (0.2 mg/mL) infusion (mcg/min) Total dose:  Cannot be calculated* Dosing weight:  70.2 *Continuous medication not stopped within the calculation time range. Date/Time  Rate/Dose/Volume Action  02/22/20 1502  20 mcg/min - 6 mL/hr New Bag/Given    nitroGLYCERIN (NITROSTAT) SL tablet (mg) Total dose:  0.4 mg Date/Time  Rate/Dose/Volume Action  02/22/20 1500  0.4 mg Given    acetaminophen (TYLENOL) tablet 650 mg (mg) Total dose:  Cannot be calculated* Dosing weight:  72.6 *Administration dose not documented Date/Time  Rate/Dose/Volume Action  02/22/20 1408  *Not included in total MAR Hold    aspirin EC tablet 81 mg (mg) Total dose:  Cannot be calculated* Dosing weight:  72.6 *Administration dose not documented Date/Time  Rate/Dose/Volume Action  02/22/20 1408  *Not included in total MAR Hold    atorvastatin (LIPITOR) tablet 80 mg (mg) Total dose:  Cannot be calculated* Dosing weight:  72.6 *Administration dose not documented Date/Time  Rate/Dose/Volume Action  02/22/20 1408   *Not included in total MAR Hold    carvedilol (COREG) tablet 6.25 mg (mg) Total dose:  Cannot be calculated* Dosing weight:  72.6 *Administration dose not documented Date/Time  Rate/Dose/Volume Action  02/22/20 1408  *Not included in total MAR Hold    losartan (COZAAR) tablet 100 mg (mg) Total dose:  Cannot be calculated* *Administration dose not documented Date/Time  Rate/Dose/Volume Action  02/22/20 1408  *Not included in total MAR Hold    nitroGLYCERIN (NITROSTAT) SL tablet 0.4 mg (mg) Total dose:  Cannot be calculated* Dosing weight:  72.6 *Administration dose not documented Date/Time  Rate/Dose/Volume Action  02/22/20 1408  *Not included in total MAR Hold    ondansetron (ZOFRAN) injection 4 mg (mg) Total dose:  Cannot be calculated* Dosing weight:  72.6 *Administration dose not documented Date/Time  Rate/Dose/Volume Action  02/22/20 1408  *Not included in total MAR Hold    sodium chloride flush (NS) 0.9 % injection 3 mL (mL) Total dose:  Cannot be calculated* Dosing weight:  70.2 *Administration dose not documented Date/Time  Rate/Dose/Volume Action  02/22/20 1408  *Not included in total MAR Hold    nitroGLYCERIN 50 mg in dextrose 5 % 250 mL (0.2 mg/mL) infusion (mcg/min) Total dose:  Cannot be calculated* Dosing weight:  70.2 *Continuous medication not stopped within the  calculation time range. Date/Time  Rate/Dose/Volume Action  02/22/20 1605  20 mcg/min - 6 mL/hr New Bag/Given    Sedation Time  Sedation Time Physician-1: 55 minutes 17 seconds  Contrast  Medication Name Total Dose  iohexol (OMNIPAQUE) 350 MG/ML injection 50 mL    Radiation/Fluoro  Fluoro time: 3.6 (min) DAP: 50932 (mGycm2) Cumulative Air Kerma: 270 (mGy)  Coronary Findings  Diagnostic Dominance: Right Left Main  Mid LM to Dist LM lesion is 99% stenosed.  Left Anterior Descending  There is moderate diffuse disease throughout the vessel.  Ost LAD to Prox LAD lesion is 75% stenosed.   Mid LAD lesion is 80% stenosed.  First Diagonal Branch  Vessel is small in size.  Second Septal Branch  Left Circumflex  Vessel is moderate in size.  Ost Cx to Prox Cx lesion is 95% stenosed.  First Obtuse Marginal Branch  The vessel exhibits minimal luminal irregularities.  1st Mrg lesion is 70% stenosed.  Second Obtuse Marginal Branch  Vessel is small in size.  Right Coronary Artery  There is mild diffuse disease throughout the vessel.  Prox RCA lesion is 45% stenosed.  Prox RCA to Mid RCA lesion is 60% stenosed. The lesion was previously treated.  Dist RCA lesion is 50% stenosed.  First Right Posterolateral Branch  Vessel is small in size.  Second Right Posterolateral Branch  Vessel is large in size.  Third Right Posterolateral Branch  Vessel is small in size.  Intervention  No interventions have been documented. Wall Motion  Resting               Left Heart  Left Ventricle The left ventricular size is normal. The left ventricular systolic function is normal. LV end diastolic pressure is normal. The left ventricular ejection fraction is greater than 65% by visual estimate. No regional wall motion abnormalities.  Coronary Diagrams  Diagnostic Dominance: Right  Intervention  Implants   No implant documentation for this case.  Syngo Images  Show images for CARDIAC CATHETERIZATION Images on Long Term Storage  Show images for Crable, Amour Cutrone to Procedure Log  Procedure Log    Hemo Data   Most Recent Value  Aortic Mean Gradient 15.34 mmHg  Aortic Peak Gradient 11 mmHg  AO Systolic Pressure 671 mmHg  AO Diastolic Pressure 52 mmHg  AO Mean 76 mmHg  LV Systolic Pressure 245 mmHg  LV Diastolic Pressure 1 mmHg  LV EDP 8 mmHg  AOp Systolic Pressure 809 mmHg  AOp Diastolic Pressure 48 mmHg  AOp Mean Pressure 74 mmHg  LVp Systolic Pressure 983 mmHg  LVp Diastolic Pressure 3 mmHg  LVp EDP Pressure 8 mmHg     Impression:  Patient has critical  left main coronary artery disease with at least mild aortic stenosis and normal left ventricular systolic function who was admitted to the hospital with acute coronary syndrome, has ruled in for non-ST segment elevation myocardial infarction, and has had some ongoing chest pain at rest during cardiac catheterization which has now resolved with intravenous nitroglycerin.  I have personally reviewed the patient's transthoracic echocardiogram and diagnostic cardiac catheterization.  Echocardiogram reveals normal left ventricular systolic function.  The aortic valve appears trileaflet with at least moderate thickening and calcification of the leaflets.  Peak velocity across aortic valve measured 2.45 m/s corresponding to mean transvalvular gradient estimated 13 mmHg and aortic valve area calculated 1.48 cm by VTI.  The DVI was reported 0.39.  Catheterization reveals critical 95 to 99% hazy high-grade distal  stenosis of the left main coronary artery with ostial stenosis of both the left anterior descending coronary artery and the left circumflex coronary artery.  There is moderate nonobstructive disease in the right coronary territory.  Mean transvalvular gradient across the aortic valve was measured 15 mmHg at catheterization.  Right heart catheterization was not performed.  I agree the patient needs prompt surgical revascularization.  I would favor reevaluation of the severity of aortic stenosis using transesophageal echocardiogram and possible concomitant aortic valve replacement at the time of surgery depending on findings.  The patient will be at somewhat increased risk for bleeding complications.  Plan:  I have reviewed the indications, risks, and potential benefits of coronary artery bypass grafting with the patient on the cath lab table.  Alternative treatment strategies have been discussed, including the relative risks, benefits and long term prognosis associated with medical therapy, percutaneous coronary  intervention, and surgical revascularization.  We also discussed the presence of at least mild to moderate aortic stenosis and the possibility that he might benefit from concomitant aortic valve replacement depending on intraoperative findings.  Under the circumstances we discussed that based upon his age we would typically favor valve replacement using a bioprosthetic tissue valve rather than a mechanical valve.  We discussed the timing of surgery and the fact that he may be at slightly increased risk for bleeding complications due to preoperative dual antiplatelet therapy.  The patient understands and accepts all potential associated risks of surgery including but not limited to risk of death, stroke or other neurologic complication, myocardial infarction, congestive heart failure, respiratory failure, renal failure, bleeding requiring blood transfusion and/or reexploration, aortic dissection or other major vascular complication, arrhythmia, heart block or bradycardia requiring permanent pacemaker, pneumonia, pleural effusion, wound infection, pulmonary embolus or other thromboembolic complication, chronic pain or other delayed complications related to median sternotomy, or the late recurrence of symptomatic ischemic heart disease and/or congestive heart failure.   All questions answered.     I spent in excess of 60 minutes during the conduct of this hospital consultation and >50% of this time involved direct face-to-face encounter for counseling and/or coordination of the patient's care.    Valentina Gu. Roxy Manns, MD 02/22/2020 3:33 PM

## 2020-02-22 NOTE — Transfer of Care (Signed)
Immediate Anesthesia Transfer of Care Note  Patient: Alan Mcdonald  Procedure(s) Performed: CORONARY ARTERY BYPASS GRAFTING (CABG) USING LIMA to LAD; ENDSCOPICALLY HARVEST RIGHT GREATER SAPHENOUS VEIN: SVG to OM1 (N/A Chest) AORTIC VALVE REPLACEMENT (AVR) USING EDWARDS Resilia 25 MM AORTIC VALVE. (N/A Chest) TRANSESOPHAGEAL ECHOCARDIOGRAM (TEE) (N/A ) ENDOVEIN HARVEST OF GREATER SAPHENOUS VEIN (Right Leg Upper)  Patient Location: SICU  Anesthesia Type:General  Level of Consciousness: Patient remains intubated per anesthesia plan  Airway & Oxygen Therapy: Patient placed on Ventilator (see vital sign flow sheet for setting)  Post-op Assessment: Report given to RN and Post -op Vital signs reviewed and stable  Post vital signs: Reviewed and stable  Last Vitals:  Vitals Value Taken Time  BP    Temp    Pulse    Resp    SpO2      Last Pain:  Vitals:   02/22/20 1526  TempSrc:   PainSc: 0-No pain         Complications: No complications documented.

## 2020-02-22 NOTE — Progress Notes (Signed)
  Echocardiogram Echocardiogram Transesophageal has been performed.  Alan Mcdonald 02/22/2020, 4:44 PM

## 2020-02-22 NOTE — Anesthesia Postprocedure Evaluation (Signed)
Anesthesia Post Note  Patient: Alan Mcdonald  Procedure(s) Performed: CORONARY ARTERY BYPASS GRAFTING (CABG) USING LIMA to LAD; ENDSCOPICALLY HARVEST RIGHT GREATER SAPHENOUS VEIN: SVG to OM1 (N/A Chest) AORTIC VALVE REPLACEMENT (AVR) USING EDWARDS Resilia 25 MM AORTIC VALVE. (N/A Chest) TRANSESOPHAGEAL ECHOCARDIOGRAM (TEE) (N/A ) ENDOVEIN HARVEST OF GREATER SAPHENOUS VEIN (Right Leg Upper)     Patient location during evaluation: SICU Anesthesia Type: General Level of consciousness: sedated and patient remains intubated per anesthesia plan Pain management: pain level controlled Vital Signs Assessment: post-procedure vital signs reviewed and stable Respiratory status: patient remains intubated per anesthesia plan and patient on ventilator - see flowsheet for VS Cardiovascular status: stable Anesthetic complications: no   No complications documented.  Last Vitals:  Vitals:   02/22/20 1559 02/22/20 2215  BP:    Pulse: (!) 0   Resp: (!) 0   Temp:  (!) 36.1 C  SpO2: (!) 0%     Last Pain:  Vitals:   02/22/20 2215  TempSrc: Esophageal  PainSc:                  Jodine Muchmore COKER

## 2020-02-22 NOTE — Progress Notes (Signed)
Echocardiogram 2D Echocardiogram has been performed.  Oneal Deputy Sumeya Yontz 02/22/2020, 9:47 AM

## 2020-02-23 ENCOUNTER — Inpatient Hospital Stay (HOSPITAL_COMMUNITY): Payer: No Typology Code available for payment source

## 2020-02-23 ENCOUNTER — Encounter (HOSPITAL_COMMUNITY): Payer: Self-pay | Admitting: Interventional Cardiology

## 2020-02-23 LAB — POCT I-STAT 7, (LYTES, BLD GAS, ICA,H+H)
Acid-base deficit: 3 mmol/L — ABNORMAL HIGH (ref 0.0–2.0)
Acid-base deficit: 3 mmol/L — ABNORMAL HIGH (ref 0.0–2.0)
Acid-base deficit: 4 mmol/L — ABNORMAL HIGH (ref 0.0–2.0)
Acid-base deficit: 4 mmol/L — ABNORMAL HIGH (ref 0.0–2.0)
Bicarbonate: 21 mmol/L (ref 20.0–28.0)
Bicarbonate: 22.5 mmol/L (ref 20.0–28.0)
Bicarbonate: 23.3 mmol/L (ref 20.0–28.0)
Bicarbonate: 23.4 mmol/L (ref 20.0–28.0)
Calcium, Ion: 1.13 mmol/L — ABNORMAL LOW (ref 1.15–1.40)
Calcium, Ion: 1.14 mmol/L — ABNORMAL LOW (ref 1.15–1.40)
Calcium, Ion: 1.15 mmol/L (ref 1.15–1.40)
Calcium, Ion: 1.2 mmol/L (ref 1.15–1.40)
HCT: 34 % — ABNORMAL LOW (ref 39.0–52.0)
HCT: 35 % — ABNORMAL LOW (ref 39.0–52.0)
HCT: 39 % (ref 39.0–52.0)
HCT: 41 % (ref 39.0–52.0)
Hemoglobin: 11.6 g/dL — ABNORMAL LOW (ref 13.0–17.0)
Hemoglobin: 11.9 g/dL — ABNORMAL LOW (ref 13.0–17.0)
Hemoglobin: 13.3 g/dL (ref 13.0–17.0)
Hemoglobin: 13.9 g/dL (ref 13.0–17.0)
O2 Saturation: 91 %
O2 Saturation: 94 %
O2 Saturation: 96 %
O2 Saturation: 97 %
Patient temperature: 36.2
Patient temperature: 36.7
Patient temperature: 36.7
Patient temperature: 36.9
Potassium: 3.7 mmol/L (ref 3.5–5.1)
Potassium: 4 mmol/L (ref 3.5–5.1)
Potassium: 4.3 mmol/L (ref 3.5–5.1)
Potassium: 4.4 mmol/L (ref 3.5–5.1)
Sodium: 142 mmol/L (ref 135–145)
Sodium: 142 mmol/L (ref 135–145)
Sodium: 142 mmol/L (ref 135–145)
Sodium: 143 mmol/L (ref 135–145)
TCO2: 22 mmol/L (ref 22–32)
TCO2: 24 mmol/L (ref 22–32)
TCO2: 25 mmol/L (ref 22–32)
TCO2: 25 mmol/L (ref 22–32)
pCO2 arterial: 36.8 mmHg (ref 32.0–48.0)
pCO2 arterial: 39 mmHg (ref 32.0–48.0)
pCO2 arterial: 44.8 mmHg (ref 32.0–48.0)
pCO2 arterial: 47.5 mmHg (ref 32.0–48.0)
pH, Arterial: 7.295 — ABNORMAL LOW (ref 7.350–7.450)
pH, Arterial: 7.325 — ABNORMAL LOW (ref 7.350–7.450)
pH, Arterial: 7.364 (ref 7.350–7.450)
pH, Arterial: 7.367 (ref 7.350–7.450)
pO2, Arterial: 103 mmHg (ref 83.0–108.0)
pO2, Arterial: 67 mmHg — ABNORMAL LOW (ref 83.0–108.0)
pO2, Arterial: 71 mmHg — ABNORMAL LOW (ref 83.0–108.0)
pO2, Arterial: 81 mmHg — ABNORMAL LOW (ref 83.0–108.0)

## 2020-02-23 LAB — BASIC METABOLIC PANEL
Anion gap: 7 (ref 5–15)
Anion gap: 10 (ref 5–15)
BUN: 10 mg/dL (ref 8–23)
BUN: 14 mg/dL (ref 8–23)
CO2: 21 mmol/L — ABNORMAL LOW (ref 22–32)
CO2: 22 mmol/L (ref 22–32)
Calcium: 7.5 mg/dL — ABNORMAL LOW (ref 8.9–10.3)
Calcium: 8 mg/dL — ABNORMAL LOW (ref 8.9–10.3)
Chloride: 111 mmol/L (ref 98–111)
Chloride: 107 mmol/L (ref 98–111)
Creatinine, Ser: 0.79 mg/dL (ref 0.61–1.24)
Creatinine, Ser: 1.02 mg/dL (ref 0.61–1.24)
GFR calc Af Amer: >60 mL/min (ref >60)
GFR calc Af Amer: >60 mL/min (ref >60)
GFR calc non Af Amer: >60 mL/min (ref >60)
GFR calc non Af Amer: >60 mL/min (ref >60)
Glucose, Bld: 143 mg/dL — ABNORMAL HIGH (ref 70–99)
Glucose, Bld: 165 mg/dL — ABNORMAL HIGH (ref 70–99)
Potassium: 4 mmol/L (ref 3.5–5.1)
Potassium: 3.7 mmol/L (ref 3.5–5.1)
Sodium: 139 mmol/L (ref 135–145)
Sodium: 139 mmol/L (ref 135–145)

## 2020-02-23 LAB — CBC
HCT: 37.4 % — ABNORMAL LOW (ref 39.0–52.0)
HCT: 33.3 % — ABNORMAL LOW (ref 39.0–52.0)
Hemoglobin: 12.9 g/dL — ABNORMAL LOW (ref 13.0–17.0)
Hemoglobin: 11.3 g/dL — ABNORMAL LOW (ref 13.0–17.0)
MCH: 31.6 pg (ref 26.0–34.0)
MCH: 31 pg (ref 26.0–34.0)
MCHC: 34.5 g/dL (ref 30.0–36.0)
MCHC: 33.9 g/dL (ref 30.0–36.0)
MCV: 91.7 fL (ref 80.0–100.0)
MCV: 91.5 fL (ref 80.0–100.0)
Platelets: 131 10*3/uL — ABNORMAL LOW (ref 150–400)
Platelets: 122 10*3/uL — ABNORMAL LOW (ref 150–400)
RBC: 4.08 MIL/uL — ABNORMAL LOW (ref 4.22–5.81)
RBC: 3.64 MIL/uL — ABNORMAL LOW (ref 4.22–5.81)
RDW: 13.6 % (ref 11.5–15.5)
RDW: 14 % (ref 11.5–15.5)
WBC: 16.7 10*3/uL — ABNORMAL HIGH (ref 4.0–10.5)
WBC: 17.6 10*3/uL — ABNORMAL HIGH (ref 4.0–10.5)
nRBC: 0 % (ref 0.0–0.2)
nRBC: 0 % (ref 0.0–0.2)

## 2020-02-23 LAB — PREPARE PLATELET PHERESIS
Unit division: 0
Unit division: 0

## 2020-02-23 LAB — BPAM PLATELET PHERESIS
Blood Product Expiration Date: 202109152359
Blood Product Expiration Date: 202109152359
ISSUE DATE / TIME: 202109132132
ISSUE DATE / TIME: 202109132132
Unit Type and Rh: 600
Unit Type and Rh: 6200

## 2020-02-23 LAB — MAGNESIUM
Magnesium: 2.3 mg/dL (ref 1.7–2.4)
Magnesium: 1.8 mg/dL (ref 1.7–2.4)

## 2020-02-23 LAB — ECHO INTRAOPERATIVE TEE
AV Mean grad: 15 mmHg
Height: 63 in
IVS: 1.4 cm — AB (ref 0.6–1.1)
LVOT diameter: 22 mm
Mean grad: 1 mmHg
Weight: 2475.2 oz

## 2020-02-23 LAB — GLUCOSE, CAPILLARY
Glucose-Capillary: 104 mg/dL — ABNORMAL HIGH (ref 70–99)
Glucose-Capillary: 119 mg/dL — ABNORMAL HIGH (ref 70–99)
Glucose-Capillary: 121 mg/dL — ABNORMAL HIGH (ref 70–99)
Glucose-Capillary: 124 mg/dL — ABNORMAL HIGH (ref 70–99)
Glucose-Capillary: 133 mg/dL — ABNORMAL HIGH (ref 70–99)
Glucose-Capillary: 138 mg/dL — ABNORMAL HIGH (ref 70–99)
Glucose-Capillary: 138 mg/dL — ABNORMAL HIGH (ref 70–99)
Glucose-Capillary: 142 mg/dL — ABNORMAL HIGH (ref 70–99)
Glucose-Capillary: 147 mg/dL — ABNORMAL HIGH (ref 70–99)
Glucose-Capillary: 149 mg/dL — ABNORMAL HIGH (ref 70–99)
Glucose-Capillary: 163 mg/dL — ABNORMAL HIGH (ref 70–99)
Glucose-Capillary: 163 mg/dL — ABNORMAL HIGH (ref 70–99)
Glucose-Capillary: 83 mg/dL (ref 70–99)
Glucose-Capillary: 97 mg/dL (ref 70–99)

## 2020-02-23 IMAGING — DX DG CHEST 1V PORT
1 series · 1 of 1 positions shown · non-contrast
Comparison: [DATE]

CLINICAL DATA: Chest tube

EXAM:
PORTABLE CHEST 1 VIEW

[chest]
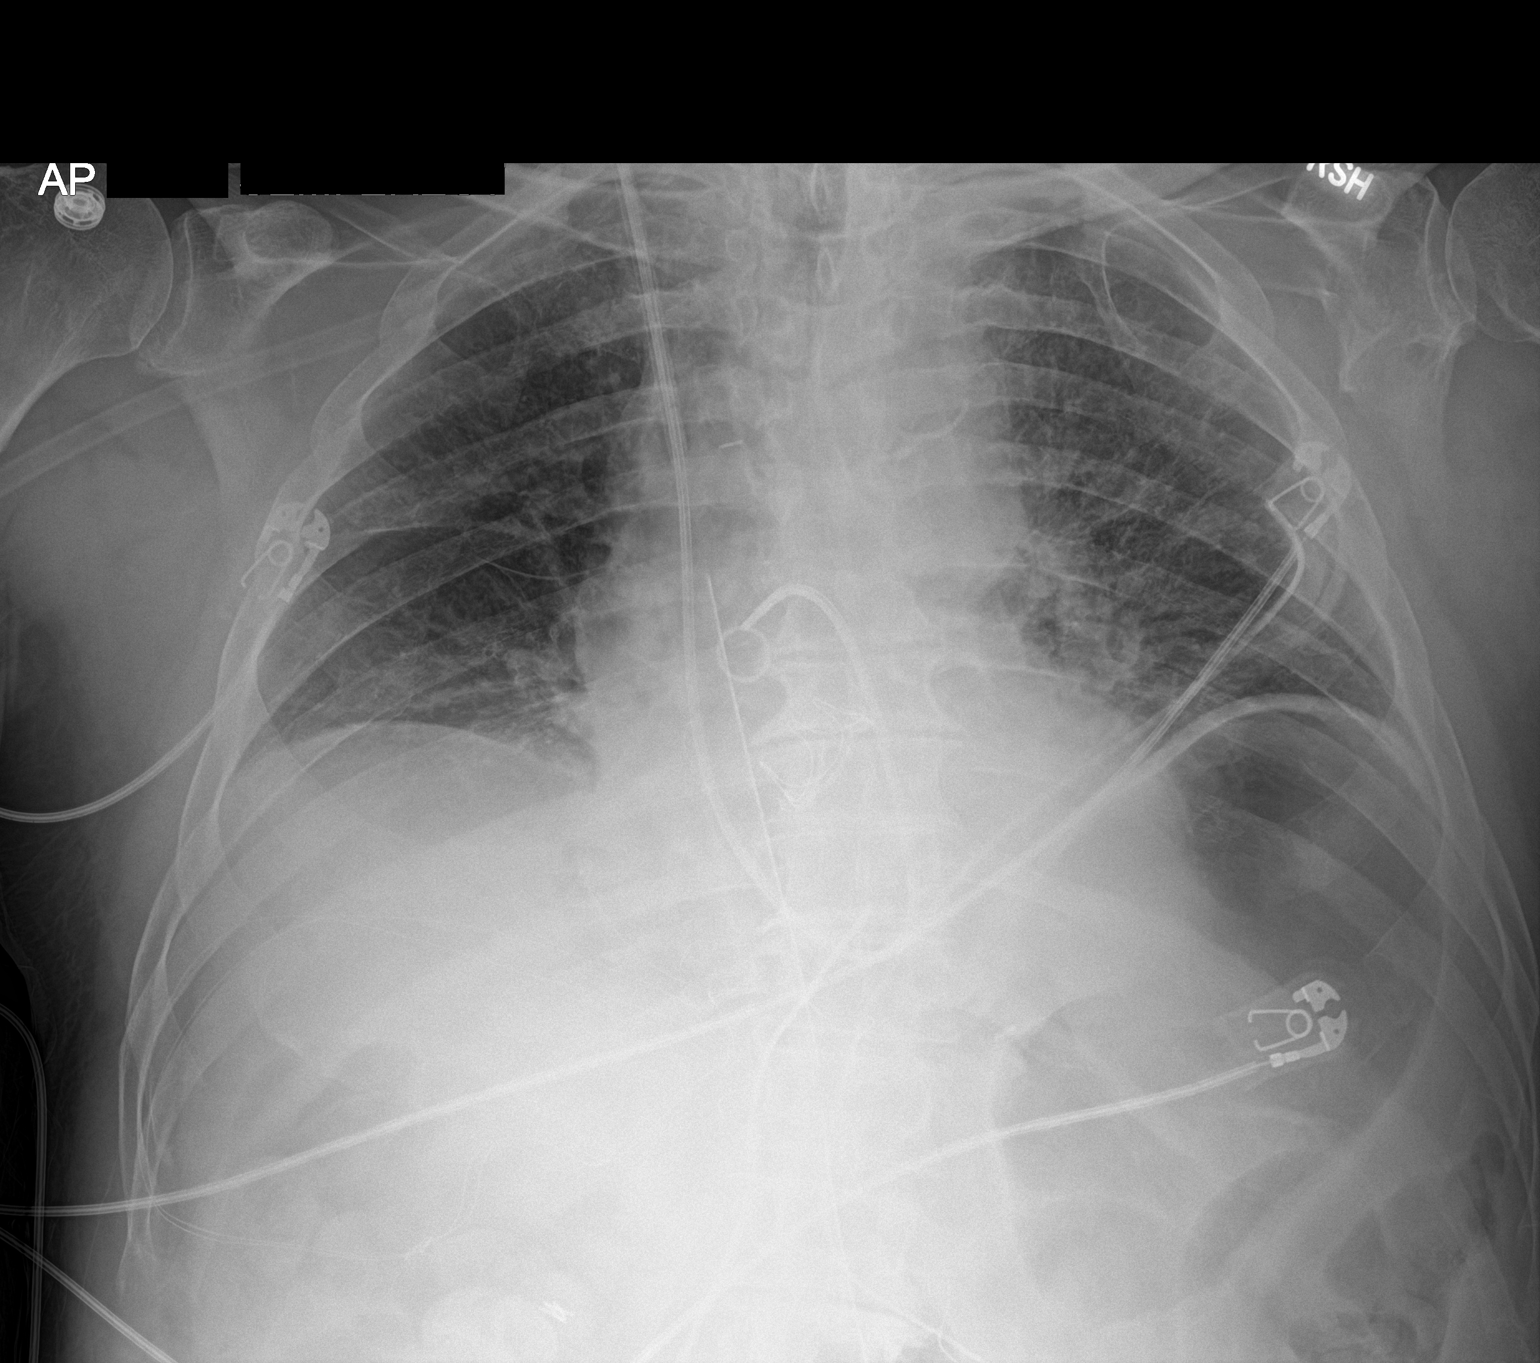

[1 of 1 positions shown; findings below may reference images not displayed]

FINDINGS: Interval extubation and removal of NG tube. Swan-Ganz catheter and
left chest tube remain in place, unchanged. No pneumothorax.
Vascular congestion and bibasilar atelectasis with low lung volumes,
similar to prior study.
IMPRESSION: Interval extubation. Low lung volumes with vascular congestion and
bibasilar atelectasis.

## 2020-02-23 MED ORDER — POTASSIUM CHLORIDE CRYS ER 20 MEQ PO TBCR
20.0000 meq | EXTENDED_RELEASE_TABLET | ORAL | Status: AC
  Administered 2020-02-23 – 2020-02-24 (×3): 20 meq via ORAL
  Filled 2020-02-23 (×3): qty 1

## 2020-02-23 MED ORDER — ENOXAPARIN SODIUM 30 MG/0.3ML ~~LOC~~ SOLN
30.0000 mg | Freq: Every day | SUBCUTANEOUS | Status: DC
  Administered 2020-02-24 – 2020-02-25 (×2): 30 mg via SUBCUTANEOUS
  Filled 2020-02-23 (×2): qty 0.3

## 2020-02-23 MED ORDER — FUROSEMIDE 10 MG/ML IJ SOLN
20.0000 mg | Freq: Once | INTRAMUSCULAR | Status: AC
  Administered 2020-02-23: 20 mg via INTRAVENOUS
  Filled 2020-02-23: qty 2

## 2020-02-23 MED ORDER — ASPIRIN EC 325 MG PO TBEC
325.0000 mg | DELAYED_RELEASE_TABLET | Freq: Every day | ORAL | Status: AC
  Administered 2020-02-23: 325 mg via ORAL
  Filled 2020-02-23: qty 1

## 2020-02-23 MED ORDER — ATORVASTATIN CALCIUM 80 MG PO TABS
80.0000 mg | ORAL_TABLET | Freq: Every evening | ORAL | Status: DC
  Administered 2020-02-24 – 2020-02-25 (×2): 80 mg via ORAL
  Filled 2020-02-23 (×2): qty 1

## 2020-02-23 MED ORDER — CLOPIDOGREL BISULFATE 75 MG PO TABS
75.0000 mg | ORAL_TABLET | Freq: Every day | ORAL | Status: DC
  Administered 2020-02-24 – 2020-02-26 (×3): 75 mg via ORAL
  Filled 2020-02-23 (×3): qty 1

## 2020-02-23 MED ORDER — INSULIN ASPART 100 UNIT/ML ~~LOC~~ SOLN
0.0000 [IU] | SUBCUTANEOUS | Status: DC
  Administered 2020-02-23: 2 [IU] via SUBCUTANEOUS
  Administered 2020-02-23: 4 [IU] via SUBCUTANEOUS
  Administered 2020-02-23 – 2020-02-24 (×2): 2 [IU] via SUBCUTANEOUS

## 2020-02-23 MED ORDER — ASPIRIN EC 81 MG PO TBEC
81.0000 mg | DELAYED_RELEASE_TABLET | Freq: Every day | ORAL | Status: DC
  Administered 2020-02-24 – 2020-02-26 (×3): 81 mg via ORAL
  Filled 2020-02-23 (×3): qty 1

## 2020-02-23 MED FILL — Heparin Sod (Porcine)-NaCl IV Soln 1000 Unit/500ML-0.9%: INTRAVENOUS | Qty: 1000 | Status: AC

## 2020-02-23 NOTE — Plan of Care (Signed)

## 2020-02-23 NOTE — Progress Notes (Addendum)
[facility] DAILY ICU PROGRESS NOTE                   [address]            [place], [address]          [phone]   1 Day Post-Op Procedure(s) (LRB): CORONARY ARTERY BYPASS GRAFTING (CABG) USING LIMA to LAD; ENDSCOPICALLY HARVEST RIGHT GREATER SAPHENOUS VEIN: SVG to OM1 (N/A) AORTIC VALVE REPLACEMENT (AVR) USING EDWARDS Resilia 25 MM AORTIC VALVE. (N/A) TRANSESOPHAGEAL ECHOCARDIOGRAM (TEE) (N/A) ENDOVEIN HARVEST OF GREATER SAPHENOUS VEIN (Right)  Total Length of Stay:  LOS: 2 days   Subjective: Awake and alert  Objective: Vital signs in last 24 hours: Temp:  [97 F (36.1 C)-98.4 F (36.9 C)] 98.1 F (36.7 C) (09/14 0800) Pulse Rate:  [0-81] 80 (09/14 0800) Cardiac Rhythm: Normal sinus rhythm (09/14 0800) Resp:  [0-30] 21 (09/14 0800) BP: (73-164)/(64-104) 99/67 (09/14 0800) SpO2:  [0 %-100 %] 95 % (09/14 0800) Arterial Line BP: (69-135)/(50-86) 80/73 (09/14 0700) FiO2 (%):  [40 %-50 %] 40 % (09/14 0213) Weight:  [78.5 kg] 78.5 kg (09/14 0419)  Filed Weights   02/21/20 2322 02/22/20 0423 02/23/20 0419  Weight: 70 kg 70.2 kg 78.5 kg    Weight change: 5.925 kg   Hemodynamic parameters for last 24 hours: PAP: (21-39)/(10-25) 26/15 CO:  [2.4 L/min-4 L/min] 4 L/min CI:  [1.4 L/min/m2-2.3 L/min/m2] 2.3 L/min/m2  Intake/Output from previous day: 09/13 0701 - 09/14 0700 In: 6354.5 [P.O.:260; I.V.:3055.3; FUXNA:3557; IV Piggyback:1406.2] Out: 4013 [Urine:2390; Blood:1335; Chest Tube:288]  Intake/Output this shift: Total I/O In: 137 [I.V.:137] Out: 190 [Urine:100; Chest Tube:90]  Current Meds: Scheduled Meds: . acetaminophen  1,000 mg Oral Q6H  . aspirin EC  325 mg Oral Daily  . [START ON 02/24/2020] aspirin EC  81 mg Oral Daily  . [START ON 02/24/2020] atorvastatin  80 mg Oral QPM  . bisacodyl  10 mg Oral Daily   Or  . bisacodyl  10 mg Rectal Daily  . Chlorhexidine Gluconate Cloth  6 each Topical Daily  . [START ON 02/24/2020] clopidogrel  75 mg Oral  Daily  . docusate sodium  200 mg Oral Daily  . [START ON 02/24/2020] enoxaparin (LOVENOX) injection  30 mg Subcutaneous QHS  . insulin aspart  0-24 Units Subcutaneous Q4H  . [START ON 02/24/2020] pantoprazole  40 mg Oral Daily  . sodium chloride flush  3 mL Intravenous Q12H   Continuous Infusions: . sodium chloride    . albumin human Stopped (02/23/20 0210)  . cefUROXime (ZINACEF)  IV 1.5 g (02/23/20 0827)  . famotidine (PEPCID) IV Stopped (02/22/20 2347)  . insulin 1.5 mL/hr at 02/23/20 0800  . lactated ringers 20 mL/hr at 02/23/20 0800  . lactated ringers 20 mL/hr at 02/23/20 0800   PRN Meds:.albumin human, dextrose, metoprolol tartrate, morphine injection, ondansetron (ZOFRAN) IV, oxyCODONE, sodium chloride flush, traMADol  General appearance: alert, cooperative, fatigued and no distress Neurologic: intact and moves extrems Heart: regular rate and rhythm Lungs: mildly dim in bases Abdomen: soft, minor tenderness Extremities: no edema Wound: dressings CDI  Lab Results: CBC: Recent Labs    02/22/20 2240 02/22/20 2336 02/23/20 0407 02/23/20 0419  WBC 18.0*  --  16.7*  --   HGB 13.7   < > 12.9* 11.6*  HCT 41.1   < > 37.4* 34.0*  PLT 128*  --  131*  --    < > = values in this interval not displayed.  BMET:  Recent Labs    02/22/20 0208 02/22/20 1639 02/22/20 2052 02/22/20 2235 02/23/20 0407 02/23/20 0419  NA 142   < > 142   < > 139 142  K 3.8   < > 3.4*   < > 4.0 4.0  CL 103   < > 106  --  111  --   CO2 30  --   --   --  21*  --   GLUCOSE 137*   < > 120*  --  143*  --   BUN 12   < > 9  --  10  --   CREATININE 0.92   < > 0.50*  --  0.79  --   CALCIUM 9.2  --   --   --  7.5*  --    < > = values in this interval not displayed.    CMET: Lab Results  Component Value Date   WBC 16.7 (H) 02/23/2020   HGB 11.6 (L) 02/23/2020   HCT 34.0 (L) 02/23/2020   PLT 131 (L) 02/23/2020   GLUCOSE 143 (H) 02/23/2020   CHOL 151 02/22/2020   TRIG 152 (H) 02/22/2020   HDL 37  (L) 02/22/2020   LDLCALC 84 02/22/2020   NA 142 02/23/2020   K 4.0 02/23/2020   CL 111 02/23/2020   CREATININE 0.79 02/23/2020   BUN 10 02/23/2020   CO2 21 (L) 02/23/2020   INR 1.7 (H) 02/22/2020   HGBA1C 5.3 02/22/2020      PT/INR:  Recent Labs    02/22/20 2240  LABPROT 18.9*  INR 1.7*   Radiology: CARDIAC CATHETERIZATION  Result Date: 02/22/2020  Severe distal left main disease  Proximal LAD 60-70% and 80% mid before large diagonal.  95% ostial circumflex stenosis and 70% dominant obtuse marginal proximal stenosis.  Diffuse proximal distal 60% stenosis some of which is diffuse in-stent.  Faint septal perforator collaterals emanate from the PDA towards the LAD which is not opacified.  LV function is normal.  LVEDP is 8 mmHg.  Heavily calcified aortic valve with 15 mm mean gradient on pullback. RECOMMENDATIONS:  He developed prolonged chest pain immediately post cath.  IV fluid bolus was administered, IV nitroglycerin started, and 50 mcg of fentanyl were given.  The pain gradually resolved over 15 to 20 minutes.  In lab consult with Dr. Roxy Manns we decided to do urgent CABG on the patient.  Initial plan to place intra-aortic balloon pump was decided against after decision for urgent surgery.  DG Chest Port 1 View  Result Date: 02/23/2020 CLINICAL DATA:  Chest tube EXAM: PORTABLE CHEST 1 VIEW COMPARISON:  02/22/2020 FINDINGS: Interval extubation and removal of NG tube. Swan-Ganz catheter and left chest tube remain in place, unchanged. No pneumothorax. Vascular congestion and bibasilar atelectasis with low lung volumes, similar to prior study. IMPRESSION: Interval extubation. Low lung volumes with vascular congestion and bibasilar atelectasis. Electronically Signed   By: Rolm Baptise M.D.   On: 02/23/2020 08:10   DG Chest Port 1 View  Result Date: 02/22/2020 CLINICAL DATA:  Status post aortic valve replacement EXAM: PORTABLE CHEST 1 VIEW COMPARISON:  02/21/2020 FINDINGS: Support  Apparatus: --Endotracheal tube: Tip just below the level of the clavicular heads. --Enteric tube:Tip and sideport are below the field of view. --Catheter(s):Right IJ approach Swan-Ganz catheter tip projects over the main pulmonary artery. --Other: Mediastinal drains and left chest tube are present. Lingular atelectasis. No pneumothorax. No sizable pleural effusion. IMPRESSION: Satisfactory appearance of postoperative support apparatus.  Atelectasis in the lingula. Electronically Signed   By: Ulyses Jarred M.D.   On: 02/22/2020 22:52   ECHOCARDIOGRAM COMPLETE  Result Date: 02/22/2020    ECHOCARDIOGRAM REPORT   Patient Name:   Alan Mcdonald Date of Exam: 02/22/2020 Medical Rec #:  [id number]   Height:       63.0 in Accession #:    [id number]  Weight:       154.7 lb Date of Birth:  1951-01-28   BSA:          1.734 m Patient Age:    3 years    BP:           129/69 mmHg Patient Gender: M           HR:           57 bpm. Exam Location:  Inpatient Procedure: 2D Echo, Color Doppler and Cardiac Doppler Indications:    NSTEMI, AS i35.0  History:        Patient has prior history of Echocardiogram examinations, most                 recent 05/12/2019. CAD; Risk Factors:Hypertension and                 Dyslipidemia. Prior performed at [facility].  Sonographer:    Raquel Sarna Senior RDCS Referring Phys: [id number] Leoti T NIPP  Sonographer Comments: Suboptimal parasternal window. IMPRESSIONS  1. Left ventricular ejection fraction, by estimation, is 60 to 65%. The left ventricle has normal function. The left ventricle has no regional wall motion abnormalities. There is mild left ventricular hypertrophy. Left ventricular diastolic parameters are consistent with Grade I diastolic dysfunction (impaired relaxation).  2. Right ventricular systolic function is normal. The right ventricular size is normal. Tricuspid regurgitation signal is inadequate for assessing PA pressure.  3. The mitral valve is grossly normal. Trivial mitral valve regurgitation.   4. The aortic valve is calcified. Aortic valve regurgitation is not visualized. Moderate aortic valve stenosis. Aortic valve area, by VTI measures 1.48 cm. Aortic valve mean gradient measures 13.0 mmHg. Aortic valve Vmax measures 2.45 m/s. DI is 0.39.  5. The inferior vena cava is normal in size with greater than 50% respiratory variability, suggesting right atrial pressure of 3 mmHg. FINDINGS  Left Ventricle: Left ventricular ejection fraction, by estimation, is 60 to 65%. The left ventricle has normal function. The left ventricle has no regional wall motion abnormalities. The left ventricular internal cavity size was normal in size. There is  mild left ventricular hypertrophy. Left ventricular diastolic parameters are consistent with Grade I diastolic dysfunction (impaired relaxation). Indeterminate filling pressures. Right Ventricle: The right ventricular size is normal. No increase in right ventricular wall thickness. Right ventricular systolic function is normal. Tricuspid regurgitation signal is inadequate for assessing PA pressure. Left Atrium: Left atrial size was normal in size. Right Atrium: Right atrial size was normal in size. Pericardium: There is no evidence of pericardial effusion. Mitral Valve: The mitral valve is grossly normal. There is mild thickening of the mitral valve leaflet(s). Mild mitral annular calcification. Trivial mitral valve regurgitation. Tricuspid Valve: The tricuspid valve is grossly normal. Tricuspid valve regurgitation is trivial. Aortic Valve: The aortic valve is calcified. Aortic valve regurgitation is not visualized. Moderate aortic stenosis is present. Aortic valve mean gradient measures 13.0 mmHg. Aortic valve peak gradient measures 24.0 mmHg. Aortic valve area, by VTI measures 1.48 cm. Pulmonic Valve: The pulmonic valve was grossly normal. Pulmonic valve regurgitation is not visualized. Aorta:  The aortic root and ascending aorta are structurally normal, with no evidence  of dilitation. Venous: The inferior vena cava is normal in size with greater than 50% respiratory variability, suggesting right atrial pressure of 3 mmHg. IAS/Shunts: No atrial level shunt detected by color flow Doppler.  LEFT VENTRICLE PLAX 2D LVIDd:         3.70 cm  Diastology LVIDs:         2.30 cm  LV e' medial:    6.64 cm/s LV PW:         1.00 cm  LV E/e' medial:  11.2 LV IVS:        1.20 cm  LV e' lateral:   4.79 cm/s LVOT diam:     2.20 cm  LV E/e' lateral: 15.5 LV SV:         80 LV SV Index:   46 LVOT Area:     3.80 cm  RIGHT VENTRICLE RV S prime:     13.80 cm/s TAPSE (M-mode): 2.7 cm LEFT ATRIUM             Index       RIGHT ATRIUM           Index LA diam:        3.20 cm 1.85 cm/m  RA Area:     16.10 cm LA Vol (A2C):   57.7 ml 33.28 ml/m RA Volume:   44.00 ml  25.38 ml/m LA Vol (A4C):   37.1 ml 21.40 ml/m LA Biplane Vol: 47.5 ml 27.40 ml/m  AORTIC VALVE AV Area (Vmax):    1.43 cm AV Area (Vmean):   1.54 cm AV Area (VTI):     1.48 cm AV Vmax:           245.00 cm/s AV Vmean:          174.000 cm/s AV VTI:            0.541 m AV Peak Grad:      24.0 mmHg AV Mean Grad:      13.0 mmHg LVOT Vmax:         92.00 cm/s LVOT Vmean:        70.300 cm/s LVOT VTI:          0.210 m LVOT/AV VTI ratio: 0.39  AORTA Ao Root diam: 3.70 cm Ao Asc diam:  3.50 cm MITRAL VALVE MV Area (PHT): 2.80 cm    SHUNTS MV Decel Time: 271 msec    Systemic VTI:  0.21 m MV E velocity: 74.10 cm/s  Systemic Diam: 2.20 cm MV A velocity: 99.00 cm/s MV E/A ratio:  0.75 Lyman Bishop MD Electronically signed by Lyman Bishop MD Signature Date/Time: 02/22/2020/11:58:47 AM    Final      Assessment/Plan: S/P Procedure(s) (LRB): CORONARY ARTERY BYPASS GRAFTING (CABG) USING LIMA to LAD; ENDSCOPICALLY HARVEST RIGHT GREATER SAPHENOUS VEIN: SVG to OM1 (N/A) AORTIC VALVE REPLACEMENT (AVR) USING EDWARDS Resilia 25 MM AORTIC VALVE. (N/A) TRANSESOPHAGEAL ECHOCARDIOGRAM (TEE) (N/A) ENDOVEIN HARVEST OF GREATER SAPHENOUS VEIN (Right)  1 doing  well early post op, extubated on no pressors/inotropes with good CI/PAP's. Paced for underlying rate in 50-low 60's- SB with RBBB 2 sats good on 2 liters, routine pulm toilet for atx 3 good UOP, normal renal fxn- cont spont diuresis 4 CT 90 cc this shift so far- keep for now 5 expected ABL anemia- minor- observe 6 transition off insulin gtt 7 Mg++ and K+ normal 8 reactive leukocytosis- observe 9 routine progression orders  10 DAPT  John Giovanni PA-C Pager [phone] 02/23/2020 8:36 AM    I have seen and examined the patient and agree with the assessment and plan as outlined.  Doing well POD1.  Maintaining NSR - AAI paced w/ stable hemodynamics, no drips.  Breathing comfortably w/ O2 sats 95-99% on 2 L/min and CXR looks good, although stomach is dilated w/ gas.  Abdominal exam benign.  EKG w/ NSR and RBBB (old).  Mobilize.  D/C lines.  Resume DAPT, high dose statin.  Continue routine postop  Rexene Alberts, MD 02/23/2020

## 2020-02-23 NOTE — Procedures (Signed)
Extubation Procedure Note  Patient Details:   Name: Alan Mcdonald   DOB: 1951/04/21 MRN: [id number]   Airway Documentation:  Airway 8 mm (Active)  Secured at (cm) 23 cm 02/23/20 0151  Measured From Lips 02/23/20 0151  Secured Location Right 02/23/20 0151  Secured By Pink Tape 02/23/20 0151  Site Condition Dry 02/23/20 0151   Vent end date: 02/23/20 Vent end time: 0246   Evaluation  O2 sats: stable throughout  Complications: No apparent complications Patient did tolerate procedure well. Bilateral Breath Sounds: Clear, Diminished   Yes  Pt extubated to 4 lpm. Pt had positive cuff leak with a -30 NIF and 8.0 VC.  Pt is oriented to time and place.  Patient did verbalize his name.  Patient was instructed on incentive spirometer and was able to give a fair effort.  RN will continue to work with patient throughout his recovery.   Wyman Songster [facility] 02/23/2020, 2:47 AM

## 2020-02-23 NOTE — Addendum Note (Signed)
Addendum  created 02/23/20 0607 by Josephine Igo, CRNA   Order list changed

## 2020-02-23 NOTE — Progress Notes (Signed)
2nd step in wean protocol CP/PS 10/5 40%

## 2020-02-23 NOTE — Progress Notes (Signed)
Anesthesiology Follow-up:  Awake and alert, neuro intact, hemodynamically stable with norepi weaned off. Having some incisional pain. R. radial art line removed per Cath Lab. Chest tube output 50-60 cc/hr  VS: T- 36.7 BP- 124/78 RR 21 HR- 80 (a-paced) O2 Sat 95% on 2L Damar PA - 26/15 CO/CI- 4/2.3  K-4.0 glucose-143 BUN/Cr.- 10/0.79 H/H- 11.6/34.0 Platelets- 131,000  Extubated at 0300 today (4.5  hours post-op)  69 year old male with acute coronary and Non-stemi and coincidental moderate AS, underwent emergent CABG X 2 and AVR. Stable off norepi, plan to mobilize today.  Roberts Gaudy

## 2020-02-23 NOTE — Progress Notes (Signed)
Progress Note  Patient Name: Alan Mcdonald Date of Encounter: 02/23/2020  [facility] Cardiologist: No primary care provider on file. Has been seeing cardiologist at [facility]   In bed, postop soreness.  Thankful.  Denies any significant shortness of breath.  Inpatient Medications    Scheduled Meds: . acetaminophen  1,000 mg Oral Q6H  . aspirin EC  325 mg Oral Daily  . [START ON 02/24/2020] aspirin EC  81 mg Oral Daily  . [START ON 02/24/2020] atorvastatin  80 mg Oral QPM  . bisacodyl  10 mg Oral Daily   Or  . bisacodyl  10 mg Rectal Daily  . Chlorhexidine Gluconate Cloth  6 each Topical Daily  . [START ON 02/24/2020] clopidogrel  75 mg Oral Daily  . docusate sodium  200 mg Oral Daily  . [START ON 02/24/2020] enoxaparin (LOVENOX) injection  30 mg Subcutaneous QHS  . insulin aspart  0-24 Units Subcutaneous Q4H  . [START ON 02/24/2020] pantoprazole  40 mg Oral Daily  . sodium chloride flush  3 mL Intravenous Q12H   Continuous Infusions: . sodium chloride    . albumin human Stopped (02/23/20 0210)  . cefUROXime (ZINACEF)  IV    . famotidine (PEPCID) IV Stopped (02/22/20 2347)  . insulin 1.2 mL/hr at 02/23/20 0600  . lactated ringers 20 mL/hr at 02/23/20 0600  . lactated ringers 50 mL/hr at 02/23/20 0600   PRN Meds: albumin human, dextrose, metoprolol tartrate, morphine injection, ondansetron (ZOFRAN) IV, oxyCODONE, sodium chloride flush, traMADol   Vital Signs    Vitals:   02/23/20 0530 02/23/20 0600 02/23/20 0630 02/23/20 0700  BP:  110/76  124/78  Pulse: 80 80 80 80  Resp: (!) 24 19 (!) 22 19  Temp: 98.2 F (36.8 C) 98.2 F (36.8 C) 98.1 F (36.7 C) 98.1 F (36.7 C)  TempSrc:  Core (Comment)    SpO2: 98% 97% 96% 98%  Weight:      Height:        Intake/Output Summary (Last 24 hours) at 02/23/2020 0721 Last data filed at 02/23/2020 0600 Gross per 24 hour  Intake 6354.45 ml  Output 4013 ml  Net 2341.45 ml   Last 3 Weights 02/23/2020 02/22/2020 02/21/2020   Weight (lbs) 173 lb 1 oz 154 lb 11.2 oz 154 lb 6.4 oz  Weight (kg) 78.5 kg 70.171 kg 70.035 kg      Telemetry    Atrial pacing, no atrial fibrillation- Personally Reviewed  ECG    02/23/2020 sinus bradycardia right bundle branch block heart rate 54 bpm nonspecific ST-T wave changes- Personally Reviewed  Physical Exam   GEN: No acute distress.   Neck: No JVD, Swan-Ganz catheter Cardiac: RRR, no murmurs, rubs, or gallops.  Dressing in place Respiratory: Clear to auscultation bilaterally. GI: Soft, nontender, non-distended  MS:  Mild lower extremity edema; No deformity. Neuro:  Nonfocal  Psych: Normal affect   Labs    High Sensitivity Troponin:   Recent Labs  Lab 02/21/20 1555 02/21/20 2140  TROPONINIHS 293* 418*      Chemistry Recent Labs  Lab 02/21/20 1555 02/21/20 1555 02/22/20 0208 02/22/20 1639 02/22/20 2019 02/22/20 2032 02/22/20 2052 02/22/20 2235 02/23/20 0232 02/23/20 0407 02/23/20 0419  NA 139   < > 142   < > 140   < > 142   < > 143 139 142  K 3.1*   < > 3.8   < > 4.0   < > 3.4*   < >  4.4 4.0 4.0  CL 103   < > 103   < > 105  --  106  --   --  111  --   CO2 23  --  30  --   --   --   --   --   --  21*  --   GLUCOSE 187*   < > 137*   < > 128*  --  120*  --   --  143*  --   BUN 11   < > 12   < > 10  --  9  --   --  10  --   CREATININE 0.88   < > 0.92   < > 0.50*  --  0.50*  --   --  0.79  --   CALCIUM 9.4  --  9.2  --   --   --   --   --   --  7.5*  --   GFRNONAA >60  --  >60  --   --   --   --   --   --  >60  --   GFRAA >60  --  >60  --   --   --   --   --   --  >60  --   ANIONGAP 13  --  9  --   --   --   --   --   --  7  --    < > = values in this interval not displayed.     Hematology Recent Labs  Lab 02/22/20 0208 02/22/20 1639 02/22/20 1934 02/22/20 2019 02/22/20 2240 02/22/20 2336 02/23/20 0232 02/23/20 0407 02/23/20 0419  WBC 7.4  --   --   --  18.0*  --   --  16.7*  --   RBC 4.22  --   --   --  4.47  --   --  4.08*  --   HGB  13.2   < > 8.4*   < > 13.7   < > 11.9* 12.9* 11.6*  HCT 38.6*   < > 24.2*   < > 41.1   < > 35.0* 37.4* 34.0*  MCV 91.5  --   --   --  91.9  --   --  91.7  --   MCH 31.3  --   --   --  30.6  --   --  31.6  --   MCHC 34.2  --   --   --  33.3  --   --  34.5  --   RDW 13.0  --   --   --  13.5  --   --  13.6  --   PLT 136*  --  127*  --  128*  --   --  131*  --    < > = values in this interval not displayed.    BNP Recent Labs  Lab 02/21/20 1555  BNP 147.0*     DDimer No results for input(s): DDIMER in the last 168 hours.   Radiology    DG Chest 2 View  Result Date: 02/21/2020 CLINICAL DATA:  Chest pain, shortness of breath and nausea for 2 months, 2 episodes today, tightness LEFT breast, hypertension, former smoker EXAM: CHEST - 2 VIEW COMPARISON:  None FINDINGS: Normal heart size, mediastinal contours, and pulmonary vascularity. Mild peribronchial thickening and accentuation of perihilar interstitial markings. No pulmonary infiltrate, pleural effusion,  or pneumothorax. Endplate spur formation thoracic spine. IMPRESSION: Bronchitic changes without infiltrate. Electronically Signed   By: Lavonia Dana M.D.   On: 02/21/2020 16:34   CARDIAC CATHETERIZATION  Result Date: 02/22/2020  Severe distal left main disease  Proximal LAD 60-70% and 80% mid before large diagonal.  95% ostial circumflex stenosis and 70% dominant obtuse marginal proximal stenosis.  Diffuse proximal distal 60% stenosis some of which is diffuse in-stent.  Faint septal perforator collaterals emanate from the PDA towards the LAD which is not opacified.  LV function is normal.  LVEDP is 8 mmHg.  Heavily calcified aortic valve with 15 mm mean gradient on pullback. RECOMMENDATIONS:  He developed prolonged chest pain immediately post cath.  IV fluid bolus was administered, IV nitroglycerin started, and 50 mcg of fentanyl were given.  The pain gradually resolved over 15 to 20 minutes.  In lab consult with Dr. Roxy Manns we decided to  do urgent CABG on the patient.  Initial plan to place intra-aortic balloon pump was decided against after decision for urgent surgery.  DG Chest Port 1 View  Result Date: 02/22/2020 CLINICAL DATA:  Status post aortic valve replacement EXAM: PORTABLE CHEST 1 VIEW COMPARISON:  02/21/2020 FINDINGS: Support Apparatus: --Endotracheal tube: Tip just below the level of the clavicular heads. --Enteric tube:Tip and sideport are below the field of view. --Catheter(s):Right IJ approach Swan-Ganz catheter tip projects over the main pulmonary artery. --Other: Mediastinal drains and left chest tube are present. Lingular atelectasis. No pneumothorax. No sizable pleural effusion. IMPRESSION: Satisfactory appearance of postoperative support apparatus. Atelectasis in the lingula. Electronically Signed   By: Ulyses Jarred M.D.   On: 02/22/2020 22:52   ECHOCARDIOGRAM COMPLETE  Result Date: 02/22/2020    ECHOCARDIOGRAM REPORT   Patient Name:   MARCQUIS RIDLON Date of Exam: 02/22/2020 Medical Rec #:  [id number]   Height:       63.0 in Accession #:    [id number]  Weight:       154.7 lb Date of Birth:  [birth year]   BSA:          1.734 m Patient Age:    51 years    BP:           129/69 mmHg Patient Gender: M           HR:           57 bpm. Exam Location:  Inpatient Procedure: 2D Echo, Color Doppler and Cardiac Doppler Indications:    NSTEMI, AS i35.0  History:        Patient has prior history of Echocardiogram examinations, most                 recent 05/12/2019. CAD; Risk Factors:Hypertension and                 Dyslipidemia. Prior performed at [facility].  Sonographer:    Raquel Sarna Senior RDCS Referring Phys: [id number] [place] T NIPP  Sonographer Comments: Suboptimal parasternal window. IMPRESSIONS  1. Left ventricular ejection fraction, by estimation, is 60 to 65%. The left ventricle has normal function. The left ventricle has no regional wall motion abnormalities. There is mild left ventricular hypertrophy. Left ventricular diastolic parameters  are consistent with Grade I diastolic dysfunction (impaired relaxation).  2. Right ventricular systolic function is normal. The right ventricular size is normal. Tricuspid regurgitation signal is inadequate for assessing PA pressure.  3. The mitral valve is grossly normal. Trivial mitral valve regurgitation.  4. The aortic valve is calcified. Aortic  valve regurgitation is not visualized. Moderate aortic valve stenosis. Aortic valve area, by VTI measures 1.48 cm. Aortic valve mean gradient measures 13.0 mmHg. Aortic valve Vmax measures 2.45 m/s. DI is 0.39.  5. The inferior vena cava is normal in size with greater than 50% respiratory variability, suggesting right atrial pressure of 3 mmHg. FINDINGS  Left Ventricle: Left ventricular ejection fraction, by estimation, is 60 to 65%. The left ventricle has normal function. The left ventricle has no regional wall motion abnormalities. The left ventricular internal cavity size was normal in size. There is  mild left ventricular hypertrophy. Left ventricular diastolic parameters are consistent with Grade I diastolic dysfunction (impaired relaxation). Indeterminate filling pressures. Right Ventricle: The right ventricular size is normal. No increase in right ventricular wall thickness. Right ventricular systolic function is normal. Tricuspid regurgitation signal is inadequate for assessing PA pressure. Left Atrium: Left atrial size was normal in size. Right Atrium: Right atrial size was normal in size. Pericardium: There is no evidence of pericardial effusion. Mitral Valve: The mitral valve is grossly normal. There is mild thickening of the mitral valve leaflet(s). Mild mitral annular calcification. Trivial mitral valve regurgitation. Tricuspid Valve: The tricuspid valve is grossly normal. Tricuspid valve regurgitation is trivial. Aortic Valve: The aortic valve is calcified. Aortic valve regurgitation is not visualized. Moderate aortic stenosis is present. Aortic valve mean  gradient measures 13.0 mmHg. Aortic valve peak gradient measures 24.0 mmHg. Aortic valve area, by VTI measures 1.48 cm. Pulmonic Valve: The pulmonic valve was grossly normal. Pulmonic valve regurgitation is not visualized. Aorta: The aortic root and ascending aorta are structurally normal, with no evidence of dilitation. Venous: The inferior vena cava is normal in size with greater than 50% respiratory variability, suggesting right atrial pressure of 3 mmHg. IAS/Shunts: No atrial level shunt detected by color flow Doppler.  LEFT VENTRICLE PLAX 2D LVIDd:         3.70 cm  Diastology LVIDs:         2.30 cm  LV e' medial:    6.64 cm/s LV PW:         1.00 cm  LV E/e' medial:  11.2 LV IVS:        1.20 cm  LV e' lateral:   4.79 cm/s LVOT diam:     2.20 cm  LV E/e' lateral: 15.5 LV SV:         80 LV SV Index:   46 LVOT Area:     3.80 cm  RIGHT VENTRICLE RV S prime:     13.80 cm/s TAPSE (M-mode): 2.7 cm LEFT ATRIUM             Index       RIGHT ATRIUM           Index LA diam:        3.20 cm 1.85 cm/m  RA Area:     16.10 cm LA Vol (A2C):   57.7 ml 33.28 ml/m RA Volume:   44.00 ml  25.38 ml/m LA Vol (A4C):   37.1 ml 21.40 ml/m LA Biplane Vol: 47.5 ml 27.40 ml/m  AORTIC VALVE AV Area (Vmax):    1.43 cm AV Area (Vmean):   1.54 cm AV Area (VTI):     1.48 cm AV Vmax:           245.00 cm/s AV Vmean:          174.000 cm/s AV VTI:  0.541 m AV Peak Grad:      24.0 mmHg AV Mean Grad:      13.0 mmHg LVOT Vmax:         92.00 cm/s LVOT Vmean:        70.300 cm/s LVOT VTI:          0.210 m LVOT/AV VTI ratio: 0.39  AORTA Ao Root diam: 3.70 cm Ao Asc diam:  3.50 cm MITRAL VALVE MV Area (PHT): 2.80 cm    SHUNTS MV Decel Time: 271 msec    Systemic VTI:  0.21 m MV E velocity: 74.10 cm/s  Systemic Diam: 2.20 cm MV A velocity: 99.00 cm/s MV E/A ratio:  0.75 Lyman Bishop MD Electronically signed by Lyman Bishop MD Signature Date/Time: 02/22/2020/11:58:47 AM    Final     Cardiac Studies   Echocardiogram  02/22/2020:   1. Left ventricular ejection fraction, by estimation, is 60 to 65%. The  left ventricle has normal function. The left ventricle has no regional  wall motion abnormalities. There is mild left ventricular hypertrophy.  Left ventricular diastolic parameters  are consistent with Grade I diastolic dysfunction (impaired relaxation).  2. Right ventricular systolic function is normal. The right ventricular  size is normal. Tricuspid regurgitation signal is inadequate for assessing  PA pressure.  3. The mitral valve is grossly normal. Trivial mitral valve  regurgitation.  4. The aortic valve is calcified. Aortic valve regurgitation is not  visualized. Moderate aortic valve stenosis. Aortic valve area, by VTI  measures 1.48 cm. Aortic valve mean gradient measures 13.0 mmHg. Aortic  valve Vmax measures 2.45 m/s. DI is 0.39.  5. The inferior vena cava is normal in size with greater than 50%  respiratory variability, suggesting right atrial pressure of 3 mmHg.  Patient Profile     69 y.o. male with non-ST elevation myocardial infarction, coronary artery disease now severe requiring emergent CABG with critical left main stenosis, prior RCA stents in 2003 with prior history of follicular lymphoma and moderate aortic stenosis.  Status post aortic valve replacement.  Assessment & Plan    CAD/CABG/aortic valve replacement/non-ST elevation myocardial infarction -Moderate aortic valve stenosis upon calculations of valve area on recent echocardiogram.  Aortic valve replacement with 25 mm Edwards Inspiris Resilia Stented Bovine Pericardial Tissue Valve. -Bypass grafting x2 with LIMA to LAD and SVG to OM. -Continue with aggressive secondary risk factor prevention, high intensity statin atorvastatin 80 mg, beta-blocker as tolerated.  Normal overall ejection fraction. -On aspirin and Plavix given non-ST elevation myocardial infarction. -Cardiac rehab when able.  Moderate aortic  stenosis -Status post aortic valve replacement during CABG for critical left main stenosis.  Follicular lymphoma -In remission since 2016.  Currently not on any chemotherapeutic agents  Amaurosis fugax/TIA history -Had been taking Plavix for an episode of prior amaurosis fugax.  Brain MRI showed remote stroke.  Nothing acute.       For questions or updates, please contact [place] Please consult [email or web address] for contact info under        Signed, Candee Furbish, MD  02/23/2020, 7:21 AM

## 2020-02-23 NOTE — Progress Notes (Signed)
CT surgery p.m. Rounds  Patient with stable day, up out of bed to chair Hemodynamic stable Persistent serosanguineous chest tube output but p.m. labs show stable hemoglobin 11.4 We will give Lasix 20 mg IV this p.m. for volume overload with stable blood pressure  Blood pressure 138/80, pulse 80, temperature 98.2 F (36.8 C), resp. rate (!) 24, height 5\' 3"  (1.6 m), weight 78.5 kg, SpO2 93 %.

## 2020-02-23 NOTE — Progress Notes (Signed)
1fr sheath aspirated and removed from right radial artery. Tr band applied at 11cc. No s+s of hematoma. Slight oozing present from suture site.   Arm raised on blankets a pillow to be elevated.   SPO2 95% as measured in right hand.  Bedrest begins at 07:55:00

## 2020-02-23 NOTE — Progress Notes (Signed)
Started initial wean protocol with patient 40% and rate of 4.

## 2020-02-23 NOTE — Addendum Note (Signed)
Addendum  created 02/23/20 0837 by Roberts Gaudy, MD   Clinical Note Signed

## 2020-02-23 NOTE — Hospital Course (Addendum)
HPI: Patient is 69 year old male with known history of aortic stenosis and coronary artery disease, amaurosis fugax on dual antiplatelet therapy, hypertension, hyperlipidemia, and follicular lymphoma in remission who was admitted to the hospital with acute coronary syndrome, has ruled in for acute non-ST segment elevation myocardial infarction, and been referred for emergent surgical consultation following diagnostic cardiac catheterization which revealed critical left main coronary artery disease.   Patient's cardiac history dates back approximately 20 years ago when he presented with symptoms of angina and was found to have single-vessel coronary artery disease.  He was treated with PCI and stenting of the right coronary artery.  He has done well from a cardiac standpoint until recently.  He developed follicular lymphoma and at that time was noted to have a heart murmur on exam.  Echocardiograms have reportedly revealed normal left ventricular function with mild to moderate aortic stenosis.  The patient states that approximately 1 year ago he had an episode of amaurosis fugax involving the left eye.  He was started on dual antiplatelet therapy using aspirin and Plavix at that time.  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   Approximately 2 to 3 months ago the patient began to experience symptoms of exertional chest pain with shortness of breath.  Symptoms have accelerated and become associated with less physical activity.  He reportedly had a recent stress test that was low risk.  Yesterday morning he developed prolonged episode of chest pain at rest which took more than 30 minutes to resolve.  He presented to the emergency department where EKG revealed anterolateral ST depression.  Cardiac enzymes were weakly positive.  Transthoracic echocardiogram revealed normal left ventricular function with moderate aortic  stenosis.  The patient was brought to the Cath Lab this afternoon for elective diagnostic catheterization by Dr. Tamala Julian.  Catheterization revealed critical left main coronary artery stenosis.  The stents in the right coronary artery remain widely patent.  Right heart catheterization was not performed.  During catheterization the patient developed substernal chest pain at rest which resolved with intravenous nitroglycerin.  Emergent surgical consultation was requested.   Dr. Roxy Manns discussed the need for emergent coronary artery bypass grafting surgery and probable aortic valve replacement surgery. Potential risks, complications, and benefits of the surgery were discussed with the patient and he agreed to proceed with surgery. He underwent an emergent CABG x 2 and AVR (using a bioprosthetic valve) on 02/22/2020.

## 2020-02-23 NOTE — Addendum Note (Signed)
Addendum  created 02/23/20 0741 by Josephine Igo, CRNA   Order list changed

## 2020-02-24 ENCOUNTER — Inpatient Hospital Stay (HOSPITAL_COMMUNITY): Payer: No Typology Code available for payment source

## 2020-02-24 ENCOUNTER — Other Ambulatory Visit: Payer: Self-pay | Admitting: Physician Assistant

## 2020-02-24 DIAGNOSIS — Z952 Presence of prosthetic heart valve: Secondary | ICD-10-CM

## 2020-02-24 LAB — GLUCOSE, CAPILLARY
Glucose-Capillary: 138 mg/dL — ABNORMAL HIGH (ref 70–99)
Glucose-Capillary: 153 mg/dL — ABNORMAL HIGH (ref 70–99)

## 2020-02-24 LAB — CBC
HCT: 31.6 % — ABNORMAL LOW (ref 39.0–52.0)
Hemoglobin: 10.7 g/dL — ABNORMAL LOW (ref 13.0–17.0)
MCH: 31.2 pg (ref 26.0–34.0)
MCHC: 33.9 g/dL (ref 30.0–36.0)
MCV: 92.1 fL (ref 80.0–100.0)
Platelets: 128 10*3/uL — ABNORMAL LOW (ref 150–400)
RBC: 3.43 MIL/uL — ABNORMAL LOW (ref 4.22–5.81)
RDW: 14.5 % (ref 11.5–15.5)
WBC: 18.6 10*3/uL — ABNORMAL HIGH (ref 4.0–10.5)
nRBC: 0 % (ref 0.0–0.2)

## 2020-02-24 LAB — BASIC METABOLIC PANEL
Anion gap: 10 (ref 5–15)
BUN: 22 mg/dL (ref 8–23)
CO2: 22 mmol/L (ref 22–32)
Calcium: 8.1 mg/dL — ABNORMAL LOW (ref 8.9–10.3)
Chloride: 105 mmol/L (ref 98–111)
Creatinine, Ser: 1.15 mg/dL (ref 0.61–1.24)
GFR calc Af Amer: >60 mL/min (ref >60)
GFR calc non Af Amer: >60 mL/min (ref >60)
Glucose, Bld: 155 mg/dL — ABNORMAL HIGH (ref 70–99)
Potassium: 4.4 mmol/L (ref 3.5–5.1)
Sodium: 137 mmol/L (ref 135–145)

## 2020-02-24 LAB — SURGICAL PATHOLOGY

## 2020-02-24 IMAGING — DX DG CHEST 1V PORT
1 series · 1 of 1 positions shown · non-contrast
Comparison: [DATE]

CLINICAL DATA: Status post cardiac surgery

EXAM:
PORTABLE CHEST 1 VIEW

[chest ap]
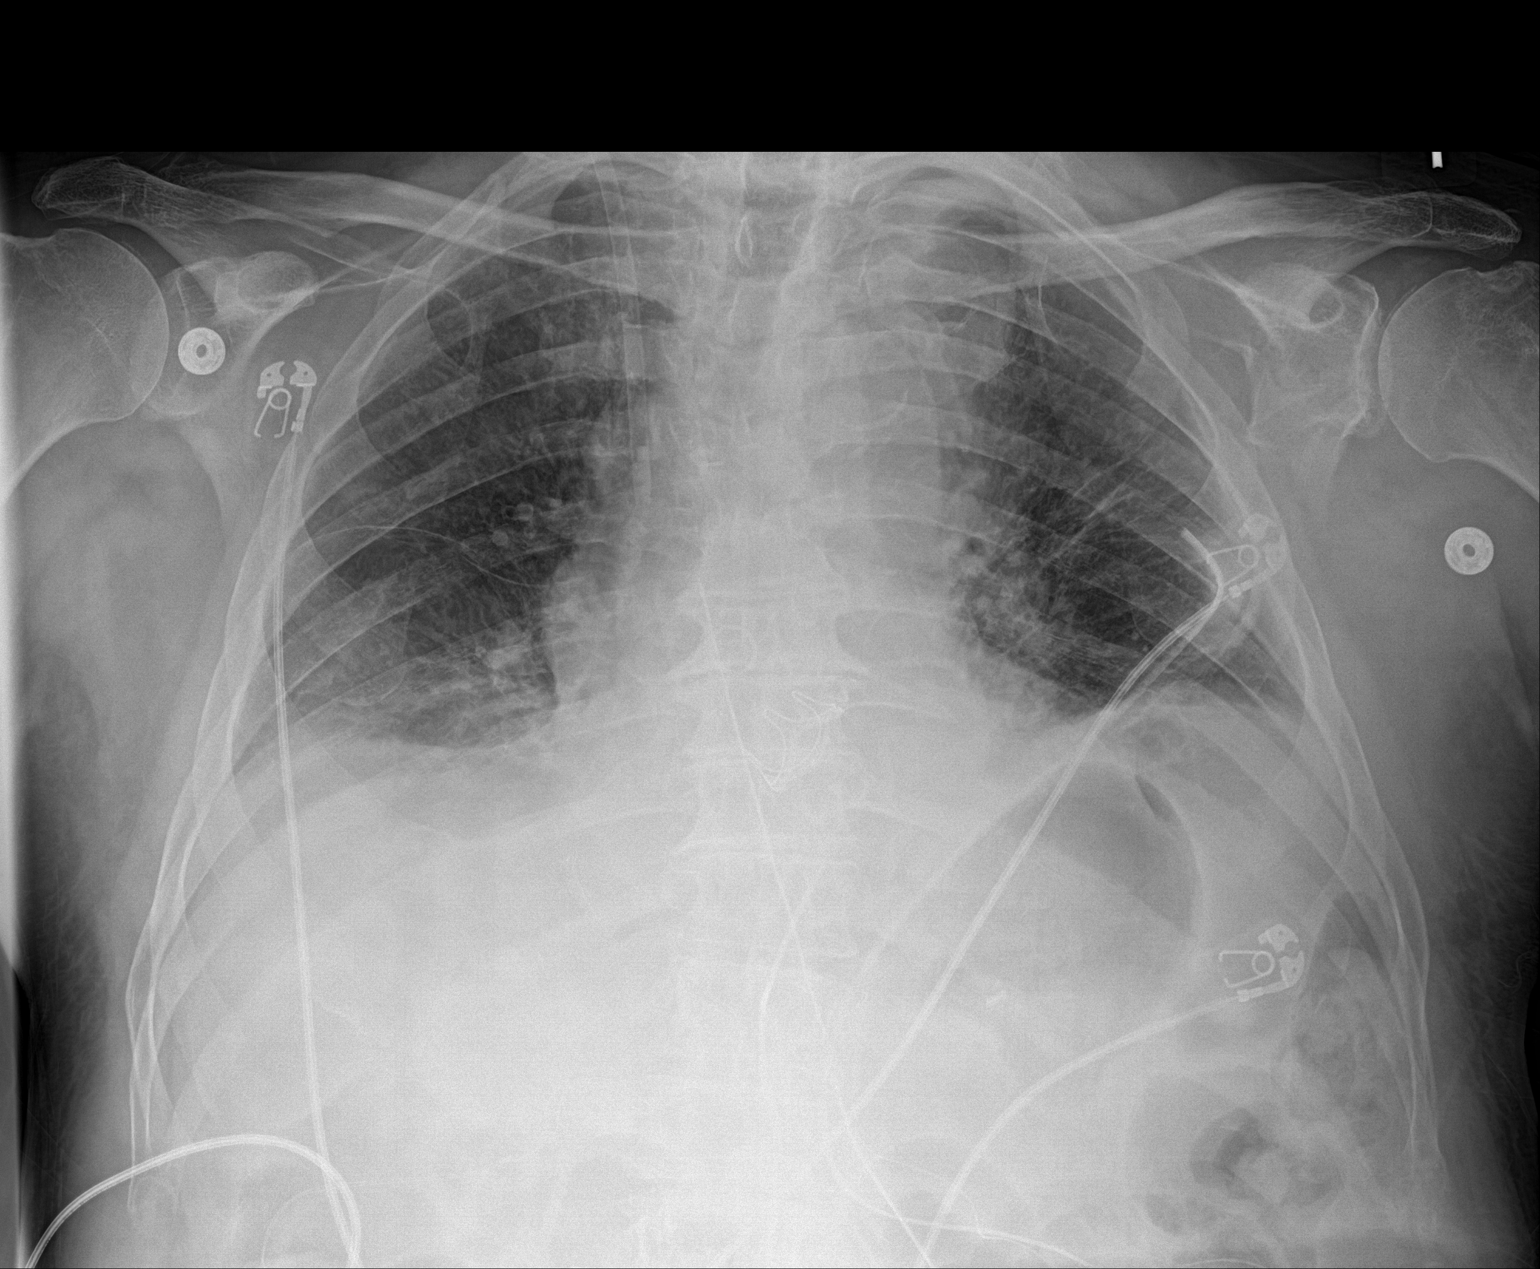

[1 of 1 positions shown; findings below may reference images not displayed]

FINDINGS: Swan-Ganz catheter has been removed in the interval. Mediastinal
drain and pericardial drain are again seen and stable. Right jugular
sheath remains in place. Lungs are well aerated bilaterally. Mild
bibasilar atelectatic changes are seen. Left-sided chest tube is
again noted and stable. No pneumothorax is seen. No bony abnormality
is noted.
IMPRESSION: Tubes and lines as described above.

Mild bibasilar atelectasis.

## 2020-02-24 MED ORDER — CARVEDILOL 3.125 MG PO TABS
3.1250 mg | ORAL_TABLET | Freq: Two times a day (BID) | ORAL | Status: DC
  Administered 2020-02-24 – 2020-02-26 (×5): 3.125 mg via ORAL
  Filled 2020-02-24 (×5): qty 1

## 2020-02-24 MED ORDER — FUROSEMIDE 10 MG/ML IJ SOLN: 20.0000 mg | Freq: Two times a day (BID) | INTRAMUSCULAR | Status: DC

## 2020-02-24 MED ORDER — FUROSEMIDE 10 MG/ML IJ SOLN
40.0000 mg | Freq: Two times a day (BID) | INTRAMUSCULAR | Status: AC
  Administered 2020-02-24 – 2020-02-25 (×3): 40 mg via INTRAVENOUS
  Filled 2020-02-24 (×3): qty 4

## 2020-02-24 MED ORDER — ~~LOC~~ CARDIAC SURGERY, PATIENT & FAMILY EDUCATION: Freq: Once | Status: AC

## 2020-02-24 MED ORDER — INFLUENZA VAC A&B SA ADJ QUAD 0.5 ML IM PRSY
0.5000 mL | PREFILLED_SYRINGE | INTRAMUSCULAR | Status: AC
  Administered 2020-02-26: 0.5 mL via INTRAMUSCULAR
  Filled 2020-02-24: qty 0.5

## 2020-02-24 NOTE — Discharge Instructions (Signed)
[facility] office [phone]    Surgical Aortic Valve Replacement, Care After This sheet gives you information about how to care for yourself after your procedure. Your health care provider may also give you more specific instructions. If you have problems or questions, contact your health care provider. What can I expect after the procedure? After the procedure, it is common to have pain around your incision area. Follow these instructions at home: Medicines  Take over-the-counter and prescription medicines only as told by your health care provider.  If you were prescribed an antibiotic medicine, take it as told by your health care provider. Do not stop taking the antibiotic even if you start to feel better.  If you have a mechanical prosthesis, you may be given a blood thinner called warfarin. Follow instructions carefully on how to take this medicine.  Ask your health care provider if the medicine prescribed to you: ? Requires you to avoid driving or using heavy machinery. ? Can cause constipation. You may need to take actions to prevent or treat constipation, such as:  Take over-the-counter or prescription medicines.  Eat foods that are high in fiber, such as beans, whole grains, and fresh fruits and vegetables.  Limit foods that are high in fat and processed sugars, such as fried or sweet foods. Eating and drinking      Limit how much caffeine you drink. Caffeine can affect your heart's rate and rhythm.  Do not drink alcohol if: ? Your health care provider tells you not to drink. ? You are pregnant, may be pregnant, or are planning to become pregnant.  Drink enough fluid to keep your urine pale yellow.  Eat a heart-healthy diet that includes fruits, vegetables, whole grains, low-fat dairy products, and lean proteins like poultry and eggs. Incision care   Follow instructions from your health care provider about how to take care of your incision. Make sure you: ? Wash your  hands with soap and water before and after you change your bandage (dressing). If soap and water are not available, use hand sanitizer. ? Change your dressing as told by your health care provider. ? Leave stitches (sutures), skin glue, or adhesive strips in place. These skin closures may need to stay in place for 2 weeks or longer. If adhesive strip edges start to loosen and curl up, you may trim the loose edges. Do not remove adhesive strips completely unless your health care provider tells you to do that.  Check your incision area every day for signs of infection. Check for: ? Redness, swelling, or increasing pain. ? Fluid or blood. ? Warmth. ? Pus or a bad smell. Activity  Return to your normal activities as told by your health care provider. Most patients will need to limit any lifting or strenuous activity for 4-6 weeks.  Avoid sitting for a long time without moving. Get up to take short walks every 1-2 hours.  Do exercises as told by your health care provider.  Do not lift anything that is heavier than 10 lb (4.5 kg), or the limit that you are told, until your health care provider says that it is safe.  Avoid pushing or pulling things with your arms until your health care provider approves. This includes pulling on handrails to help you climb stairs. Lifestyle  Do not use any products that contain nicotine or tobacco, such as cigarettes, e-cigarettes, and chewing tobacco. These can delay incision healing after surgery. If you need help quitting, ask your health care  provider.  Resume sexual activity as told by your health care provider. If you have erectile dysfunction, do not use medicines to treat this condition unless your health care provider approves.  Work with your health care provider to: ? Keep your blood pressure and cholesterol under control. ? Manage any other heart conditions that you have. ? Maintain a healthy weight. Driving and travel  Do not drive until your  health care provider approves. Ask your health care provider when it is safe for you to drive.  Avoid airplane travel for as long as told by your health care provider.  When you travel, bring a list of your medicines and a record of your medical history with you. Carry your medicines with you. General instructions  Do not take baths, swim, or use a hot tub until your health care provider approves. You may shower.  Do not strain to have a bowel movement.  Avoid crossing your legs while sitting down.  Check your temperature every day for a fever. A fever may be a sign of infection.  If you are a woman and you plan to become pregnant, talk with your health care provider before you become pregnant.  Wear compression stockings as told by your health care provider. These stockings help to prevent blood clots and reduce swelling in your legs.  Tell all health care providers who care for you that you have an artificial (prosthetic) aortic valve. Also, tell them if you have or have had heart disease or endocarditis.  You will be given a card at discharge. The card indicates the type of prosthetic valve that you have. Keep this card in your wallet or purse for quick reference in case of an emergency.  Keep all follow-up visits as told by your health care provider. This is important. Contact a health care provider if:  You develop a skin rash.  You experience sudden, unexplained changes in your weight.  You have redness, swelling, or increasing pain around your incision.  You have fluid or blood coming from your incision.  Your incision feels warm to the touch.  You have pus or a bad smell coming from your incision.  You have a fever. Get help right away if you:  Develop chest pain that is different from the pain coming from your incision.  Develop shortness of breath or difficulty breathing.  Start to feel light-headed. These symptoms may represent a serious problem that is an  emergency. Do not wait to see if the symptoms will go away. Get medical help right away. Call your local emergency services (911 in the [place] Do not drive yourself to the hospital. Summary  After this procedure, it is common to have pain in the incision area.  Eat a heart-healthy diet. Follow instructions about alcohol use.  Get up to walk often. Avoid pushing and pulling with your arms. Ask what activities are safe for you.  Care for your incision as told by your health care provider. Check it daily for signs of infection.  Get help right away if you have chest pain that is different from your incisions, develop shortness of breath or difficulty breathing, or start to feel light-headed. This information is not intended to replace advice given to you by your health care provider. Make sure you discuss any questions you have with your health care provider. Document Revised: 02/20/2018 Document Reviewed: 02/20/2018 Elsevier Patient Education  [place]. Endoscopic Saphenous Vein Harvesting, Care After This sheet gives you information  about how to care for yourself after your procedure. Your health care provider may also give you more specific instructions. If you have problems or questions, contact your health care provider. What can I expect after the procedure? After the procedure, it is common to have:  Pain.  Bruising.  Swelling.  Numbness. Follow these instructions at home: Incision care   Follow instructions from your health care provider about how to take care of your incisions. Make sure you: ? Wash your hands with soap and water before and after you change your bandages (dressings). If soap and water are not available, use hand sanitizer. ? Change your dressings as told by your health care provider. ? Leave stitches (sutures), skin glue, or adhesive strips in place. These skin closures may need to stay in place for 2 weeks or longer. If adhesive strip edges start to  loosen and curl up, you may trim the loose edges. Do not remove adhesive strips completely unless your health care provider tells you to do that.  Check your incision areas every day for signs of infection. Check for: ? More redness, swelling, or pain. ? Fluid or blood. ? Warmth. ? Pus or a bad smell. Medicines  Take over-the-counter and prescription medicines only as told by your health care provider.  Ask your health care provider if the medicine prescribed to you requires you to avoid driving or using heavy machinery. General instructions  Raise (elevate) your legs above the level of your heart while you are sitting or lying down.  Avoid crossing your legs.  Avoid sitting for long periods of time. Change positions every 30 minutes.  Do any exercises your health care providers have given you. These may include deep breathing, coughing, and walking exercises.  Do not take baths, swim, or use a hot tub until your health care provider approves. Ask your health care provider if you may take showers. You may only be allowed to take sponge baths.  Wear compression stockings as told by your health care provider. These stockings help to prevent blood clots and reduce swelling in your legs.  Keep all follow-up visits as told by your health care provider. This is important. Contact a health care provider if:  Medicine does not help your pain.  Your pain gets worse.  You have new leg bruises or your leg bruises get bigger.  Your leg feels numb.  You have more redness, swelling, or pain around your incision.  You have fluid or blood coming from your incision.  Your incision feels warm to the touch.  You have pus or a bad smell coming from your incision.  You have a fever. Get help right away if:  Your pain is severe.  You develop pain, tenderness, warmth, redness, or swelling in any part of your leg.  You have chest pain.  You have trouble breathing. Summary  Raise  (elevate) your legs above the level of your heart while you are sitting or lying down.  Wear compression stockings as told by your health care provider.  Make sure you know which symptoms should prompt you to contact your health care provider.  Keep all follow-up visits as told by your health care provider. This information is not intended to replace advice given to you by your health care provider. Make sure you discuss any questions you have with your health care provider. Document Revised: 05/05/2018 Document Reviewed: 05/05/2018 Elsevier Patient Education  [place]. Coronary Artery Bypass Grafting, Care After This  sheet gives you information about how to care for yourself after your procedure. Your doctor may also give you more specific instructions. If you have problems or questions, call your doctor. What can I expect after the procedure? After the procedure, it is common to:  Feel sick to your stomach (nauseous).  Not want to eat as much as normal (lack of appetite).  Have trouble pooping (constipation).  Have weakness and tiredness (fatigue).  Feel sad (depressed) or grouchy (irritable).  Have pain or discomfort around the cuts from surgery (incisions). Follow these instructions at home: Medicines  Take over-the-counter and prescription medicines only as told by your doctor. Do not stop taking medicines or start any new medicines unless your doctor says it is okay.  If you were prescribed an antibiotic medicine, take it as told by your doctor. Do not stop taking the antibiotic even if you start to feel better. Incision care   Follow instructions from your doctor about how to take care of your cuts from surgery. Make sure you: ? Wash your hands with soap and water before and after you change your bandage (dressing). If you cannot use soap and water, use hand sanitizer. ? Change your bandage as told by your doctor. ? Leave stitches (sutures), skin glue, or skin  tape (adhesive) strips in place. They may need to stay in place for 2 weeks or longer. If tape strips get loose and curl up, you may trim the loose edges. Do not remove tape strips completely unless your doctor says it is okay.  Make sure the surgery cuts are clean, dry, and protected.  Check your cut areas every day for signs of infection. Check for: ? More redness, swelling, or pain. ? More fluid or blood. ? Warmth. ? Pus or a bad smell.  If cuts were made in your legs: ? Avoid crossing your legs. ? Avoid sitting for long periods of time. Change positions every 30 minutes. ? Raise (elevate) your legs when you are sitting. Bathing  Do not take baths, swim, or use a hot tub until your doctor says it is okay.  Pat the surgery cuts dry. Do not rub the cuts to dry.  You may shower Eating and drinking   Eat foods that are high in fiber, such as beans, nuts, whole grains, and raw fruits and vegetables. Any meats you eat should be lean cut. Avoid canned, processed, and fried foods. This can help prevent trouble pooping. This is also a part of a heart-healthy diet.  Drink enough fluid to keep your pee (urine) pale yellow.  Do not drink alcohol until you are fully recovered. Ask your doctor when it is safe to drink alcohol. Activity  Rest and limit your activity as told by your doctor. You may be told to: ? Stop any activity right away if you have chest pain, shortness of breath, irregular heartbeats, or dizziness. Get help right away if you have any of these symptoms. ? Move around often for short periods or take short walks as told by your doctor. Slowly increase your activities. ? Avoid lifting, pushing, or pulling anything that is heavier than 10 lb (4.5 kg) for at least 6 weeks or as told by your doctor.  Do physical therapy or a cardiac rehab (cardiac rehabilitation) program as told by your doctor. ? Physical therapy involves doing exercises to maintain movement and build strength  and endurance. ? A cardiac rehab program includes:  Exercise training.  Education.  Counseling.  Do not drive until your doctor says it is okay.  Ask your doctor when you can go back to work.  Ask your doctor when you can be sexually active. General instructions  Do not drive or use heavy machinery while taking prescription pain medicine.  Do not use any products that contain nicotine or tobacco. These include cigarettes, e-cigarettes, and chewing tobacco. If you need help quitting, ask your doctor.  Take 2-3 deep breaths every few hours during the day while you get better. This helps expand your lungs and prevent problems.  If you were given a device called an incentive spirometer, use it several times a day to practice deep breathing. Support your chest with a pillow or your arms when you take deep breaths or cough.  Wear compression stockings as told by your doctor.  Weigh yourself every day. This helps to see if your body is holding (retaining) fluid that may make your heart and lungs work harder.  Keep all follow-up visits as told by your doctor. This is important. Contact a doctor if:  You have more redness, swelling, or pain around any cut.  You have more fluid or blood coming from any cut.  Any cut feels warm to the touch.  You have pus or a bad smell coming from any cut.  You have a fever.  You have swelling in your ankles or legs.  You have pain in your legs.  You gain 2 lb (0.9 kg) or more a day.  You feel sick to your stomach or you throw up (vomit).  You have watery poop (diarrhea). Get help right away if:  You have chest pain that goes to your jaw or arms.  You are short of breath.  You have a fast or irregular heartbeat.  You notice a "clicking" in your breastbone (sternum) when you move.  You have any signs of a stroke. "BE FAST" is an easy way to remember the main warning signs: ? B - Balance. Signs are dizziness, sudden trouble walking,  or loss of balance. ? E - Eyes. Signs are trouble seeing or a change in how you see. ? F - Face. Signs are sudden weakness or loss of feeling of the face, or the face or eyelid drooping on one side. ? A - Arms. Signs are weakness or loss of feeling in an arm. This happens suddenly and usually on one side of the body. ? S - Speech. Signs are sudden trouble speaking, slurred speech, or trouble understanding what people say. ? T - Time. Time to call emergency services. Write down what time symptoms started.  You have other signs of a stroke, such as: ? A sudden, very bad headache with no known cause. ? Feeling sick to your stomach. ? Throwing up. ? Jerky movements you cannot control (seizure). These symptoms may be an emergency. Do not wait to see if the symptoms will go away. Get medical help right away. Call your local emergency services (911 in the [place] Do not drive yourself to the hospital. Summary  After the procedure, it is common to have pain or discomfort in the cuts from surgery (incisions).  Do not take baths, swim, or use a hot tub until your doctor says it is okay.  Slowly increase your activities. You may need physical therapy or cardiac rehab.  Weigh yourself every day. This helps to see if your body is holding fluid. This information is not intended to replace advice given to you by  your health care provider. Make sure you discuss any questions you have with your health care provider. Document Revised: 02/04/2018 Document Reviewed: 02/04/2018 Elsevier Patient Education  2020 [employer].

## 2020-02-24 NOTE — Progress Notes (Signed)
Checked on pt earlier but he had just ambulated. Now just ambulated for third walk with MS. Will f/u tomorrow. Yves Dill CES, ACSM 2:40 PM 02/24/2020

## 2020-02-24 NOTE — Discharge Summary (Signed)
Physician Discharge Summary  Patient ID: Alan Mcdonald MRN: [id number] DOB/AGE: 1951-01-06 69 y.o.  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  Reason for consultation:  Critical left main coronary artery disease  HPI: at time of consultation  Patient is 69 year old male with known history of aortic stenosis and coronary artery disease, amaurosis fugax on dual antiplatelet therapy, hypertension, hyperlipidemia, and follicular lymphoma in remission who was admitted to the hospital with acute coronary syndrome, has ruled in for acute non-ST segment elevation myocardial infarction, and been referred for emergent surgical consultation following diagnostic cardiac catheterization which revealed critical left main coronary artery disease.  Patient's cardiac history dates back approximately 20 years ago when he presented with symptoms of angina and was found to have single-vessel coronary artery disease.  He was treated with PCI and stenting of the right coronary artery.  He has done well from a cardiac standpoint until recently.  He developed follicular lymphoma and at that time was  noted to have a heart murmur on exam.  Echocardiograms have reportedly revealed normal left ventricular function with mild to moderate aortic stenosis.  The patient states that approximately 1 year ago he had an episode of amaurosis fugax involving the left eye.  He was started on dual antiplatelet therapy using aspirin and Plavix at that time.  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  Approximately 2 to 3 months ago the patient began to experience symptoms of exertional chest pain with shortness of breath.  Symptoms have accelerated and become associated with less physical activity.  He reportedly had a recent stress test that was low risk.  Yesterday morning he developed prolonged episode of chest pain at rest which took more than 30 minutes to resolve.  He presented to the emergency department where EKG revealed anterolateral ST depression.  Cardiac enzymes were weakly positive.  Transthoracic echocardiogram revealed normal left ventricular function with moderate aortic stenosis.  The patient was brought to the Cath Lab this afternoon for elective diagnostic catheterization by Dr. Tamala Julian.  Catheterization revealed critical left main coronary artery stenosis.  The stents in the right coronary artery remain widely patent.  Right heart catheterization was not performed.  During catheterization the patient developed substernal chest pain at rest which resolved with intravenous nitroglycerin.  Emergent surgical consultation was requested.  Patient is married and lives locally in [place], [place] with his wife.  He states that up until recently he has remained reasonably active physically for his age and without significant physical limitations.  At present he denies ongoing chest pain or shortness of  breath.  He has not had any recent treatment visual disturbances.  He denies resting shortness of breath, PND, or  orthopnea, or lower extremity edema.  He has had accelerating symptoms of exertional chest pain for at least 2 months.  He is fully vaccinated against the COVID-19 vaccine.  The patient and his studies were evaluated by Dr. Roxy Manns who felt he should proceed emergently with surgical intervention.  Discharged Condition: good  Hospital Course: The patient has done well.  He was extubated without difficulty using standard post cardiac surgical protocols.  He has remained hemodynamically stable not requiring inotropes or vasopressors.  All routine lines, monitors and drainage devices have been discontinued in the standard fashion.  He has had some postoperative hypertension but has been improving with initiation of beta-blocker as well as resuming his preoperative ARB.  Oxygen has been weaned and he maintains good saturations on room air.  He does have a mild expected acute blood loss anemia which is stable.  Most recent hemoglobin hematocrit dated 02/25/2020 is 8.8 and 26.0 respectively.  Renal function has been within normal limits he did have some mild volume overload but has diuresed well.  He has had some postoperative nausea and poor appetite but this has shown a steady improvement over time.  Chest x-ray is stable in appearance with clear airspace and no significant effusions.  Blood sugars have been under good control with routine protocols and he has no history of diabetes and was not on preoperative medications.  He is on dual antiplatelet therapy with aspirin and Plavix as well as high intensity statin therapy.  Incisions are noted to be healing well without evidence of infection.  He is overall felt to be quite stable for discharge at this time.  Consults: cardiology  Significant Diagnostic Studies: angiography:  LEFT HEART CATH AND CORONARY ANGIOGRAPHY  Conclusion   Severe distal left main disease  Proximal LAD 60-70% and 80% mid before large diagonal.  95% ostial circumflex stenosis and 70%  dominant obtuse marginal proximal stenosis.  Diffuse proximal distal 60% stenosis some of which is diffuse in-stent.  Faint septal perforator collaterals emanate from the PDA towards the LAD which is not opacified.  LV function is normal.  LVEDP is 8 mmHg.  Heavily calcified aortic valve with 15 mm mean gradient on pullback.  RECOMMENDATIONS:   He developed prolonged chest pain immediately post cath.  IV fluid bolus was administered, IV nitroglycerin started, and 50 mcg of fentanyl were given.  The pain gradually resolved over 15 to 20 minutes.  In lab consult with Dr. Roxy Manns we decided to do urgent CABG on the patient.  Initial plan to place intra-aortic balloon pump was decided against after decision for urgent surgery. Surgeon Notes    02/22/2020 10:12 PM Op Note signed by Rexene Alberts, MD    02/22/2020 10:10 PM Operative Note - Scan signed by Default, Provider, MD    02/22/2020 9:48 PM Brief Op Note addendum by Rexene Alberts, MD  Indications  Coronary artery disease involving native coronary artery of native heart with unstable angina pectoris ([place]) [I25.110 (ICD-10-CM)]  Procedural Details  Technical Details The right radial area was sterilely prepped and draped. Intravenous sedation with Versed and fentanyl was administered. 1% Xylocaine was infiltrated to achieve local analgesia. Using real-time vascular ultrasound, a double wall stick with an angiocath was utilized to obtain intra-arterial access. A VUS image was saved for the record.The modified Seldinger technique was used to place a 58F "  Slender" sheath in the right radial artery. Weight based heparin was administered. Coronary angiography was done using 5 F catheters. Right coronary angiography was performed with a JR4. Left ventricular hemodymic recordings and angiography was done using the JR 4 catheter and hand injection. Left coronary angiography was performed with a JL 3.5 cm.  Hemostasis was achieved using a  pneumatic band.  During this procedure the patient is administered a total of Versed 1 mg and Fentanyl 100 mcg to achieve and maintain moderate conscious sedation.  The patient's heart rate, blood pressure, and oxygen saturation are monitored continuously during the procedure. The period of conscious sedation is 45 minutes, of which I was present face-to-face 100% of this time. Estimated blood loss <50 mL.   During this procedure medications were administered to achieve and maintain moderate conscious sedation while the patient's heart rate, blood pressure, and oxygen saturation were continuously monitored and I was present face-to-face 100% of this time.  Medications (Filter: Administrations occurring from 1408 to 1605 on 02/22/20) (important) Continuous medications are totaled by the amount administered until 02/22/20 1605.  midazolam (VERSED) injection (mg) Total dose:  1 mg  Date/Time  Rate/Dose/Volume Action  02/22/20 1438  1 mg Given    fentaNYL (SUBLIMAZE) injection (mcg) Total dose:  100 mcg  Date/Time  Rate/Dose/Volume Action  02/22/20 1438  50 mcg Given  1507  50 mcg Given    lidocaine (PF) (XYLOCAINE) 1 % injection (mL) Total volume:  2 mL  Date/Time  Rate/Dose/Volume Action  02/22/20 1440  2 mL Given    Radial Cocktail/Verapamil only (mL) Total volume:  10 mL  Date/Time  Rate/Dose/Volume Action  02/22/20 1440  10 mL Given    heparin sodium (porcine) injection (Units) Total dose:  4,000 Units  Date/Time  Rate/Dose/Volume Action  02/22/20 1441  4,000 Units Given    Heparin (Porcine) in NaCl 1000-0.9 UT/500ML-% SOLN (mL) Total volume:  1,000 mL  Date/Time  Rate/Dose/Volume Action  02/22/20 1442  500 mL Given  1442  500 mL Given    iohexol (OMNIPAQUE) 350 MG/ML injection (mL) Total volume:  50 mL  Date/Time  Rate/Dose/Volume Action  02/22/20 1455  50 mL Given    0.9 % sodium chloride infusion (mL) Total dose:  Cannot be calculated*  *Continuous  medication not stopped within the calculation time range. Date/Time  Rate/Dose/Volume Action  02/22/20 1501  300 mL - 999 mL/hr New Bag/Given    nitroGLYCERIN 50 mg in dextrose 5 % 250 mL (0.2 mg/mL) infusion (mcg/min) Total dose:  Cannot be calculated* Dosing weight:  70.2  *Continuous medication not stopped within the calculation time range. Date/Time  Rate/Dose/Volume Action  02/22/20 1502  20 mcg/min - 6 mL/hr New Bag/Given    nitroGLYCERIN (NITROSTAT) SL tablet (mg) Total dose:  0.4 mg  Date/Time  Rate/Dose/Volume Action  02/22/20 1500  0.4 mg Given    nitroGLYCERIN 50 mg in dextrose 5 % 250 mL (0.2 mg/mL) infusion (mcg/min) Total dose:  Cannot be calculated* Dosing weight:  70.2  *Continuous medication not stopped within the calculation time range. Date/Time  Rate/Dose/Volume Action  02/22/20 1605  20 mcg/min - 6 mL/hr New Bag/Given    acetaminophen (TYLENOL) tablet 650 mg (mg) Total dose:  Cannot be calculated* Dosing weight:  72.6  *Administration dose not documented Date/Time  Rate/Dose/Volume Action  02/22/20 1408  *Not included in total MAR Hold    aspirin EC tablet 81 mg (mg) Total dose:  Cannot be calculated* Dosing weight:  72.6  *  Administration dose not documented Date/Time  Rate/Dose/Volume Action  02/22/20 1408  *Not included in total MAR Hold    atorvastatin (LIPITOR) tablet 80 mg (mg) Total dose:  Cannot be calculated* Dosing weight:  72.6  *Administration dose not documented Date/Time  Rate/Dose/Volume Action  02/22/20 1408  *Not included in total MAR Hold    carvedilol (COREG) tablet 6.25 mg (mg) Total dose:  Cannot be calculated* Dosing weight:  72.6  *Administration dose not documented Date/Time  Rate/Dose/Volume Action  02/22/20 1408  *Not included in total MAR Hold    losartan (COZAAR) tablet 100 mg (mg) Total dose:  Cannot be calculated*  *Administration dose not documented Date/Time  Rate/Dose/Volume Action  02/22/20 1408  *Not  included in total MAR Hold    nitroGLYCERIN (NITROSTAT) SL tablet 0.4 mg (mg) Total dose:  Cannot be calculated* Dosing weight:  72.6  *Administration dose not documented Date/Time  Rate/Dose/Volume Action  02/22/20 1408  *Not included in total MAR Hold    ondansetron (ZOFRAN) injection 4 mg (mg) Total dose:  Cannot be calculated* Dosing weight:  72.6  *Administration dose not documented Date/Time  Rate/Dose/Volume Action  02/22/20 1408  *Not included in total MAR Hold    sodium chloride flush (NS) 0.9 % injection 3 mL (mL) Total dose:  Cannot be calculated* Dosing weight:  70.2  *Administration dose not documented Date/Time  Rate/Dose/Volume Action  02/22/20 1408  *Not included in total MAR Hold    Sedation Time  Sedation Time Physician-1: 55 minutes 17 seconds  Contrast  Medication Name Total Dose  iohexol (OMNIPAQUE) 350 MG/ML injection 50 mL    Radiation/Fluoro  Fluoro time: 3.6 (min) DAP: 15445 (mGycm2) Cumulative Air Kerma: 270 (mGy)  Coronary Findings  Diagnostic Dominance: Right Left Main  Mid LM to Dist LM lesion is 99% stenosed.  Left Anterior Descending  There is moderate diffuse disease throughout the vessel.  Ost LAD to Prox LAD lesion is 75% stenosed.  Mid LAD lesion is 80% stenosed.  First Diagonal Branch  Vessel is small in size.  Second Septal Branch  Left Circumflex  Vessel is moderate in size.  Ost Cx to Prox Cx lesion is 95% stenosed.  First Obtuse Marginal Branch  The vessel exhibits minimal luminal irregularities.  1st Mrg lesion is 70% stenosed.  Second Obtuse Marginal Branch  Vessel is small in size.  Right Coronary Artery  There is mild diffuse disease throughout the vessel.  Prox RCA lesion is 45% stenosed.  Prox RCA to Mid RCA lesion is 60% stenosed. The lesion was previously treated.  Dist RCA lesion is 50% stenosed.  First Right Posterolateral Branch  Vessel is small in size.  Second Right Posterolateral Branch  Vessel is  large in size.  Third Right Posterolateral Branch  Vessel is small in size.  Intervention  No interventions have been documented. Wall Motion  Resting               Left Heart  Left Ventricle The left ventricular size is normal. The left ventricular systolic function is normal. LV end diastolic pressure is normal. The left ventricular ejection fraction is greater than 65% by visual estimate. No regional wall motion abnormalities.  Coronary Diagrams  Diagnostic Dominance: Right   Treatments: surgery:  CARDIOTHORACIC SURGERY OPERATIVE NOTE  Date of Procedure:                02/22/2020  Preoperative Diagnosis:       ? Critical Left Main Coronary Artery Disease ?  S/P Acute Non-ST Segment Elevation Myocardial Infarction ? Moderate Aortic Stenosis  Postoperative Diagnosis:    Same  Procedure:        Emergency Coronary Artery Bypass Grafting x 2             Left Internal Mammary Artery to Distal Left Anterior Descending Coronary Artery             Saphenous Vein Graft to Obtuse Marginal Branch of Left Circumflex Coronary Artery             Endoscopic Vein Harvest from Right Thigh   Aortic Valve Replacement             Edwards Inspiris Resilia Stented Bovine Pericardial Tissue Valve (size 54mm, ref # [id number], serial # [id number])  Surgeon:        Valentina Gu. Roxy Manns, MD  Assistant:       Nani Skillern, PA-C  Anesthesia:    Laurie Panda, MD and Roberts Gaudy, MD  Operative Findings: ? Moderate aortic stenosis ? Normal left ventricular systolic function ? Good quality left internal mammary artery conduit ? Small caliber but otherwise good quality saphenous vein conduit ? Diffusely calcified coronary arteries with fair quality target vessels for grafting Discharge Exam: Blood pressure 139/89, pulse 82, temperature 99.5 F (37.5 C), temperature source Oral, resp. rate 19, height 5\' 3"  (1.6 m), weight 74.7 kg, SpO2 95 %.  General appearance: alert,  cooperative and no distress Heart: regular rate and rhythm Lungs: mildly dim in bases Abdomen: benign Extremities: no edema Wound: incis healing well Disposition: Discharge disposition: 01-Home or Self Care       Discharge Instructions    Discharge patient   Complete by: As directed    Discharge disposition: 01-Home or Self Care   Discharge patient date: 02/26/2020     Allergies as of 02/26/2020   No Known Allergies     Medication List    STOP taking these medications   hydrochlorothiazide 25 MG tablet Commonly known as: HYDRODIURIL   methylPREDNISolone 4 MG Tbpk tablet Commonly known as: Medrol     TAKE these medications   aspirin EC 81 MG tablet Take by mouth.   atorvastatin 80 MG tablet Commonly known as: LIPITOR Take 1 tablet (80 mg total) by mouth every evening. What changed:   medication strength  how much to take   carvedilol 3.125 MG tablet Commonly known as: COREG Take 1 tablet (3.125 mg total) by mouth 2 (two) times daily with a meal.   clopidogrel 75 MG tablet Commonly known as: PLAVIX Take 75 mg by mouth daily.   Co Q-10 200 MG Caps Take 1 tablet by mouth daily.   glucosamine-chondroitin 500-400 MG tablet Take 1 tablet by mouth daily.   losartan 100 MG tablet Commonly known as: COZAAR Take 100 mg by mouth daily.   multivitamin with minerals Tabs tablet Take 1 tablet by mouth daily.   multivitamin-iron-minerals-folic acid Tabs tablet Take 1 tablet by mouth daily.   omeprazole 20 MG capsule Commonly known as: PRILOSEC Take 20 mg by mouth daily.   QC Tumeric Complex 500 MG Caps Generic drug: Turmeric Take 1 tablet by mouth daily.   RA Krill Oil 500 MG Caps Take by mouth.   traMADol 50 MG tablet Commonly known as: ULTRAM Take 1 tablet (50 mg total) by mouth every 6 (six) hours as needed for up to 7 days for moderate pain.   Vitamin B-Complex Tabs Take 1 tablet by mouth daily.  Zinc 30 MG Caps Take 1 capsule by mouth  daily.            Durable Medical Equipment  (From admission, onward)         Start     Ordered   02/26/20 1016  For home use only DME Walker rolling  Once       Question Answer Comment  Walker: With 5 Inch Wheels   Patient needs a walker to treat with the following condition Weakness generalized      02/26/20 1015          Follow-up Information    Belva Crome, MD Follow up.   Specialty: Cardiology Contact information: [address] [address] [phone]        Rexene Alberts, MD Follow up.   Specialty: Cardiothoracic Surgery Contact information: [address] [place] [address] [place], [place] [facility] Follow up.   Why: rolling walker arranged- to be delivered to room prior to transition home Contact information: [address] [place] [address] [phone]              The patient has been discharged on:   1.Beta Blocker:  Yes [ y  ]                              No   [   ]                              If No, reason:  2.Ace Inhibitor/ARB: Yes Blue.Reese   ]                                     No  [    ]                                     If No, reason:  3.Statin:   Yes [  y ]                  No  [   ]                  If No, reason:  4.Ecasa:  Yes  [  y ]                  No   [   ]                  If No, reason: Signed: Gaspar Bidding 02/26/2020, 10:36 AM

## 2020-02-24 NOTE — Progress Notes (Signed)
Patient arrived from [facility] to [phone] after CABGx2 and AVR w/Dr. Roxy Manns.  Telemetry monitor applied and CCMD notified.  CHG bath and skin assessment completed.  Patient oriented to unit and room to include call light and phone.  Will continue to monitor.

## 2020-02-24 NOTE — Progress Notes (Signed)
Mobility Specialist - Progress Note   02/24/20 1434  Mobility  Activity Ambulated in hall  Level of Assistance Minimal assist, patient does 75% or more  Assistive Device Front wheel walker  Distance Ambulated (ft) 250 ft (Intervals: 125 ft x 2)  Mobility Response Tolerated fair  Mobility performed by Mobility specialist  $Mobility charge 1 Mobility    Pre-mobility: 77 HR, 141/77 BP, 93% SpO2 During mobility: 93 HR, 94% SpO2 Post-mobility: 76 HR, 167/81 BP, 97% SpO2  Pt required a standing rest break while ambulating in order to "avoid getting out of breath". Pt's SpO2 remained >90% throughout. He required min assist in order to get into bed while adhering to sternal precautions.   Pricilla Handler Mobility Specialist Mobility Specialist Phone: [phone]

## 2020-02-24 NOTE — Progress Notes (Addendum)
TCTS DAILY ICU PROGRESS NOTE                   [address]            [place] [address]          [phone]   2 Days Post-Op Procedure(s) (LRB): CORONARY ARTERY BYPASS GRAFTING (CABG) USING LIMA to LAD; ENDSCOPICALLY HARVEST RIGHT GREATER SAPHENOUS VEIN: SVG to OM1 (N/A) AORTIC VALVE REPLACEMENT (AVR) USING EDWARDS Resilia 25 MM AORTIC VALVE. (N/A) TRANSESOPHAGEAL ECHOCARDIOGRAM (TEE) (N/A) ENDOVEIN HARVEST OF GREATER SAPHENOUS VEIN (Right)  Total Length of Stay:  LOS: 3 days   Subjective:  conts to do well, for transfer out of ICU today Pain controlled, appetite is poor but overall feels pretty good about recovery thus far  Objective: Vital signs in last 24 hours: Temp:  [97.7 F (36.5 C)-98.3 F (36.8 C)] 98.3 F (36.8 C) (09/15 0818) Pulse Rate:  [67-81] 72 (09/15 0700) Cardiac Rhythm: Normal sinus rhythm (09/15 0425) Resp:  [18-30] 21 (09/15 0700) BP: (116-165)/(72-86) 144/77 (09/15 0700) SpO2:  [91 %-97 %] 92 % (09/15 0700) Weight:  [78.2 kg] 78.2 kg (09/15 0500)  Filed Weights   02/22/20 0423 02/23/20 0419 02/24/20 0500  Weight: 70.2 kg 78.5 kg 78.2 kg    Weight change: -0.3 kg   Hemodynamic parameters for last 24 hours:    Intake/Output from previous day: 09/14 0701 - 09/15 0700 In: 763.3 [I.V.:563.3; IV Piggyback:200] Out: 1890 [Urine:870; Chest Tube:1020]  Intake/Output this shift: Total I/O In: -  Out: 30 [Chest Tube:30]  Current Meds: Scheduled Meds: . acetaminophen  1,000 mg Oral Q6H  . aspirin EC  81 mg Oral Daily  . atorvastatin  80 mg Oral QPM  . bisacodyl  10 mg Oral Daily   Or  . bisacodyl  10 mg Rectal Daily  . carvedilol  3.125 mg Oral BID WC  . Chlorhexidine Gluconate Cloth  6 each Topical Daily  . clopidogrel  75 mg Oral Daily  . [facility], Patient & Family Education   Does not apply Once  . docusate sodium  200 mg Oral Daily  . enoxaparin (LOVENOX) injection  30 mg Subcutaneous QHS  .  furosemide  40 mg Intravenous BID  . pantoprazole  40 mg Oral Daily  . sodium chloride flush  3 mL Intravenous Q12H   Continuous Infusions: . sodium chloride    . cefUROXime (ZINACEF)  IV 1.5 g (02/24/20 0750)  . lactated ringers Stopped (02/23/20 1012)   PRN Meds:.metoprolol tartrate, morphine injection, ondansetron (ZOFRAN) IV, oxyCODONE, sodium chloride flush, traMADol  General appearance: alert, cooperative and no distress Heart: regular rate and rhythm Lungs: dim in lower fields Abdomen: benign Extremities: no edema Wound: dressings in place  Lab Results: CBC: Recent Labs    02/23/20 1640 02/24/20 0415  WBC 17.6* 18.6*  HGB 11.3* 10.7*  HCT 33.3* 31.6*  PLT 122* 128*   BMET:  Recent Labs    02/23/20 1640 02/24/20 0415  NA 139 137  K 3.7 4.4  CL 107 105  CO2 22 22  GLUCOSE 165* 155*  BUN 14 22  CREATININE 1.02 1.15  CALCIUM 8.0* 8.1*    CMET: Lab Results  Component Value Date   WBC 18.6 (H) 02/24/2020   HGB 10.7 (L) 02/24/2020   HCT 31.6 (L) 02/24/2020   PLT 128 (L) 02/24/2020   GLUCOSE 155 (H) 02/24/2020   CHOL 151 02/22/2020   TRIG 152 (H)  02/22/2020   HDL 37 (L) 02/22/2020   LDLCALC 84 02/22/2020   NA 137 02/24/2020   K 4.4 02/24/2020   CL 105 02/24/2020   CREATININE 1.15 02/24/2020   BUN 22 02/24/2020   CO2 22 02/24/2020   INR 1.7 (H) 02/22/2020   HGBA1C 5.3 02/22/2020      PT/INR:  Recent Labs    02/22/20 2240  LABPROT 18.9*  INR 1.7*   Radiology: DG Chest Port 1 View  Result Date: 02/24/2020 CLINICAL DATA:  Status post cardiac surgery EXAM: PORTABLE CHEST 1 VIEW COMPARISON:  02/23/2020 FINDINGS: Swan-Ganz catheter has been removed in the interval. Mediastinal drain and pericardial drain are again seen and stable. Right jugular sheath remains in place. Lungs are well aerated bilaterally. Mild bibasilar atelectatic changes are seen. Left-sided chest tube is again noted and stable. No pneumothorax is seen. No bony abnormality is  noted. IMPRESSION: Tubes and lines as described above. Mild bibasilar atelectasis. Electronically Signed   By: Inez Catalina M.D.   On: 02/24/2020 08:13     Assessment/Plan: S/P Procedure(s) (LRB): CORONARY ARTERY BYPASS GRAFTING (CABG) USING LIMA to LAD; ENDSCOPICALLY HARVEST RIGHT GREATER SAPHENOUS VEIN: SVG to OM1 (N/A) AORTIC VALVE REPLACEMENT (AVR) USING EDWARDS Resilia 25 MM AORTIC VALVE. (N/A) TRANSESOPHAGEAL ECHOCARDIOGRAM (TEE) (N/A) ENDOVEIN HARVEST OF GREATER SAPHENOUS VEIN (Right)   1 conts to do well POD # 2  2 afebrile, some HTN, sinus rhythm- coreg started, may be able to restart ARB soon 3 sats good on 2 liters  4 normal renal fxn, some volume overload- starting furosemide 5 d/c chest tubes 6 some increase in leukocytosis, prob SIRS and should come down soon 7 thrombocytopenia- improving trend 8 BS adeq controlled- not a diabetic or on meds at home- usual protocols 9 routine pulm toilet/rehab, CXR shows some atx- minor 10 tx to floor   John Giovanni PA-C Pager [phone] 02/24/2020 10:17 AM    I have seen and examined the patient and agree with the assessment and plan as outlined.  Doing well.  Mobilize.  Diuresis.  D/C tubes.  Start low dose beta blocker.  Restart Cozaar tomorrow if renal function stable.  Transfer 4E  Rexene Alberts, MD 02/24/2020 12:28 PM

## 2020-02-24 NOTE — Plan of Care (Signed)

## 2020-02-24 NOTE — Progress Notes (Signed)
Progress Note  Patient Name: Alan Mcdonald Date of Encounter: 02/24/2020  [facility] Cardiologist:  Has been seeing cardiologist at [facility], will be happy to see him here if he so chooses.  Subjective   Sitting up in chair.  Slowly progressing.  Thankful.  Wife in room.  Inpatient Medications    Scheduled Meds: . acetaminophen  1,000 mg Oral Q6H  . aspirin EC  81 mg Oral Daily  . atorvastatin  80 mg Oral QPM  . bisacodyl  10 mg Oral Daily   Or  . bisacodyl  10 mg Rectal Daily  . carvedilol  3.125 mg Oral BID WC  . Chlorhexidine Gluconate Cloth  6 each Topical Daily  . clopidogrel  75 mg Oral Daily  . [place], Patient & Family Education   Does not apply Once  . docusate sodium  200 mg Oral Daily  . enoxaparin (LOVENOX) injection  30 mg Subcutaneous QHS  . furosemide  40 mg Intravenous BID  . pantoprazole  40 mg Oral Daily  . sodium chloride flush  3 mL Intravenous Q12H   Continuous Infusions: . sodium chloride    . cefUROXime (ZINACEF)  IV 1.5 g (02/24/20 0750)  . lactated ringers Stopped (02/23/20 1012)   PRN Meds: metoprolol tartrate, morphine injection, ondansetron (ZOFRAN) IV, oxyCODONE, sodium chloride flush, traMADol   Vital Signs    Vitals:   02/24/20 0500 02/24/20 0600 02/24/20 0700 02/24/20 0818  BP: 140/80 (!) 153/82 (!) 144/77   Pulse: 68 67 72   Resp: (!) 21 (!) 22 (!) 21   Temp:    98.3 F (36.8 C)  TempSrc:    Oral  SpO2: 95% 95% 92%   Weight: 78.2 kg     Height:        Intake/Output Summary (Last 24 hours) at 02/24/2020 0949 Last data filed at 02/24/2020 0800 Gross per 24 hour  Intake 484.7 ml  Output 1570 ml  Net -1085.3 ml   Last 3 Weights 02/24/2020 02/23/2020 02/22/2020  Weight (lbs) 172 lb 6.4 oz 173 lb 1 oz 154 lb 11.2 oz  Weight (kg) 78.2 kg 78.5 kg 70.171 kg      Telemetry    No adverse arrhythmias- Personally Reviewed  ECG    No new- Personally Reviewed  Physical Exam   GEN: No acute distress.   Neck:  No JVD Cardiac: RRR, no murmurs, rubs, or gallops.  Chest scar dressed Respiratory: Clear to auscultation bilaterally. GI: Soft, nontender, non-distended  MS:  Minor lower extremity edema; No deformity. Neuro:  Nonfocal  Psych: Normal affect   Labs    High Sensitivity Troponin:   Recent Labs  Lab 02/21/20 1555 02/21/20 2140  TROPONINIHS 293* 418*      Chemistry Recent Labs  Lab 02/23/20 0407 02/23/20 0407 02/23/20 0419 02/23/20 1640 02/24/20 0415  NA 139   < > 142 139 137  K 4.0   < > 4.0 3.7 4.4  CL 111  --   --  107 105  CO2 21*  --   --  22 22  GLUCOSE 143*  --   --  165* 155*  BUN 10  --   --  14 22  CREATININE 0.79  --   --  1.02 1.15  CALCIUM 7.5*  --   --  8.0* 8.1*  GFRNONAA >60  --   --  >60 >60  GFRAA >60  --   --  >60 >60  ANIONGAP 7  --   --  10 10   < > = values in this interval not displayed.     Hematology Recent Labs  Lab 02/23/20 0407 02/23/20 0407 02/23/20 0419 02/23/20 1640 02/24/20 0415  WBC 16.7*  --   --  17.6* 18.6*  RBC 4.08*  --   --  3.64* 3.43*  HGB 12.9*   < > 11.6* 11.3* 10.7*  HCT 37.4*   < > 34.0* 33.3* 31.6*  MCV 91.7  --   --  91.5 92.1  MCH 31.6  --   --  31.0 31.2  MCHC 34.5  --   --  33.9 33.9  RDW 13.6  --   --  14.0 14.5  PLT 131*  --   --  122* 128*   < > = values in this interval not displayed.    BNP Recent Labs  Lab 02/21/20 1555  BNP 147.0*     DDimer No results for input(s): DDIMER in the last 168 hours.   Radiology    CARDIAC CATHETERIZATION  Result Date: 02/22/2020  Severe distal left main disease  Proximal LAD 60-70% and 80% mid before large diagonal.  95% ostial circumflex stenosis and 70% dominant obtuse marginal proximal stenosis.  Diffuse proximal distal 60% stenosis some of which is diffuse in-stent.  Faint septal perforator collaterals emanate from the PDA towards the LAD which is not opacified.  LV function is normal.  LVEDP is 8 mmHg.  Heavily calcified aortic valve with 15 mm mean  gradient on pullback. RECOMMENDATIONS:  He developed prolonged chest pain immediately post cath.  IV fluid bolus was administered, IV nitroglycerin started, and 50 mcg of fentanyl were given.  The pain gradually resolved over 15 to 20 minutes.  In lab consult with Dr. Roxy Manns we decided to do urgent CABG on the patient.  Initial plan to place intra-aortic balloon pump was decided against after decision for urgent surgery.  DG Chest Port 1 View  Result Date: 02/24/2020 CLINICAL DATA:  Status post cardiac surgery EXAM: PORTABLE CHEST 1 VIEW COMPARISON:  02/23/2020 FINDINGS: Swan-Ganz catheter has been removed in the interval. Mediastinal drain and pericardial drain are again seen and stable. Right jugular sheath remains in place. Lungs are well aerated bilaterally. Mild bibasilar atelectatic changes are seen. Left-sided chest tube is again noted and stable. No pneumothorax is seen. No bony abnormality is noted. IMPRESSION: Tubes and lines as described above. Mild bibasilar atelectasis. Electronically Signed   By: Inez Catalina M.D.   On: 02/24/2020 08:13   DG Chest Port 1 View  Result Date: 02/23/2020 CLINICAL DATA:  Chest tube EXAM: PORTABLE CHEST 1 VIEW COMPARISON:  02/22/2020 FINDINGS: Interval extubation and removal of NG tube. Swan-Ganz catheter and left chest tube remain in place, unchanged. No pneumothorax. Vascular congestion and bibasilar atelectasis with low lung volumes, similar to prior study. IMPRESSION: Interval extubation. Low lung volumes with vascular congestion and bibasilar atelectasis. Electronically Signed   By: Rolm Baptise M.D.   On: 02/23/2020 08:10   DG Chest Port 1 View  Result Date: 02/22/2020 CLINICAL DATA:  Status post aortic valve replacement EXAM: PORTABLE CHEST 1 VIEW COMPARISON:  02/21/2020 FINDINGS: Support Apparatus: --Endotracheal tube: Tip just below the level of the clavicular heads. --Enteric tube:Tip and sideport are below the field of view. --Catheter(s):Right IJ  approach Swan-Ganz catheter tip projects over the main pulmonary artery. --Other: Mediastinal drains and left chest tube are present. Lingular atelectasis. No pneumothorax. No sizable pleural effusion. IMPRESSION: Satisfactory appearance of postoperative support apparatus.  Atelectasis in the lingula. Electronically Signed   By: Ulyses Jarred M.D.   On: 02/22/2020 22:52   ECHO INTRAOPERATIVE TEE  Result Date: 02/23/2020  *INTRAOPERATIVE TRANSESOPHAGEAL REPORT *  Patient Name:   ARYON NHAM Date of Exam: 02/22/2020 Medical Rec #:  [id number]   Height:       63.0 in Accession #:    [id number]  Weight:       154.7 lb Date of Birth:  17-May-1951   BSA:          1.73 m Patient Age:    55 years    BP:           123/77 mmHg Patient Gender: M           HR:           62 bpm. Exam Location:  Inpatient Transesophogeal exam was perform intraoperatively during surgical procedure. Patient was closely monitored under general anesthesia during the entirety of examination. Indications:     CABG Sonographer:     Clayton Lefort RDCS (AE) Performing Phys: [place] Phys: Roberts Gaudy MD PROCEDURE: Intraoperative Transesophogeal The TEE exam was begun by Dr. Oleta Mouse who inserted the probe and performed the pre-bypass exam. The post-bypass exam was performed by Dr. Roberts Gaudy. The report was made by Dr. Roberts Gaudy. Complications: No known complications during this procedure. PRE-OP FINDINGS  Left Ventricle: The left ventricle has hyperdynamic systolic function, with an ejection fraction of >65%. The cavity size was normal. There is moderately increased left ventricular wall thickness. On the post-bypass exam, there was LV contractile dyssynchrony due to ventricular pacing. There was mild to moderate diffuse LV hypokinesis. The ejection fraction was estimated at 40-45%. Right Ventricle: On the pre-bypass exam, the RV was mildly enlarged. There was normal RV systolic function. On the post-bypass exam, the RV  size was increased from the pre-bypass exam. There was moderate RV systolic dysfunction which improved during the post-bypass period. There was no interventricular septal flattening noted. Left Atrium: The left atrial appendage is well visualized and there is evidence of thrombus present. Right Atrium: On the pre-bypass exam, the interatrial septum was in a neutral position. On the post-bypass exam, the interatrial septum bowed from right to left consistent with elevated right sided pressures. Interatrial Septum: No atrial level shunt detected by color flow Doppler. Pericardium: There is no evidence of pericardial effusion. Mitral Valve: The mitral valve leaflets had normal thickness. There was mild mitral annular calcification which involved the base of the posterior leaflet. The leaflets opened normally. The leaflets coapted normally and there was mild mitral insuuficiency. On the post-bypass exam, the mitral valve appeared unchanged from the pre-bypass exam, there was trace mitral insufficiency. Tricuspid Valve: The tricuspid valve was normal in structure. Tricuspid valve regurgitation is trivial by color flow Doppler. On the post-bypass exam, there was trivial tricuspid regurgitaion. Aortic Valve: The aortic valve was tri-leaflet with severe leaflet thickening and restriction to opening. The left coronary cusp was immobile and the right and noncoronary cusps had restricted motion. The was mild to moderate aortic stenosis. The peak transaortic velocity was 2.79 m/sec. with a peak gradient of 31 mm hg and a mean gradient of 17 mm hg. The velocity ratio was 0.46. There was no aortic insufficiency seen. On the post-bypass exam, there was a bio-prosthetic valve in the aortic position. The leaflets opened normally. There was a small central jet of aortic insufficiency which was within the sewing ring. The  peak trans-aortic velocity was 1.24 m/sec. with a mean gradient of 3 mm hg. Pulmonic Valve: The pulmonic valve was  normal in structure, with normal. Pulmonic valve regurgitation is trivial by color flow Doppler. Aorta: The ascending aortic and aortic root were normal in size with normal wall thickness.  Roberts Gaudy MD Electronically signed by Roberts Gaudy MD Signature Date/Time: 02/23/2020/3:40:50 PM    Final     Assessment & Plan    69 year old male with non-ST elevation myocardial infarction, moderate aortic stenosis who underwent cardiac catheterization revealing severe left main disease, urgent CABG, Dr. Roxy Manns.  25 mm aortic valve replacement bovine.  -Continue to mobilize.  He walked the hallway today.  Excellent. -Progressing.  Diuresing. -Dual antiplatelet therapy secondary to non-ST elevation myocardial infarction -High intensity statin. -Cardiac rehab -Dental prophylaxis for aortic valve replacement.       For questions or updates, please contact [place] Please consult [email or web address] for contact info under        Signed, Candee Furbish, MD  02/24/2020, 9:49 AM

## 2020-02-25 ENCOUNTER — Ambulatory Visit: Payer: Medicare Other | Admitting: Allergy

## 2020-02-25 ENCOUNTER — Inpatient Hospital Stay (HOSPITAL_COMMUNITY): Payer: No Typology Code available for payment source

## 2020-02-25 LAB — BASIC METABOLIC PANEL
Anion gap: 9 (ref 5–15)
BUN: 23 mg/dL (ref 8–23)
CO2: 25 mmol/L (ref 22–32)
Calcium: 7.9 mg/dL — ABNORMAL LOW (ref 8.9–10.3)
Chloride: 102 mmol/L (ref 98–111)
Creatinine, Ser: 0.95 mg/dL (ref 0.61–1.24)
GFR calc Af Amer: >60 mL/min (ref >60)
GFR calc non Af Amer: >60 mL/min (ref >60)
Glucose, Bld: 132 mg/dL — ABNORMAL HIGH (ref 70–99)
Potassium: 3.7 mmol/L (ref 3.5–5.1)
Sodium: 136 mmol/L (ref 135–145)

## 2020-02-25 LAB — CBC
HCT: 26 % — ABNORMAL LOW (ref 39.0–52.0)
Hemoglobin: 8.8 g/dL — ABNORMAL LOW (ref 13.0–17.0)
MCH: 31.5 pg (ref 26.0–34.0)
MCHC: 33.8 g/dL (ref 30.0–36.0)
MCV: 93.2 fL (ref 80.0–100.0)
Platelets: 85 10*3/uL — ABNORMAL LOW (ref 150–400)
RBC: 2.79 MIL/uL — ABNORMAL LOW (ref 4.22–5.81)
RDW: 14.2 % (ref 11.5–15.5)
WBC: 10.5 10*3/uL (ref 4.0–10.5)
nRBC: 0 % (ref 0.0–0.2)

## 2020-02-25 IMAGING — CR DG CHEST 2V
2 series · 2 of 2 positions shown · non-contrast
Comparison: [DATE]

CLINICAL DATA: Atelectasis, CABG

EXAM:
CHEST - 2 VIEW

[chest pa]
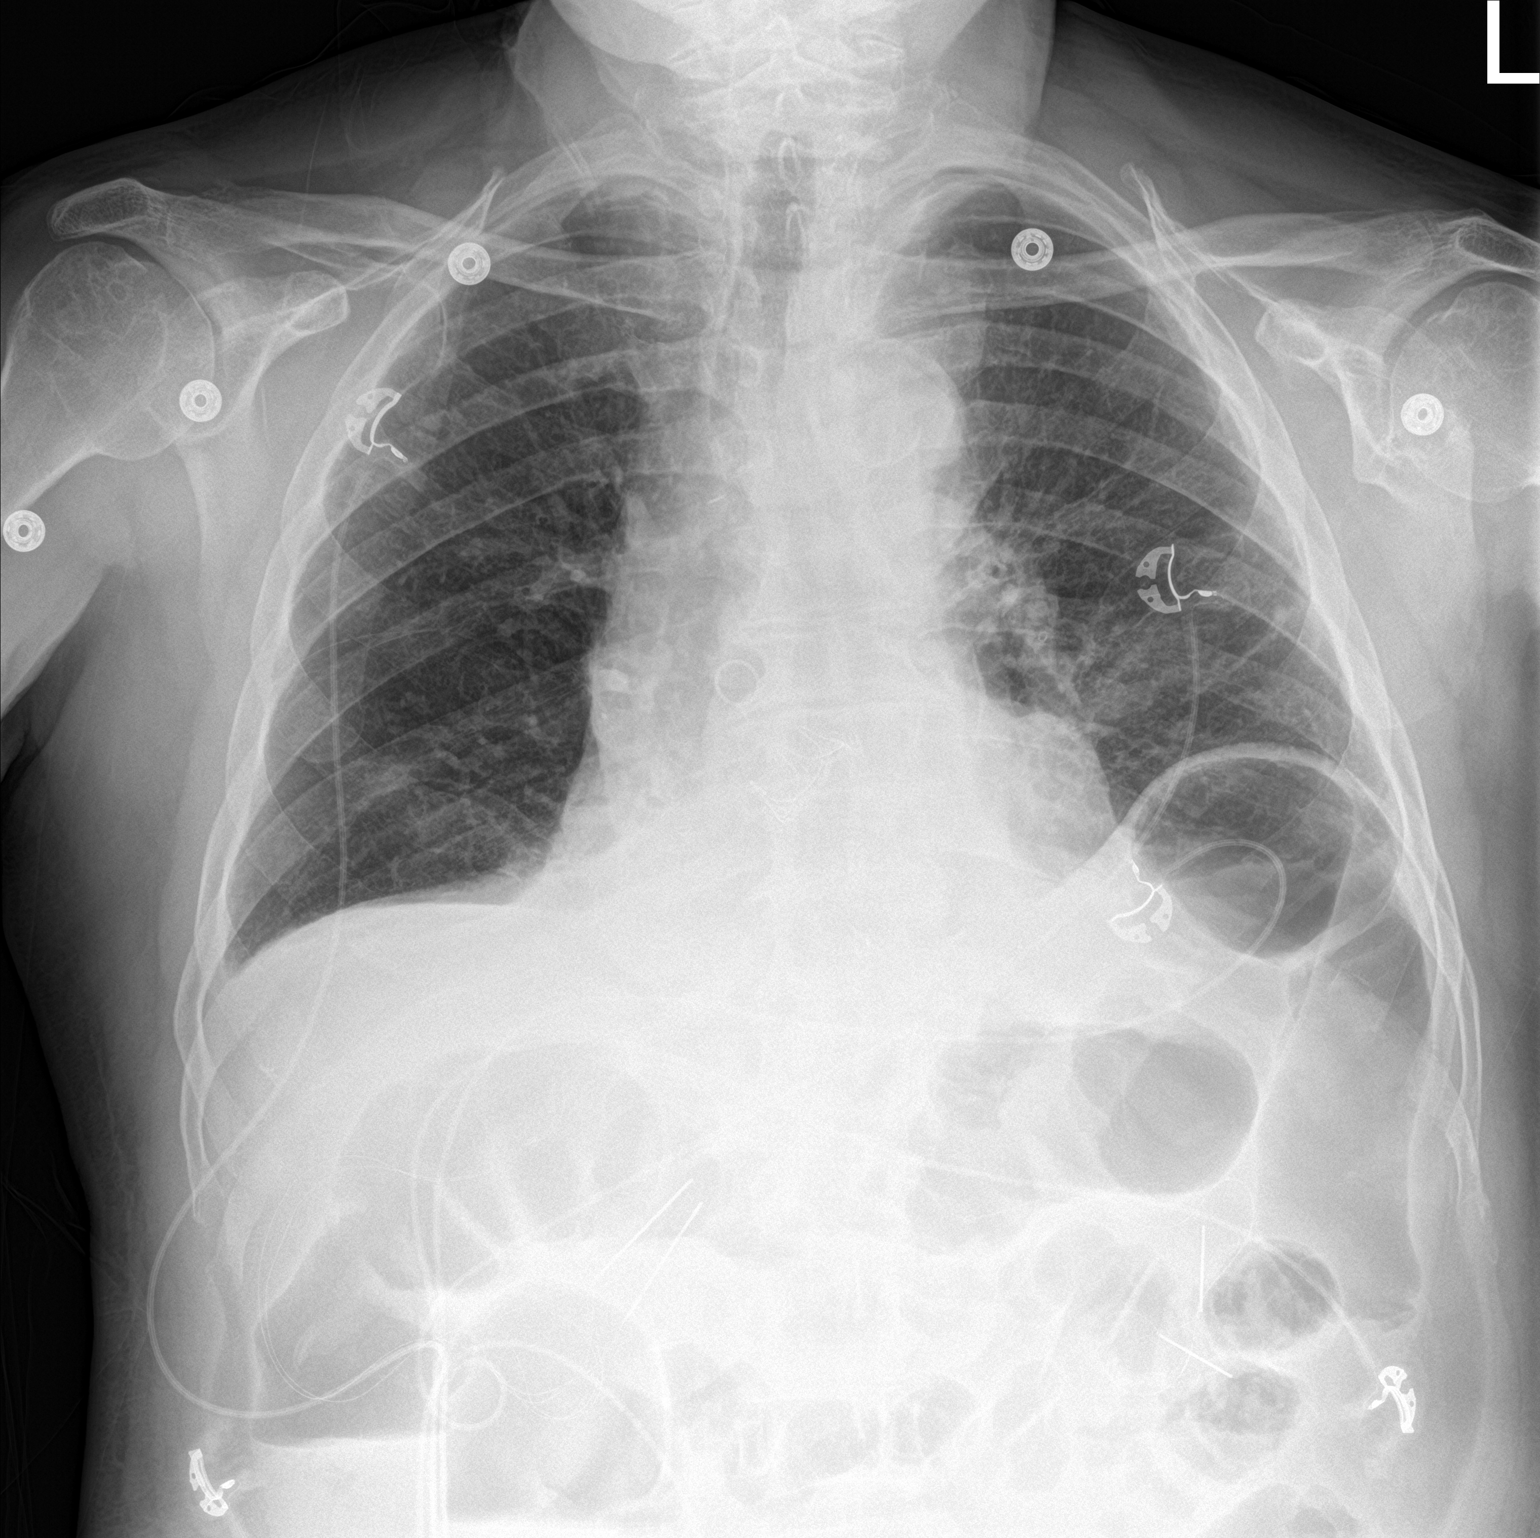

[chest lat]
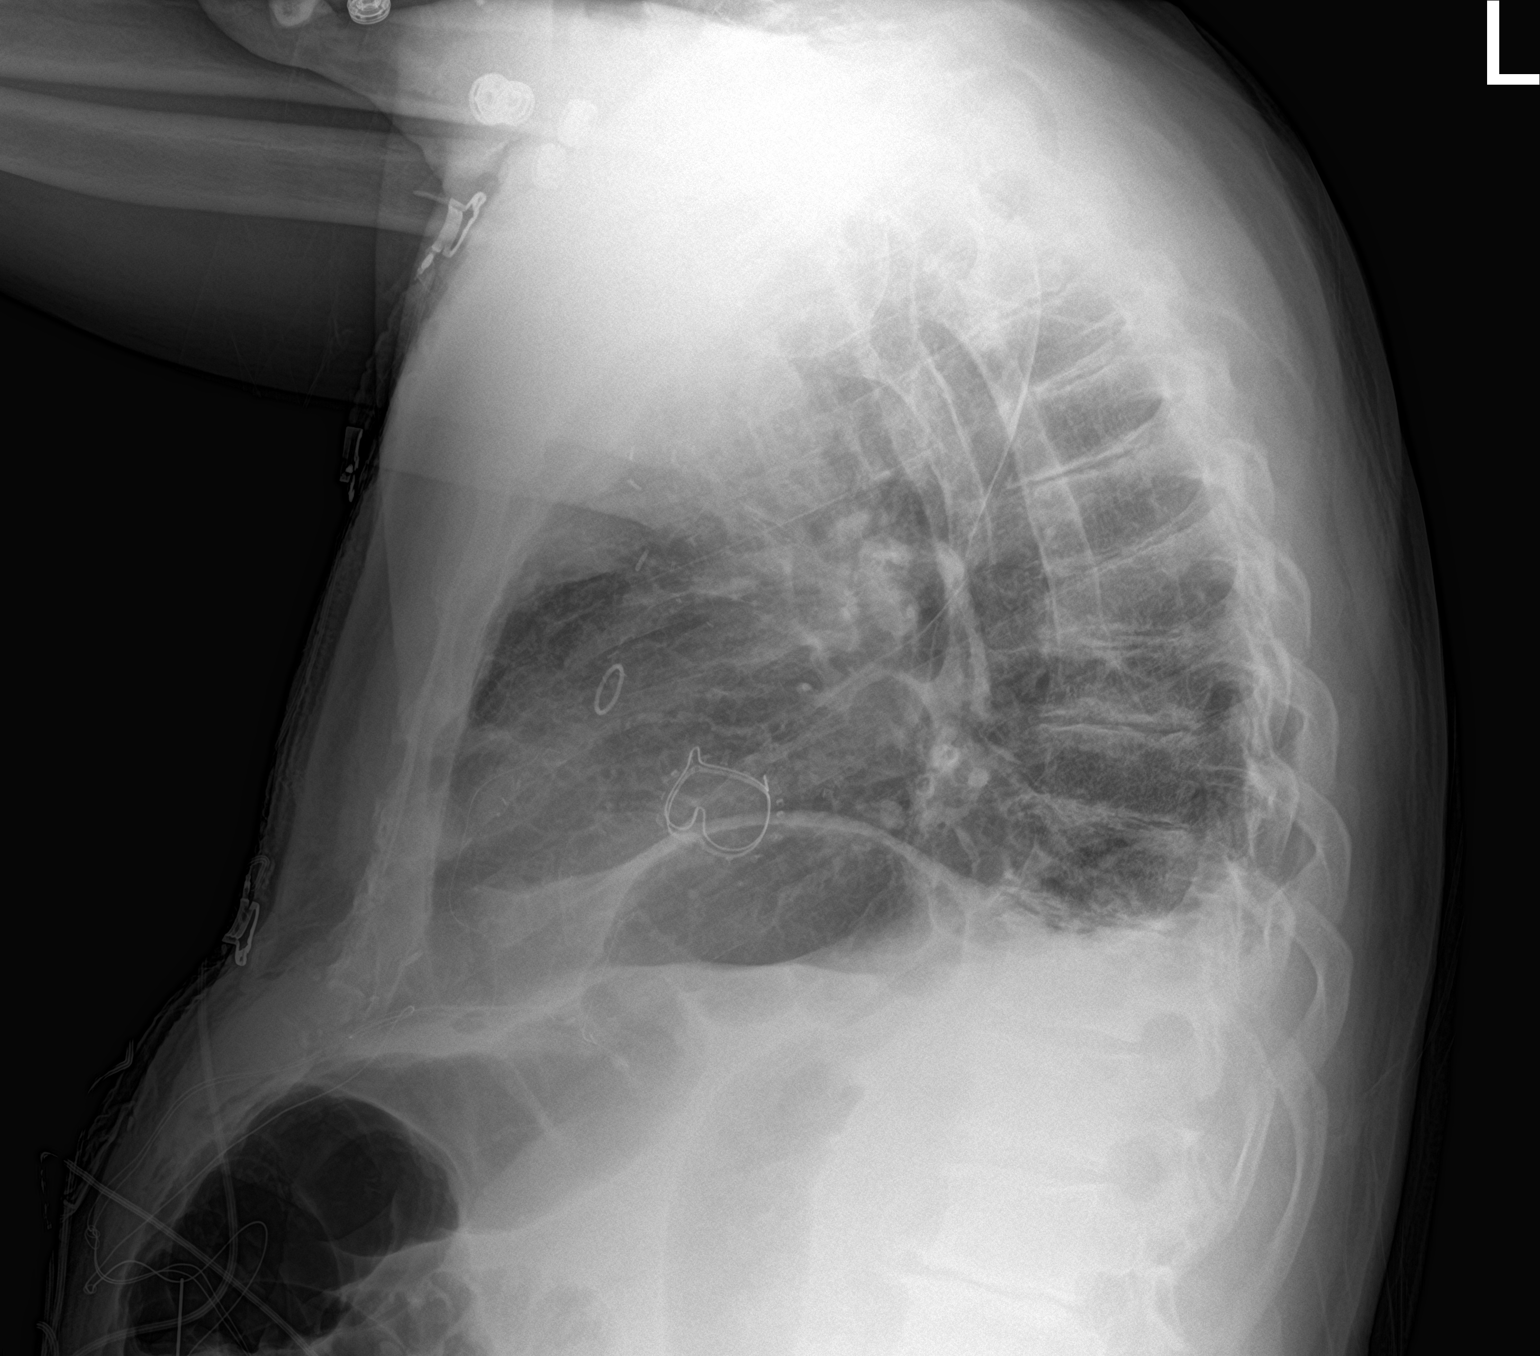

[2 of 2 positions shown; findings below may reference images not displayed]

FINDINGS: Changes of CABG and valve replacement. Heart is normal size. No
confluent airspace opacities, effusions or pneumothorax. No acute
bony abnormality.
IMPRESSION: Improving aeration in the lung bases.  No confluent opacities.

## 2020-02-25 MED ORDER — LOSARTAN POTASSIUM 50 MG PO TABS
50.0000 mg | ORAL_TABLET | Freq: Every day | ORAL | Status: DC
  Administered 2020-02-25 – 2020-02-26 (×2): 50 mg via ORAL
  Filled 2020-02-25 (×2): qty 1

## 2020-02-25 MED FILL — Electrolyte-R (PH 7.4) Solution: INTRAVENOUS | Qty: 5000 | Status: AC

## 2020-02-25 MED FILL — Heparin Sodium (Porcine) Inj 1000 Unit/ML: INTRAMUSCULAR | Qty: 10 | Status: AC

## 2020-02-25 MED FILL — Calcium Chloride Inj 10%: INTRAVENOUS | Qty: 10 | Status: AC

## 2020-02-25 MED FILL — Mannitol IV Soln 20%: INTRAVENOUS | Qty: 500 | Status: AC

## 2020-02-25 MED FILL — Sodium Chloride IV Soln 0.9%: INTRAVENOUS | Qty: 2000 | Status: AC

## 2020-02-25 MED FILL — Sodium Bicarbonate IV Soln 8.4%: INTRAVENOUS | Qty: 50 | Status: AC

## 2020-02-25 NOTE — Progress Notes (Signed)
Mobility Specialist - Progress Note   02/25/20 1511  Mobility  Activity Ambulated in hall  Level of Assistance Modified independent, requires aide device or extra time  Assistive Device Front wheel walker  Distance Ambulated (ft) 300 ft  Mobility Response Tolerated well  Mobility performed by Mobility specialist  $Mobility charge 1 Mobility    During mobility: 105 HR Post-mobility: 91 HR  Pt was preparing to ambulate in hall w/ his wife. He says this was his 4th walk of the day. Pt left sitting in the bed w/ his wife in the room after ambulating. VSS.  Pricilla Handler Mobility Specialist Mobility Specialist Phone: [phone]

## 2020-02-25 NOTE — Progress Notes (Signed)
Progress Note  Patient Name: Alan Mcdonald Date of Encounter: 02/25/2020  [facility] Cardiologist: No primary care provider on file. Yashas Camilli  Subjective   No CP, no SOB  Inpatient Medications    Scheduled Meds: . acetaminophen  1,000 mg Oral Q6H  . aspirin EC  81 mg Oral Daily  . atorvastatin  80 mg Oral QPM  . bisacodyl  10 mg Oral Daily   Or  . bisacodyl  10 mg Rectal Daily  . carvedilol  3.125 mg Oral BID WC  . Chlorhexidine Gluconate Cloth  6 each Topical Daily  . clopidogrel  75 mg Oral Daily  . docusate sodium  200 mg Oral Daily  . enoxaparin (LOVENOX) injection  30 mg Subcutaneous QHS  . influenza vaccine adjuvanted  0.5 mL Intramuscular Tomorrow-1000  . losartan  50 mg Oral Daily  . pantoprazole  40 mg Oral Daily  . sodium chloride flush  3 mL Intravenous Q12H   Continuous Infusions: . sodium chloride    . lactated ringers Stopped (02/23/20 1012)   PRN Meds: metoprolol tartrate, morphine injection, ondansetron (ZOFRAN) IV, oxyCODONE, sodium chloride flush, traMADol   Vital Signs    Vitals:   02/24/20 2356 02/25/20 0402 02/25/20 0600 02/25/20 0737  BP: 119/68 140/66  138/81  Pulse: 81 82  83  Resp: 16 16  19   Temp: 98.1 F (36.7 C) 98.8 F (37.1 C)  99.1 F (37.3 C)  TempSrc: Oral Oral  Oral  SpO2: 97% 91%  90%  Weight:   77.3 kg   Height:        Intake/Output Summary (Last 24 hours) at 02/25/2020 0902 Last data filed at 02/25/2020 0300 Gross per 24 hour  Intake 120 ml  Output 1660 ml  Net -1540 ml   Last 3 Weights 02/25/2020 02/24/2020 02/23/2020  Weight (lbs) 170 lb 6.4 oz 172 lb 6.4 oz 173 lb 1 oz  Weight (kg) 77.293 kg 78.2 kg 78.5 kg      Telemetry    No adverse rhythms - Personally Reviewed  ECG    No new - Personally Reviewed  Physical Exam   GEN: No acute distress.   Neck: No JVD Cardiac: RRR, no murmurs, rubs, or gallops. CABG scar dressed Respiratory: Clear to auscultation bilaterally. GI: Soft, nontender, non-distended   MS: No edema; No deformity. Neuro:  Nonfocal  Psych: Normal affect   Labs    High Sensitivity Troponin:   Recent Labs  Lab 02/21/20 1555 02/21/20 2140  TROPONINIHS 293* 418*      Chemistry Recent Labs  Lab 02/23/20 1640 02/24/20 0415 02/25/20 0450  NA 139 137 136  K 3.7 4.4 3.7  CL 107 105 102  CO2 22 22 25   GLUCOSE 165* 155* 132*  BUN 14 22 23   CREATININE 1.02 1.15 0.95  CALCIUM 8.0* 8.1* 7.9*  GFRNONAA >60 >60 >60  GFRAA >60 >60 >60  ANIONGAP 10 10 9      Hematology Recent Labs  Lab 02/23/20 1640 02/24/20 0415 02/25/20 0450  WBC 17.6* 18.6* 10.5  RBC 3.64* 3.43* 2.79*  HGB 11.3* 10.7* 8.8*  HCT 33.3* 31.6* 26.0*  MCV 91.5 92.1 93.2  MCH 31.0 31.2 31.5  MCHC 33.9 33.9 33.8  RDW 14.0 14.5 14.2  PLT 122* 128* 85*    BNP Recent Labs  Lab 02/21/20 1555  BNP 147.0*     DDimer No results for input(s): DDIMER in the last 168 hours.   Radiology    DG Chest 2  View  Result Date: 02/25/2020 CLINICAL DATA:  Atelectasis, CABG EXAM: CHEST - 2 VIEW COMPARISON:  02/24/2020 FINDINGS: Changes of CABG and valve replacement. Heart is normal size. No confluent airspace opacities, effusions or pneumothorax. No acute bony abnormality. IMPRESSION: Improving aeration in the lung bases.  No confluent opacities. Electronically Signed   By: Rolm Baptise M.D.   On: 02/25/2020 08:23   DG Chest Port 1 View  Result Date: 02/24/2020 CLINICAL DATA:  Status post cardiac surgery EXAM: PORTABLE CHEST 1 VIEW COMPARISON:  02/23/2020 FINDINGS: Swan-Ganz catheter has been removed in the interval. Mediastinal drain and pericardial drain are again seen and stable. Right jugular sheath remains in place. Lungs are well aerated bilaterally. Mild bibasilar atelectatic changes are seen. Left-sided chest tube is again noted and stable. No pneumothorax is seen. No bony abnormality is noted. IMPRESSION: Tubes and lines as described above. Mild bibasilar atelectasis. Electronically Signed   By: Inez Catalina M.D.   On: 02/24/2020 08:13    Cardiac Studies   Ejection fraction 60 to 65% on recent echocardiogram.  Patient Profile     69 y.o. male with non-STEMI, subsequent CABG secondary to severe left main disease, aortic valve replacement secondary to moderate aortic stenosis.  Assessment & Plan    Non-ST elevation myocardial infarction/CAD/CABG/aortic valve replacement -Progressing.  Hopeful discharge tomorrow.  Reviewed Dr. Guy Sandifer note. -Dual antiplatelet therapy, aspirin and Plavix. -High intensity statin, goal LDL less than 70. -Outpatient dental prophylaxis for 25 mm bovine aortic valve replacement. -Low-dose losartan started.       For questions or updates, please contact [place] Please consult [email or web address] for contact info under        Signed, Candee Furbish, MD  02/25/2020, 9:02 AM

## 2020-02-25 NOTE — Progress Notes (Addendum)
[address]       [place] [address]             [phone]      3 Days Post-Op Procedure(s) (LRB): CORONARY ARTERY BYPASS GRAFTING (CABG) USING LIMA to LAD; ENDSCOPICALLY HARVEST RIGHT GREATER SAPHENOUS VEIN: SVG to OM1 (N/A) AORTIC VALVE REPLACEMENT (AVR) USING EDWARDS Resilia 25 MM AORTIC VALVE. (N/A) TRANSESOPHAGEAL ECHOCARDIOGRAM (TEE) (N/A) ENDOVEIN HARVEST OF GREATER SAPHENOUS VEIN (Right) Subjective: Feels well, conts to have poor appetite but otherwise no specific c/o  Objective: Vital signs in last 24 hours: Temp:  [98.1 F (36.7 C)-98.8 F (37.1 C)] 98.8 F (37.1 C) (09/16 0402) Pulse Rate:  [71-82] 82 (09/16 0402) Cardiac Rhythm: Normal sinus rhythm (09/15 2055) Resp:  [16-23] 16 (09/16 0402) BP: (119-167)/(66-82) 140/66 (09/16 0402) SpO2:  [90 %-97 %] 91 % (09/16 0402) Weight:  [77.3 kg] 77.3 kg (09/16 0600)  Hemodynamic parameters for last 24 hours:    Intake/Output from previous day: 09/15 0701 - 09/16 0700 In: 240 [P.O.:240] Out: 1690 [Urine:1600; Chest Tube:90] Intake/Output this shift: No intake/output data recorded.  General appearance: alert, cooperative and no distress Heart: regular rate and rhythm Lungs: mildly dim in bases Abdomen: benign Extremities: no edema Wound: incis healing well  Lab Results: Recent Labs    02/23/20 1640 02/24/20 0415  WBC 17.6* 18.6*  HGB 11.3* 10.7*  HCT 33.3* 31.6*  PLT 122* 128*   BMET:  Recent Labs    02/24/20 0415 02/25/20 0450  NA 137 136  K 4.4 3.7  CL 105 102  CO2 22 25  GLUCOSE 155* 132*  BUN 22 23  CREATININE 1.15 0.95  CALCIUM 8.1* 7.9*    PT/INR:  Recent Labs    02/22/20 2240  LABPROT 18.9*  INR 1.7*   ABG    Component Value Date/Time   PHART 7.364 02/23/2020 0419   HCO3 21.0 02/23/2020 0419   TCO2 22 02/23/2020 0419   ACIDBASEDEF 4.0 (H) 02/23/2020 0419   O2SAT 94.0 02/23/2020 0419   CBG (last 3)  Recent Labs    02/23/20 2347 02/24/20 0358  02/24/20 0815  GLUCAP 149* 138* 153*    Meds Scheduled Meds: . acetaminophen  1,000 mg Oral Q6H  . aspirin EC  81 mg Oral Daily  . atorvastatin  80 mg Oral QPM  . bisacodyl  10 mg Oral Daily   Or  . bisacodyl  10 mg Rectal Daily  . carvedilol  3.125 mg Oral BID WC  . Chlorhexidine Gluconate Cloth  6 each Topical Daily  . clopidogrel  75 mg Oral Daily  . docusate sodium  200 mg Oral Daily  . enoxaparin (LOVENOX) injection  30 mg Subcutaneous QHS  . furosemide  40 mg Intravenous BID  . influenza vaccine adjuvanted  0.5 mL Intramuscular Tomorrow-1000  . pantoprazole  40 mg Oral Daily  . sodium chloride flush  3 mL Intravenous Q12H   Continuous Infusions: . sodium chloride    . lactated ringers Stopped (02/23/20 1012)   PRN Meds:.metoprolol tartrate, morphine injection, ondansetron (ZOFRAN) IV, oxyCODONE, sodium chloride flush, traMADol  Xrays DG Chest Port 1 View  Result Date: 02/24/2020 CLINICAL DATA:  Status post cardiac surgery EXAM: PORTABLE CHEST 1 VIEW COMPARISON:  02/23/2020 FINDINGS: Swan-Ganz catheter has been removed in the interval. Mediastinal drain and pericardial drain are again seen and stable. Right jugular sheath remains in place. Lungs are well aerated bilaterally. Mild bibasilar atelectatic changes are seen. Left-sided  chest tube is again noted and stable. No pneumothorax is seen. No bony abnormality is noted. IMPRESSION: Tubes and lines as described above. Mild bibasilar atelectasis. Electronically Signed   By: Inez Catalina M.D.   On: 02/24/2020 08:13    Assessment/Plan: S/P Procedure(s) (LRB): CORONARY ARTERY BYPASS GRAFTING (CABG) USING LIMA to LAD; ENDSCOPICALLY HARVEST RIGHT GREATER SAPHENOUS VEIN: SVG to OM1 (N/A) AORTIC VALVE REPLACEMENT (AVR) USING EDWARDS Resilia 25 MM AORTIC VALVE. (N/A) TRANSESOPHAGEAL ECHOCARDIOGRAM (TEE) (N/A) ENDOVEIN HARVEST OF GREATER SAPHENOUS VEIN (Right)  1 doing well POD#3 2 some HTN, creat ok, will resume cozaar 1/2 dose  for now, afeb, sinus rhythm, sats good on RA 3 CXR clear lung fields 4 cont routine pulm toilet and cardiac rehab 5 d/c wires 6 poss home in am    LOS: 4 days    John Giovanni PA-C Pager [phone] 02/25/2020    I have seen and examined the patient and agree with the assessment and plan as outlined.  Doing very well although still w/out any appetite and reporting some nausea.  Continue low dose beta blocker.  Resume Cozaar at reduced dose and watch BP.  Continue DAPT and high dose statin.  Mobilize.  Possible d/c home 1-2 days if he's eating and drinking better.  Rexene Alberts, MD 02/25/2020 8:54 AM

## 2020-02-25 NOTE — Progress Notes (Signed)
CARDIAC REHAB PHASE I   PRE:  Rate/Rhythm: 85 SR    BP: sitting 136/63    SaO2: 91 RA  MODE:  Ambulation: 270 ft   POST:  Rate/Rhythm: 98 SR    BP: sitting 148/74     SaO2: 93 RA  Pt c/o abdomen pain this am after taking meds. Able to walk in hall and did belch some to relieve gas. Felt some better. Steady with RW, VSS. Encouraged IS and more walking. Can walk with wife if needed. [phone]   [place], ACSM 02/25/2020 11:29 AM

## 2020-02-25 NOTE — Progress Notes (Signed)
Pt ambulated 800 ft with front wheel walker, stopped once halfway through. HR stayed in NSR 90's. Tolarated well. Will continue to monitor.

## 2020-02-25 NOTE — Plan of Care (Signed)
Continue to monitor

## 2020-02-25 NOTE — Progress Notes (Addendum)
Pacing wires removed at this time. Patient tolerated well. Patient instructed on bed rest for an hour. Patient verbalized understanding. Mid sternal and chest tube dressings removed, clean dry and intact. Mid sternal incision painted with betadine. Will continue to monitor.

## 2020-02-25 NOTE — Plan of Care (Signed)

## 2020-02-25 NOTE — Care Management Important Message (Signed)
Important Message  Patient Details  Name: Alan Mcdonald MRN: [id number] Date of Birth: [birth year]   Medicare Important Message Given:  Yes     [name] 02/25/2020, 9:13 AM

## 2020-02-26 LAB — TYPE AND SCREEN
ABO/RH(D): O POS
Antibody Screen: NEGATIVE
Unit division: 0
Unit division: 0
Unit division: 0
Unit division: 0

## 2020-02-26 LAB — BPAM RBC
Blood Product Expiration Date: 202110132359
Blood Product Expiration Date: 202110152359
Blood Product Expiration Date: 202110152359
Blood Product Expiration Date: 202110152359
ISSUE DATE / TIME: 202109132025
ISSUE DATE / TIME: 202109132025
ISSUE DATE / TIME: 202109132025
ISSUE DATE / TIME: 202109132025
Unit Type and Rh: 5100
Unit Type and Rh: 5100
Unit Type and Rh: 5100
Unit Type and Rh: 5100

## 2020-02-26 MED ORDER — CARVEDILOL 3.125 MG PO TABS: 3.1250 mg | ORAL_TABLET | Freq: Two times a day (BID) | ORAL | 1 refills | Status: DC

## 2020-02-26 MED ORDER — ATORVASTATIN CALCIUM 80 MG PO TABS: 80.0000 mg | ORAL_TABLET | Freq: Every evening | ORAL | 1 refills | Status: AC

## 2020-02-26 MED ORDER — TRAMADOL HCL 50 MG PO TABS
50.0000 mg | ORAL_TABLET | Freq: Four times a day (QID) | ORAL | 0 refills | Status: AC | PRN
Start: 2020-02-26 — End: 2020-03-04

## 2020-02-26 NOTE — Plan of Care (Signed)
  Problem: Education: Goal: Knowledge of General Education information will improve Description: Including pain rating scale, medication(s)/side effects and non-pharmacologic comfort measures Outcome: Adequate for Discharge   

## 2020-02-26 NOTE — Progress Notes (Signed)
Progress Note  Patient Name: Alan Mcdonald Date of Encounter: 02/26/2020  [facility] Cardiologist: Candee Furbish, MD   Subjective   Wife at bedside.  Excited to be able to go home.  No chest pain.  Ambulating.  Inpatient Medications    Scheduled Meds: . acetaminophen  1,000 mg Oral Q6H  . aspirin EC  81 mg Oral Daily  . atorvastatin  80 mg Oral QPM  . bisacodyl  10 mg Oral Daily   Or  . bisacodyl  10 mg Rectal Daily  . carvedilol  3.125 mg Oral BID WC  . Chlorhexidine Gluconate Cloth  6 each Topical Daily  . clopidogrel  75 mg Oral Daily  . docusate sodium  200 mg Oral Daily  . enoxaparin (LOVENOX) injection  30 mg Subcutaneous QHS  . influenza vaccine adjuvanted  0.5 mL Intramuscular Tomorrow-1000  . losartan  50 mg Oral Daily  . pantoprazole  40 mg Oral Daily  . sodium chloride flush  3 mL Intravenous Q12H   Continuous Infusions: . sodium chloride    . lactated ringers Stopped (02/23/20 1012)   PRN Meds: metoprolol tartrate, morphine injection, ondansetron (ZOFRAN) IV, oxyCODONE, sodium chloride flush, traMADol   Vital Signs    Vitals:   02/25/20 2014 02/26/20 0011 02/26/20 0424 02/26/20 0812  BP: 127/61 135/68 132/66 139/89  Pulse: 82 85 82   Resp:  20 18 19   Temp: 99.5 F (37.5 C) 98.9 F (37.2 C) 98.6 F (37 C) 99.5 F (37.5 C)  TempSrc: Oral Oral Oral Oral  SpO2: 94% 95% 91% 95%  Weight:   74.7 kg   Height:        Intake/Output Summary (Last 24 hours) at 02/26/2020 1015 Last data filed at 02/26/2020 0428 Gross per 24 hour  Intake 0 ml  Output 750 ml  Net -750 ml   Last 3 Weights 02/26/2020 02/25/2020 02/24/2020  Weight (lbs) 164 lb 10.9 oz 170 lb 6.4 oz 172 lb 6.4 oz  Weight (kg) 74.7 kg 77.293 kg 78.2 kg      Telemetry    No adverse arrhythmias- Personally Reviewed    Physical Exam   GEN: No acute distress.   Neck: No JVD Cardiac: RRR, no murmurs, rubs, or gallops.  Scar well healing Respiratory: Clear to auscultation  bilaterally. GI: Soft, nontender, non-distended  MS: No edema; No deformity. Neuro:  Nonfocal  Psych: Normal affect   Labs    High Sensitivity Troponin:   Recent Labs  Lab 02/21/20 1555 02/21/20 2140  TROPONINIHS 293* 418*      Chemistry Recent Labs  Lab 02/23/20 1640 02/24/20 0415 02/25/20 0450  NA 139 137 136  K 3.7 4.4 3.7  CL 107 105 102  CO2 22 22 25   GLUCOSE 165* 155* 132*  BUN 14 22 23   CREATININE 1.02 1.15 0.95  CALCIUM 8.0* 8.1* 7.9*  GFRNONAA >60 >60 >60  GFRAA >60 >60 >60  ANIONGAP 10 10 9      Hematology Recent Labs  Lab 02/23/20 1640 02/24/20 0415 02/25/20 0450  WBC 17.6* 18.6* 10.5  RBC 3.64* 3.43* 2.79*  HGB 11.3* 10.7* 8.8*  HCT 33.3* 31.6* 26.0*  MCV 91.5 92.1 93.2  MCH 31.0 31.2 31.5  MCHC 33.9 33.9 33.8  RDW 14.0 14.5 14.2  PLT 122* 128* 85*    BNP Recent Labs  Lab 02/21/20 1555  BNP 147.0*     DDimer No results for input(s): DDIMER in the last 168 hours.   Radiology  DG Chest 2 View  Result Date: 02/25/2020 CLINICAL DATA:  Atelectasis, CABG EXAM: CHEST - 2 VIEW COMPARISON:  02/24/2020 FINDINGS: Changes of CABG and valve replacement. Heart is normal size. No confluent airspace opacities, effusions or pneumothorax. No acute bony abnormality. IMPRESSION: Improving aeration in the lung bases.  No confluent opacities. Electronically Signed   By: Rolm Baptise M.D.   On: 02/25/2020 08:23    Cardiac Studies   EF 65%  Patient Profile     69 y.o. male with non-STEMI emergent CABG secondary to left main disease with aortic valve replacement secondary to moderate aortic stenosis  Assessment & Plan    Non-STEMI -No further discomfort.  Dual antiplatelet therapy with aspirin and Plavix, high intensity statin, beta-blocker.  Angiotensin receptor blocker.  Normal EF.  Excellent. -Cardiac rehab.  CAD post CABG -Dr. Guy Sandifer op note reviewed.  Continue with secondary prevention.  Aortic valve replacement -Dental prophylaxis as  outpatient. -25 mm valve.  Bovine.  Essential hypertension -Agree with carvedilol, losartan.  Continue to monitor.  Has clinic follow-up 03/14/2020 with our team.      For questions or updates, please contact [place] Please consult [email or web address] for contact info under        Signed, Candee Furbish, MD  02/26/2020, 10:15 AM

## 2020-02-26 NOTE — Progress Notes (Addendum)
[address]       [place] [address]             [phone]      4 Days Post-Op Procedure(s) (LRB): CORONARY ARTERY BYPASS GRAFTING (CABG) USING LIMA to LAD; ENDSCOPICALLY HARVEST RIGHT GREATER SAPHENOUS VEIN: SVG to OM1 (N/A) AORTIC VALVE REPLACEMENT (AVR) USING EDWARDS Resilia 25 MM AORTIC VALVE. (N/A) TRANSESOPHAGEAL ECHOCARDIOGRAM (TEE) (N/A) ENDOVEIN HARVEST OF GREATER SAPHENOUS VEIN (Right) Subjective: Appetite and intake not normal but significantly improved  Objective: Vital signs in last 24 hours: Temp:  [98.6 F (37 C)-99.5 F (37.5 C)] 98.6 F (37 C) (09/17 0424) Pulse Rate:  [82-94] 82 (09/17 0424) Cardiac Rhythm: Normal sinus rhythm (09/16 2037) Resp:  [18-20] 18 (09/17 0424) BP: (124-159)/(60-83) 132/66 (09/17 0424) SpO2:  [90 %-95 %] 91 % (09/17 0424) Weight:  [74.7 kg] 74.7 kg (09/17 0424)  Hemodynamic parameters for last 24 hours:    Intake/Output from previous day: 09/16 0701 - 09/17 0700 In: 0  Out: 1350 [Urine:1350] Intake/Output this shift: No intake/output data recorded.  General appearance: alert, cooperative and no distress Heart: regular rate and rhythm Lungs: mildly dim in bases Abdomen: benign Extremities: no edema Wound: incis healing well  Lab Results: Recent Labs    02/24/20 0415 02/25/20 0450  WBC 18.6* 10.5  HGB 10.7* 8.8*  HCT 31.6* 26.0*  PLT 128* 85*   BMET:  Recent Labs    02/24/20 0415 02/25/20 0450  NA 137 136  K 4.4 3.7  CL 105 102  CO2 22 25  GLUCOSE 155* 132*  BUN 22 23  CREATININE 1.15 0.95  CALCIUM 8.1* 7.9*    PT/INR: No results for input(s): LABPROT, INR in the last 72 hours. ABG    Component Value Date/Time   PHART 7.364 02/23/2020 0419   HCO3 21.0 02/23/2020 0419   TCO2 22 02/23/2020 0419   ACIDBASEDEF 4.0 (H) 02/23/2020 0419   O2SAT 94.0 02/23/2020 0419   CBG (last 3)  Recent Labs    02/23/20 2347 02/24/20 0358 02/24/20 0815  GLUCAP 149* 138* 153*     Meds Scheduled Meds: . acetaminophen  1,000 mg Oral Q6H  . aspirin EC  81 mg Oral Daily  . atorvastatin  80 mg Oral QPM  . bisacodyl  10 mg Oral Daily   Or  . bisacodyl  10 mg Rectal Daily  . carvedilol  3.125 mg Oral BID WC  . Chlorhexidine Gluconate Cloth  6 each Topical Daily  . clopidogrel  75 mg Oral Daily  . docusate sodium  200 mg Oral Daily  . enoxaparin (LOVENOX) injection  30 mg Subcutaneous QHS  . influenza vaccine adjuvanted  0.5 mL Intramuscular Tomorrow-1000  . losartan  50 mg Oral Daily  . pantoprazole  40 mg Oral Daily  . sodium chloride flush  3 mL Intravenous Q12H   Continuous Infusions: . sodium chloride    . lactated ringers Stopped (02/23/20 1012)   PRN Meds:.metoprolol tartrate, morphine injection, ondansetron (ZOFRAN) IV, oxyCODONE, sodium chloride flush, traMADol  Xrays DG Chest 2 View  Result Date: 02/25/2020 CLINICAL DATA:  Atelectasis, CABG EXAM: CHEST - 2 VIEW COMPARISON:  02/24/2020 FINDINGS: Changes of CABG and valve replacement. Heart is normal size. No confluent airspace opacities, effusions or pneumothorax. No acute bony abnormality. IMPRESSION: Improving aeration in the lung bases.  No confluent opacities. Electronically Signed   By: Rolm Baptise M.D.   On: 02/25/2020 08:23    Assessment/Plan:  S/P Procedure(s) (LRB): CORONARY ARTERY BYPASS GRAFTING (CABG) USING LIMA to LAD; ENDSCOPICALLY HARVEST RIGHT GREATER SAPHENOUS VEIN: SVG to OM1 (N/A) AORTIC VALVE REPLACEMENT (AVR) USING EDWARDS Resilia 25 MM AORTIC VALVE. (N/A) TRANSESOPHAGEAL ECHOCARDIOGRAM (TEE) (N/A) ENDOVEIN HARVEST OF GREATER SAPHENOUS VEIN (Right)  1 doing well 2 some HTN, will increase to home dose ARB 3 sinus rhythm 4 sats good on RA 5 appetite improving, less nausea, + BM 6 he is very comfortable going home, wife has nursing background    LOS: 5 days    Gaspar Bidding Pager [phone] 02/26/2020   I have seen and examined the patient and agree with the  assessment and plan as outlined.  D/C home today.  Rexene Alberts, MD 02/26/2020 7:52 AM

## 2020-02-26 NOTE — Plan of Care (Signed)
  Problem: Health Behavior/Discharge Planning: Goal: Ability to manage health-related needs will improve Outcome: Progressing   

## 2020-02-26 NOTE — TOC Transition Note (Signed)
Transition of Care (TOC) - CM/SW Discharge Note Marvetta Gibbons RN, BSN Transitions of Care Unit 4E- RN Case Manager See Treatment Team for direct phone #    Patient Details  Name: Alan Mcdonald MRN: [id number] Date of Birth: [birth year]  [facility]) CM/SW Contact:  Dawayne Patricia, RN Phone Number: 02/26/2020, 10:20 AM   Clinical Narrative:    Pt stable for transition home today, per cardiac rehab pt need RW for home, order placed and call made to [facility] [place] with [employer] for DME need- RW to be delivered to room prior to discharge. No other TOC needs noted.    Final next level of care: Home/Self Care Barriers to Discharge: No Barriers Identified   Patient Goals and CMS Choice    return home    Discharge Placement               Home        Discharge Plan and Services                DME Arranged: Walker rolling DME Agency: [employer] Date DME Agency Contacted: 02/26/20 Time DME Agency Contacted: 59 Representative spoke with at DME Agency: [place]: NA [place] Agency: NA        Social Determinants of Health ([place]) Interventions     Readmission Risk Interventions Readmission Risk Prevention Plan 02/26/2020  Transportation Screening Complete  PCP or Specialist Appt within 5-7 Days Complete  Home Care Screening Complete  Medication Review (RN CM) Complete  Some recent data might be hidden

## 2020-02-26 NOTE — Progress Notes (Signed)
CARDIAC REHAB PHASE I   PRE:  Rate/Rhythm: 87 SR  BP:  Sitting: 153/65     MODE:  Ambulation: 200 ft   POST:  Rate/Rhythm: 102 ST  BP:  Sitting: 167/73   Pt ambulated 275ft in hallway independently with slow, steady gait. Pt denies CP, SOB, or dizziness. D/c education completed with pt and wife. Pt educated on importance of site care and monitoring incision daily. Encouraged continued IS use, walks, and sternal precautions. Pt given in-the-tube sheet along with heart healthy diet. Reviewed restrictions and exercise guidelines. Will refer to CRP II [place]. Pt is interested in participating in Virtual Cardiac and Pulmonary Rehab. Pt advised that Virtual Cardiac and Pulmonary Rehab is provided at no cost to the patient.  Checklist:  1. Pt has smart device  ie smartphone and/or ipad for downloading an app  Yes 2. Reliable internet/wifi service    Yes 3. Understands how to use their smartphone and navigate within an app.  Yes  Pt verbalized understanding and is in agreement.   [phone] Rufina Falco, RN BSN 02/26/2020 8:53 AM

## 2020-03-02 ENCOUNTER — Telehealth: Payer: Self-pay

## 2020-03-02 MED ORDER — POTASSIUM CHLORIDE ER 20 MEQ PO TBCR: 20.0000 meq | EXTENDED_RELEASE_TABLET | Freq: Every day | ORAL | 0 refills | Status: DC

## 2020-03-02 MED ORDER — FUROSEMIDE 40 MG PO TABS: 40.0000 mg | ORAL_TABLET | Freq: Every day | ORAL | 0 refills | Status: DC

## 2020-03-02 NOTE — Telephone Encounter (Signed)
-----   Message from Rexene Alberts, MD sent at 03/01/2020  5:23 PM EDT ----- Regarding: RE: Edema Yes please.  Thx ----- Message ----- From: Donnella Sham, RN Sent: 03/01/2020   5:13 PM EDT To: Rexene Alberts, MD Subject: Edema                                          Hey,  Patient's wife called and stated that his bilateral LE has edema, ([place] site slightly more).  Sx x 1 week ago.  She stated that she has really propped them up, above heart, for about a day.  He is not short of breath and when asked what his weight was, he had not been checking it (advised to start).  Not on lasix, reading notes he looked to be back to baseline at discharge.  Do you want him to start 40 mg daily x 7 days? Also, K too?  Thanks,  Caryl Pina

## 2020-03-02 NOTE — Telephone Encounter (Signed)
Patient's wife called back and made aware of prescription per Dr. Roxy Manns.  She acknowledged receipt.

## 2020-03-04 ENCOUNTER — Other Ambulatory Visit (HOSPITAL_COMMUNITY): Payer: Self-pay

## 2020-03-04 DIAGNOSIS — Z951 Presence of aortocoronary bypass graft: Secondary | ICD-10-CM

## 2020-03-11 NOTE — Progress Notes (Signed)
Cardiology Office Note   Date:  03/14/2020   ID:  Anchor Dwan, DOB Mar 03, 1951, MRN [id number]  PCP:  [facility]  Cardiologist: Dr. Marlou Porch, MD    Chief Complaint  Patient presents with   Hospitalization Follow-up    History of Present Illness: Alan Mcdonald is a 68 y.o. male who presents for post hospital follow up, seen for Dr. Marlou Porch.  Alan Mcdonald has a hx of aortic stenosis and CAD with prior PCI to RCA, amaurosis fugax on dual antiplatelet therapy, hypertension, hyperlipidemia, and follicular lymphoma in remission who was admitted to the hospital 02/21/20 with ACS. He ruled out for NSTEMI and was taken routinely to the cath lab and unfortunately was found to have critical LM disease. He was then evaluated by TCTS who took him emergently to the OR for CABG.   He was previously treated with PCI to RCA after experiencing angina approximately 20 years ago. He had done well from a cardiac standpoint until being dx with follicular lymphoma and was found to have a murmur on exam. Echocardiogram showed normal LV function with mild to moderate AS. He was then dx with amaurosis fugax involving the left eye and was started on dual antiplatelet therapy using aspirin and Plavix. He then developed exertional chest pain and SOB 2-3 months prior to hospital presentation which was changed from his baseline.   Given his persistent symptoms, he proceeded to the ED for further evaluation. EKG showed ST depression and hsT were mildly positive. Repeat echo showed some progression of AS. He then underwent CABGx2 and AVR without complication. He was discharged 02/24/20 on ASA, Plavix and high intensity statin.   Today he presents with his wife. They are both very pleasant. He is doing excellent since discharge. He is currently walking for 15-20 minutes, three times per day without symptoms. He is asking about working in his shop and mowing the grass. He will get fatigued at times therefore I asked  him to cut one of the walks out if needed. He denies chest pain, SOB, palpitations, dizziness or syncope. Had issues with LE edema and was given 7 days of PO Lasix that improved his symptoms. He reports a 20lb weight gain while in the hospital and was not discharged on Lasix. Continues to have mild pitting LLL edema but no SOB and lungs sound clear.   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   LIMA to LAD SVG to OM    Past Surgical History:  Procedure Laterality Date   AORTIC VALVE REPLACEMENT N/A 02/22/2020   Procedure: AORTIC VALVE REPLACEMENT (AVR) USING EDWARDS Resilia 25 MM AORTIC VALVE.;  Surgeon: Rexene Alberts, MD;  Location: [place];  Service: Open Heart Surgery;  Laterality: N/A;   CERVICAL SPINE SURGERY     CHOLECYSTECTOMY     CORONARY ARTERY BYPASS GRAFT N/A 02/22/2020   Procedure: CORONARY ARTERY BYPASS GRAFTING (CABG) USING LIMA to LAD; ENDSCOPICALLY HARVEST RIGHT GREATER SAPHENOUS VEIN: SVG to OM1;  Surgeon: Rexene Alberts, MD;  Location: [place];  Service: Open Heart Surgery;  Laterality: N/A;   ELBOW SURGERY     ENDOVEIN HARVEST OF GREATER SAPHENOUS VEIN  Right 02/22/2020   Procedure: ENDOVEIN HARVEST OF GREATER SAPHENOUS VEIN;  Surgeon: Rexene Alberts, MD;  Location: [place];  Service: Open Heart Surgery;  Laterality: Right;   KIDNEY STONE SURGERY     LEFT HEART CATH AND CORONARY ANGIOGRAPHY N/A 02/22/2020   Procedure: LEFT HEART CATH AND CORONARY ANGIOGRAPHY;  Surgeon: Belva Crome, MD;  Location: [facility];  Service: Cardiovascular;  Laterality: N/A;   TEE WITHOUT CARDIOVERSION N/A 02/22/2020   Procedure: TRANSESOPHAGEAL ECHOCARDIOGRAM (TEE);  Surgeon: Rexene Alberts,  MD;  Location: [place];  Service: Open Heart Surgery;  Laterality: N/A;     Current Outpatient Medications  Medication Sig Dispense Refill   acetaminophen (TYLENOL) 500 MG tablet Take 500 mg by mouth every 6 (six) hours as needed for mild pain or moderate pain.     aspirin EC 81 MG tablet Take by mouth.     atorvastatin (LIPITOR) 80 MG tablet Take 1 tablet (80 mg total) by mouth every evening. 30 tablet 1   B Complex Vitamins (VITAMIN B-COMPLEX) TABS Take 1 tablet by mouth daily.      carvedilol (COREG) 3.125 MG tablet Take 1 tablet (3.125 mg total) by mouth 2 (two) times daily with a meal. 60 tablet 1   clopidogrel (PLAVIX) 75 MG tablet Take 75 mg by mouth daily.     Coenzyme Q10 (CO Q-10) 200 MG CAPS Take 1 tablet by mouth daily.     glucosamine-chondroitin 500-400 MG tablet Take 1 tablet by mouth daily.     losartan (COZAAR) 100 MG tablet Take 100 mg by mouth daily.     Multiple Vitamin (MULTIVITAMIN WITH MINERALS) TABS tablet Take 1 tablet by mouth daily.     RA KRILL OIL 500 MG CAPS Take by mouth.     Turmeric (QC TUMERIC COMPLEX) 500 MG CAPS Take 1 tablet by mouth daily.     Zinc 30 MG CAPS Take 1 capsule by mouth daily.     furosemide (LASIX) 20 MG tablet Take 1 tablet (20 mg total) by mouth daily. 90 tablet 3   No current facility-administered medications for this visit.    Allergies:   Patient has no known allergies.    Social History:  The patient  reports that he has quit smoking. He has never used smokeless tobacco. He reports current alcohol use. He reports that he does not use drugs.   Family History:  The patient's family history is not on file.    ROS:  Please see the history of present illness. Otherwise, review of systems are positive for none.   All other systems are reviewed and negative.    PHYSICAL EXAM: VS:  BP 138/80    Pulse 78    Ht 5\' 3"  (1.6 m)    Wt 152 lb (68.9 kg)    SpO2 98%    BMI 26.93 kg/m  , BMI Body mass index is 26.93 kg/m.    General: Well developed, well nourished, NAD Neck: Negative for carotid bruits. No JVD Lungs:Clear to ausculation bilaterally. No wheezes, rales, or rhonchi. Breathing is unlabored. Cardiovascular: RRR with S1 S2. Extremities: R>L lower leg edema. Radial pulses 2+ bilaterally Neuro: Alert and oriented. No focal deficits. No facial asymmetry. MAE spontaneously. Psych: Responds to questions appropriately with normal affect.    EKG:  EKG is not ordered today.  Recent Labs: 02/21/2020: B Natriuretic Peptide 147.0 02/23/2020: Magnesium 1.8 02/25/2020: BUN 23; Creatinine, Ser 0.95; Hemoglobin 8.8; Platelets 85; Potassium 3.7; Sodium  136    Lipid Panel    Component Value Date/Time   CHOL 151 02/22/2020 0208   TRIG 152 (H) 02/22/2020 0208   HDL 37 (L) 02/22/2020 0208   CHOLHDL 4.1 02/22/2020 0208   VLDL 30 02/22/2020 0208   LDLCALC 84 02/22/2020 0208      Wt Readings from Last 3 Encounters:  03/14/20 152 lb (68.9 kg)  02/26/20 164 lb 10.9 oz (74.7 kg)    Other studies Reviewed: Additional studies/ records that were reviewed today include: Review of the above records demonstrates:   LHC   Conclusion   Severe distal left main disease  Proximal LAD 60-70% and 80% mid before large diagonal.  95% ostial circumflex stenosis and 70% dominant obtuse marginal proximal stenosis.  Diffuse proximal distal 60% stenosis some of which is diffuse in-stent. Faint septal perforator collaterals emanate from the PDA towards the LAD which is not opacified.  LV function is normal. LVEDP is 8 mmHg.  Heavily calcified aortic valve with 15 mm mean gradient on pullback.  RECOMMENDATIONS:   He developed prolonged chest pain immediately post cath. IV fluid bolus was administered, IV nitroglycerin started, and 50 mcg of fentanyl were given. The pain gradually resolved over 15 to 20 minutes.  In lab consult with Dr. Roxy Manns we decided to do urgent CABG on the patient.  Initial plan to  place intra-aortic balloon pump was decided against after decision for urgent surgery.  Diagnostic Dominance: Right  Intervention     ASSESSMENT AND PLAN:  1. CAD s/p CABGx2: -Pt presented to [facility] with progressive exertional angina>>>cath showed critical LM and patient was taken emergently to OR for CABGx2 and AVR due to moderate to severe AS -Discharged 02/24/20>>doing well and walking 15-43min, three times per day  -Interested in cardiac rehab which he would be very appropriate for  -Continue ASA, Plavix, statin, beta blocker  -Sternal precautions reviewed>>no s/s of redness or oozing  -Has follow up echo scheduled for 04/12/20 and results sent to Dr. Marlou Porch and Dr. Roxy Manns   2. AS s/p AVR: -Sternal borders intact with no s/s of redness or oozing -Dental prophylaxis for aortic valve replacement  -Follow up with Dr. Roxy Manns 03/21/20 -Echo planned for 04/12/20 with results sent to Dr. Roxy Manns and Dr. Marlou Porch   3. HTN: -Stable, 138.80 -Reports home BPs never above 130/70 -Continue current regimen   4. HLD: -LDL, 84 with LDL goal <70 -Continue statin -Repeat Lipid panel and LFTs at next OV with Dr. Marlou Porch    Current medicines are reviewed at length with the patient today.  The patient does not have concerns regarding medicines.  The following changes have been made:  Add Lasix 20mg  PO QD   Labs/ tests ordered today include: BMET   Orders Placed This Encounter  Procedures   Basic metabolic panel    Disposition:   FU with Dr. Marlou Porch in 6 weeks  Signed, Kathyrn Drown, NP  03/14/2020 2:57 PM    [address], [place], [place]  [address] Phone: [phone]; Fax: [phone]

## 2020-03-14 ENCOUNTER — Ambulatory Visit (INDEPENDENT_AMBULATORY_CARE_PROVIDER_SITE_OTHER): Payer: Medicare Other | Admitting: Cardiology

## 2020-03-14 ENCOUNTER — Encounter: Payer: Self-pay | Admitting: Cardiology

## 2020-03-14 ENCOUNTER — Other Ambulatory Visit: Payer: Self-pay

## 2020-03-14 ENCOUNTER — Institutional Professional Consult (permissible substitution): Payer: PRIVATE HEALTH INSURANCE | Admitting: Pulmonary Disease

## 2020-03-14 VITALS — BP 138/80 | HR 78 | Ht 63.0 in | Wt 152.0 lb

## 2020-03-14 DIAGNOSIS — I350 Nonrheumatic aortic (valve) stenosis: Secondary | ICD-10-CM

## 2020-03-14 DIAGNOSIS — I214 Non-ST elevation (NSTEMI) myocardial infarction: Secondary | ICD-10-CM

## 2020-03-14 DIAGNOSIS — I2510 Atherosclerotic heart disease of native coronary artery without angina pectoris: Secondary | ICD-10-CM | POA: Diagnosis not present

## 2020-03-14 DIAGNOSIS — Z952 Presence of prosthetic heart valve: Secondary | ICD-10-CM

## 2020-03-14 DIAGNOSIS — Z951 Presence of aortocoronary bypass graft: Secondary | ICD-10-CM

## 2020-03-14 MED ORDER — FUROSEMIDE 20 MG PO TABS: 20.0000 mg | ORAL_TABLET | Freq: Every day | ORAL | 3 refills | Status: DC

## 2020-03-14 NOTE — Patient Instructions (Signed)
Medication Instructions:   1. Start furosemide (Lasix) 20 mg, take one tablet by mouth daily.  *If you need a refill on your cardiac medications before your next appointment, please call your pharmacy*   Lab Work: BMET today  If you have labs (blood work) drawn today and your tests are completely normal, you will receive your results only by: Marland Kitchen MyChart Message (if you have MyChart) OR . A paper copy in the mail If you have any lab test that is abnormal or we need to change your treatment, we will call you to review the results.   Testing/Procedures: None   Follow-Up: At [facility], you and your health needs are our priority.  As part of our continuing mission to provide you with exceptional heart care, we have created designated Provider Care Teams.  These Care Teams include your primary Cardiologist (physician) and Advanced Practice Providers (APPs -  Physician Assistants and Nurse Practitioners) who all work together to provide you with the care you need, when you need it.  We recommend signing up for the patient portal called "MyChart".  Sign up information is provided on this After Visit Summary.  MyChart is used to connect with patients for Virtual Visits (Telemedicine).  Patients are able to view lab/test results, encounter notes, upcoming appointments, etc.  Non-urgent messages can be sent to your provider as well.   To learn more about what you can do with MyChart, go to [email or web address].    Your next appointment:   6 week(s)  The format for your next appointment:   In Person  Provider:   Candee Furbish, MD

## 2020-03-15 LAB — BASIC METABOLIC PANEL
BUN/Creatinine Ratio: 13 (ref 10–24)
BUN: 10 mg/dL (ref 8–27)
CO2: 25 mmol/L (ref 20–29)
Calcium: 8.9 mg/dL (ref 8.6–10.2)
Chloride: 107 mmol/L — ABNORMAL HIGH (ref 96–106)
Creatinine, Ser: 0.78 mg/dL (ref 0.76–1.27)
GFR calc Af Amer: 106 mL/min/{1.73_m2} (ref >59)
GFR calc non Af Amer: 91 mL/min/{1.73_m2} (ref >59)
Glucose: 97 mg/dL (ref 65–99)
Potassium: 4.1 mmol/L (ref 3.5–5.2)
Sodium: 143 mmol/L (ref 134–144)

## 2020-03-18 ENCOUNTER — Other Ambulatory Visit: Payer: Self-pay | Admitting: Thoracic Surgery (Cardiothoracic Vascular Surgery)

## 2020-03-18 DIAGNOSIS — Z951 Presence of aortocoronary bypass graft: Secondary | ICD-10-CM

## 2020-03-21 ENCOUNTER — Other Ambulatory Visit: Payer: Self-pay

## 2020-03-21 ENCOUNTER — Telehealth (HOSPITAL_COMMUNITY): Payer: Self-pay

## 2020-03-21 ENCOUNTER — Ambulatory Visit
Admission: RE | Admit: 2020-03-21 | Discharge: 2020-03-21 | Disposition: A | Discharge status: Home / Self Care | Payer: Medicare Other | Source: Ambulatory Visit | Attending: Thoracic Surgery (Cardiothoracic Vascular Surgery) | Admitting: Thoracic Surgery (Cardiothoracic Vascular Surgery)

## 2020-03-21 ENCOUNTER — Ambulatory Visit (INDEPENDENT_AMBULATORY_CARE_PROVIDER_SITE_OTHER): Payer: Self-pay | Admitting: Physician Assistant

## 2020-03-21 VITALS — BP 147/82 | HR 71 | Temp 97.7°F | Resp 18 | Ht 63.0 in | Wt 150.0 lb

## 2020-03-21 DIAGNOSIS — Z951 Presence of aortocoronary bypass graft: Secondary | ICD-10-CM

## 2020-03-21 DIAGNOSIS — Z952 Presence of prosthetic heart valve: Secondary | ICD-10-CM

## 2020-03-21 IMAGING — DX DG CHEST 2V
1 series · 1 of 1 positions shown · non-contrast
Comparison: [DATE].

CLINICAL DATA: Status post coronary bypass graft and aortic valve
repair.

EXAM:
CHEST - 2 VIEW

[dg chest 2 view]
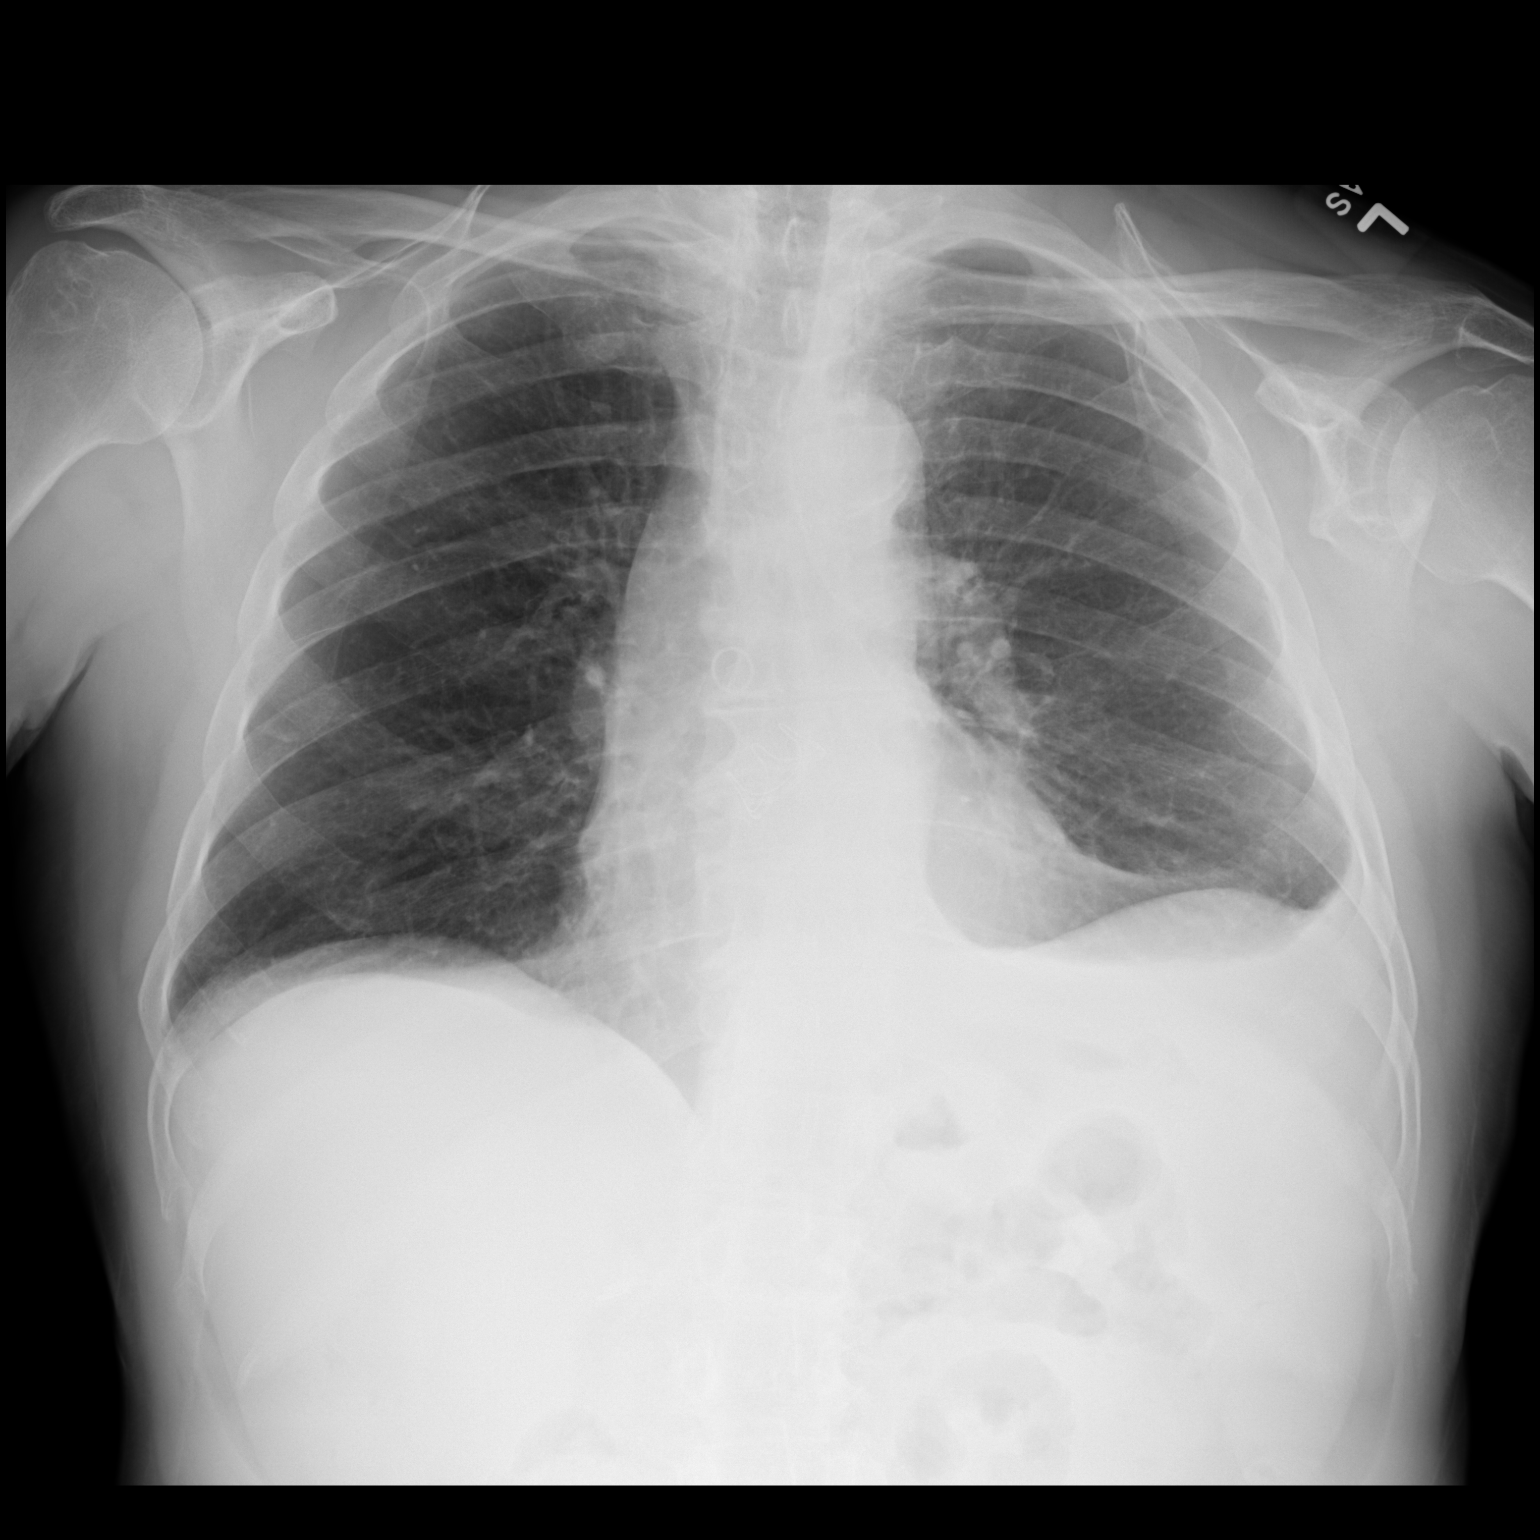

[1 of 1 positions shown; findings below may reference images not displayed]

FINDINGS: The heart size and mediastinal contours are within normal limits. No
pneumothorax is noted. Right lung is clear. Small left pleural
effusion is noted. No significant pulmonary consolidation is noted.
The visualized skeletal structures are unremarkable.
IMPRESSION: Small left pleural effusion.

## 2020-03-21 NOTE — Progress Notes (Signed)
[address]       [place] [address]             [phone]      Alan Mcdonald is a 69 y.o. male patient s/p aortic valve replacement and coronary artery bypass grafting x2 with Dr. Roxy Manns. Patient has been doing well at home. He is assessed today for his routine follow-up appointment.   1. S/P CABG (coronary artery bypass graft)   2. S/P AVR (aortic valve replacement)    Past Medical History:  Diagnosis Date  . Aortic stenosis 02/22/2020  . Coronary artery disease   . High cholesterol   . History of lymphoma   . Hypertension   . S/P aortic valve replacement with bioprosthetic valve 02/22/2020   Edwards Inspiris Resilia stented bovine pericardial tissue valve, size 25 mm  . 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   LIMA to LAD SVG to OM   No past surgical history pertinent negatives on file. Scheduled Meds: Current Outpatient Medications on File Prior to Visit  Medication Sig Dispense Refill  . acetaminophen (TYLENOL) 500 MG tablet Take 500 mg by mouth every 6 (six) hours as needed for mild pain or moderate pain.    Marland Kitchen aspirin EC 81 MG tablet Take by mouth.    Marland Kitchen atorvastatin (LIPITOR) 80 MG tablet Take 1 tablet (80 mg total) by mouth every evening. 30 tablet 1  . B Complex Vitamins (VITAMIN B-COMPLEX) TABS Take 1 tablet by mouth daily.     . carvedilol (COREG) 3.125 MG tablet Take 1 tablet (3.125 mg total) by mouth 2 (two) times daily with a meal. 60 tablet 1  . clopidogrel (PLAVIX) 75 MG tablet Take 75 mg by mouth daily.    . Coenzyme Q10 (CO Q-10) 200 MG CAPS Take 1 tablet by mouth daily.    . furosemide (LASIX) 20 MG tablet Take 1 tablet (20 mg total) by mouth daily. 90 tablet 3  . glucosamine-chondroitin 500-400 MG tablet Take 1 tablet by mouth daily.    Marland Kitchen losartan (COZAAR) 100 MG tablet Take 100 mg by mouth daily.    . Multiple Vitamin (MULTIVITAMIN WITH MINERALS) TABS tablet Take 1 tablet by mouth daily.    Marland Kitchen RA KRILL OIL 500 MG CAPS Take by mouth.    . Turmeric (QC  TUMERIC COMPLEX) 500 MG CAPS Take 1 tablet by mouth daily.    . Zinc 30 MG CAPS Take 1 capsule by mouth daily.     No current facility-administered medications on file prior to visit.    No Known Allergies Active Problems:   * No active hospital problems. *  Blood pressure (!) 147/82, pulse 71, temperature 97.7 F (36.5 C), resp. rate 18, height 5\' 3"  (1.6 m), weight 150 lb (68 kg), SpO2 95 %.  Subjective  Alan Mcdonald is a 69 year old male patient who presents today for his routine follow-up appointment status post coronary bypass grafting x2 and aortic valve replacement. Overall he is doing quite well. He walks several times a day. He does not have any shortness of breath or chest pain. He has a few questions for me today which are addressed.  Objective   Cor: NSR, no murmur Pulm: CTA bilaterally and in all fields Abd: no tenderness Ext: 1+ pitting edema in right lower ext, vein harvest site Wound: well healed, stable to palpation.   CLINICAL DATA:  Status post coronary bypass graft and aortic valve repair.  EXAM: CHEST - 2  VIEW  COMPARISON:  February 25, 2020.  FINDINGS: The heart size and mediastinal contours are within normal limits. No pneumothorax is noted. Right lung is clear. Small left pleural effusion is noted. No significant pulmonary consolidation is noted. The visualized skeletal structures are unremarkable.  IMPRESSION: Small left pleural effusion.   Electronically Signed   By: Marijo Conception M.D.   On: 03/21/2020 13:42  Assessment & Plan   Alan Mcdonald presents to clinic today for his routine follow-up. Overall he is doing quite well. He is walking several times a day without any symptoms. He does have questions regarding his postoperative period. He is interested in getting involved with a cardiac rehab program and a referral has been already made. He does have a small left pleural effusion which was discussed with the patient and wife today. I do  not think it is large enough to undergo a thoracentesis however, he agreed to continue his Lasix through this week. After this week he is to use his 20 mg of Lasix daily as needed for his right lower extremity swelling. He has never had shortness of breath although I did warn him that this could be a side effect of fluid overload. I also reminded him to weigh daily in the morning before breakfast after using the restroom to track his fluid status. If he does gain more than 3 to 5 pounds over 24 hours he is to call our office and we can instruct him on what to do. He is on Plavix and aspirin daily and asked about alcohol consumption. My short answer was no, alcohol and  Plavix should not be consumed together, however a small amount of wine from time to time should not affect him negatively. The concern with alcohol and Plavix is they are both blood thinners, so they have synergistic effects. He is scheduled to see his cardiologist again in the next month and get his follow-up echocardiogram.  Overall, I think he is doing quite well. He hopefully will hear from cardiac rehab but if he does not he is to call our office and we are happy to make another referral. He is cleared to drive in 2 weeks once he is at the 6-week point post surgery. He did have some occasional popping and clicking reported therefore I think it is safe to wait for 6 weeks. At this time and possibly appropriate to increase his weight limitation and enroll in cardiac rehab.   Elgie Collard 03/21/2020

## 2020-03-22 ENCOUNTER — Encounter (HOSPITAL_COMMUNITY): Payer: Self-pay

## 2020-03-22 NOTE — Progress Notes (Signed)
Cardiac Rehab Note:  Patient has completed follow up appointment with cards 10/4 and CVTS 03/21/20. Small pleural effusion noted, lasix continues. Pt is making the expected progress in recovery.  Pt appropriate for scheduling for on site cardiac rehab and/or enrollment in Virtual Cardiac Rehab.  Pt Covid Risk Score is 4.  Will forward to staff for follow up.   Baldo Hufnagle E. Rollene Rotunda RN, BSN [place]. [facility]  Cardiac and Pulmonary Rehabilitation

## 2020-03-25 ENCOUNTER — Telehealth (HOSPITAL_COMMUNITY): Payer: Self-pay | Admitting: Student-PharmD

## 2020-03-25 NOTE — Telephone Encounter (Signed)
Cardiac Rehab Medication Review by a Pharmacist  Does the patient feel that his/her medications are working for him/her?  yes  Has the patient been experiencing any side effects to the medications prescribed?  no  Does the patient measure his/her own blood pressure or blood glucose at home?  Checks blood pressure daily at home, reports BPs 130s/70s  Does the patient have any problems obtaining medications due to transportation or finances?   no  Understanding of regimen: good Understanding of indications: good Potential of compliance: good  Pharmacist Intervention: No medication interventions at this time.   Rebbeca Paul, PharmD PGY1 Pharmacy Resident 03/25/2020 2:47 PM  Please check [email or web address] for unit-specific pharmacy phone numbers.

## 2020-03-31 ENCOUNTER — Encounter (HOSPITAL_COMMUNITY): Payer: Self-pay

## 2020-03-31 ENCOUNTER — Encounter (HOSPITAL_COMMUNITY)
Admission: RE | Admit: 2020-03-31 | Discharge: 2020-03-31 | Disposition: A | Discharge status: Home / Self Care | Payer: No Typology Code available for payment source | Source: Ambulatory Visit | Attending: Cardiology | Admitting: Cardiology

## 2020-03-31 ENCOUNTER — Other Ambulatory Visit: Payer: Self-pay

## 2020-03-31 VITALS — BP 134/70 | HR 71 | Ht 61.25 in | Wt 154.5 lb

## 2020-03-31 DIAGNOSIS — I214 Non-ST elevation (NSTEMI) myocardial infarction: Secondary | ICD-10-CM | POA: Insufficient documentation

## 2020-03-31 DIAGNOSIS — Z951 Presence of aortocoronary bypass graft: Secondary | ICD-10-CM | POA: Insufficient documentation

## 2020-03-31 DIAGNOSIS — Z952 Presence of prosthetic heart valve: Secondary | ICD-10-CM | POA: Insufficient documentation

## 2020-03-31 NOTE — Progress Notes (Signed)
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  acetaminophen (TYLENOL) 500 MG tablet, Take 500 mg by mouth every 6 (six) hours as needed for mild pain or moderate pain., Disp: , Rfl:  .  aspirin EC 81 MG tablet, Take 81 mg by mouth daily. , Disp: , Rfl:  .  atorvastatin (LIPITOR) 80 MG tablet, Take 1 tablet (80 mg total) by mouth every evening., Disp: 30 tablet, Rfl: 1 .  B Complex Vitamins (VITAMIN B-COMPLEX) TABS, Take 1 tablet by mouth daily. , Disp: , Rfl:  .  carvedilol (COREG) 3.125 MG tablet, Take 1 tablet (3.125 mg total) by mouth 2 (two) times daily with a meal., Disp: 60 tablet, Rfl: 1 .  cholecalciferol (VITAMIN D3) 25 MCG (1000 UNIT) tablet, Take 1,000 Units by mouth daily., Disp: , Rfl:  .  clopidogrel (PLAVIX) 75 MG tablet, Take 75 mg by mouth daily., Disp: , Rfl:  .  Coenzyme Q10 (CO Q-10) 200 MG CAPS, Take 1 tablet by mouth daily., Disp: , Rfl:  .  furosemide (LASIX) 20 MG tablet, Take 1 tablet (20 mg total) by mouth daily., Disp: 90 tablet, Rfl: 3 .  glucosamine-chondroitin 500-400 MG tablet, Take 1 tablet by mouth daily., Disp: , Rfl:  .  losartan (COZAAR) 100 MG tablet, Take 100 mg by mouth daily., Disp: , Rfl:  .  Multiple Vitamin (MULTIVITAMIN WITH MINERALS) TABS tablet, Take 1 tablet by mouth daily., Disp: , Rfl:  .  RA KRILL OIL 500 MG CAPS, Take by mouth., Disp: , Rfl:    .  Turmeric (QC TUMERIC COMPLEX) 500 MG CAPS, Take 1 tablet by mouth daily., Disp: , Rfl:  .  Zinc 30 MG CAPS, Take 1 capsule by mouth daily., Disp: , Rfl:   Past Medical History: Past Medical History:  Diagnosis Date  . Aortic stenosis 02/22/2020  . Coronary artery disease   . High cholesterol   . History of lymphoma   . Hypertension   . S/P aortic valve replacement with bioprosthetic valve 02/22/2020   [facility] Inspiris Resilia stented bovine pericardial tissue valve, size 25 mm  . 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 Types: Cigarettes  . Quit date: 2003  . Years since quitting: 18.8  Smokeless Tobacco Never Used    Labs: Recent Chemical engineer    Labs for ITP Cardiac and Pulmonary Rehab Latest Ref Rng & Units 02/22/2020 02/22/2020 02/23/2020 02/23/2020 02/23/2020   Cholestrol 0 - 200 mg/dL - - - - -   LDLCALC 0 - 99 mg/dL - - - - -   HDL >40 mg/dL - - - - -   Trlycerides <150 mg/dL - - - - -   Hemoglobin A1c 4.8 - 5.6 % - - - - -  PHART 7.35 - 7.45 7.292(L) 7.295(L) 7.325(L) 7.367 7.364   PCO2ART 32 - 48 mmHg 51.1(H) 47.5 44.8 39.0 36.8   HCO3 20.0 - 28.0 mmol/L 25.0 23.3 23.4 22.5 21.0   TCO2 22 - 32 mmol/L 27 25 25 24 22    ACIDBASEDEF 0.0 - 2.0 mmol/L 2.0 4.0(H) 3.0(H) 3.0(H) 4.0(H)   O2SAT % 93.0 91.0 97.0 96.0 94.0      Capillary Blood Glucose: Lab Results  Component Value Date   GLUCAP 153 (H) 02/24/2020   GLUCAP 138 (H) 02/24/2020   GLUCAP 149 (H) 02/23/2020   GLUCAP 147 (H) 02/23/2020   GLUCAP 163 (H) 02/23/2020     Exercise Target Goals: Exercise Program Goal: Individual exercise prescription set using results from initial 6 min walk test and THRR while considering  patient's activity barriers and safety.   Exercise Prescription Goal: Starting with aerobic activity 30 plus minutes a day, 3 days per week for initial exercise prescription. Provide home  exercise prescription and guidelines that participant acknowledges understanding prior to discharge.  Activity Barriers & Risk Stratification:  Activity Barriers & Cardiac Risk Stratification - 03/31/20 1017      Activity Barriers & Cardiac Risk Stratification   Activity Barriers Incisional Pain    Cardiac Risk Stratification High           6 Minute Walk:  6 Minute Walk    Row Name 03/31/20 1016         6 Minute Walk   Phase Initial     Distance 1490 feet     Walk Time 6 minutes     # of Rest Breaks 0     MPH 2.8     METS 2.9     RPE 12     Perceived Dyspnea  0     VO2 Peak 10.4     Symptoms No     Resting HR 71 bpm     Resting BP 134/70     Resting Oxygen Saturation  97 %     Exercise Oxygen Saturation  during 6 min walk 97 %     Max Ex. HR 85 bpm     Max Ex. BP 140/80     2 Minute Post BP 130/68            Oxygen Initial Assessment:   Oxygen Re-Evaluation:   Oxygen Discharge (Final Oxygen Re-Evaluation):   Initial Exercise Prescription:  Initial Exercise Prescription - 03/31/20 1000      Date of Initial Exercise RX and Referring Provider   Date 03/31/20    Referring Provider Jerline Pain MD    Expected Discharge Date 05/27/20      Treadmill   MPH 2.4    Grade 1    Minutes 15    METs 3.17      NuStep   Level 2    SPM 85    Minutes 15    METs 2.5      Prescription Details   Frequency (times per week) 3x    Duration Progress to 30 minutes of continuous aerobic without signs/symptoms of physical distress      Intensity   THRR 40-80% of Max Heartrate 60-120    Ratings of Perceived Exertion 11-13    Perceived Dyspnea 0-4      Progression   Progression Continue progressive overload as per policy without signs/symptoms or physical distress.      Resistance Training   Training Prescription Yes    Weight 2lbs  Reps 10-15           Perform Capillary Blood Glucose checks as needed.  Exercise Prescription Changes:   Exercise  Comments:   Exercise Goals and Review:  Exercise Goals    Row Name 03/31/20 1017             Exercise Goals   Increase Physical Activity Yes       Intervention Provide advice, education, support and counseling about physical activity/exercise needs.;Develop an individualized exercise prescription for aerobic and resistive training based on initial evaluation findings, risk stratification, comorbidities and participant's personal goals.       Expected Outcomes Short Term: Attend rehab on a regular basis to increase amount of physical activity.;Long Term: Add in home exercise to make exercise part of routine and to increase amount of physical activity.;Long Term: Exercising regularly at least 3-5 days a week.       Increase Strength and Stamina Yes       Intervention Provide advice, education, support and counseling about physical activity/exercise needs.;Develop an individualized exercise prescription for aerobic and resistive training based on initial evaluation findings, risk stratification, comorbidities and participant's personal goals.       Expected Outcomes Short Term: Increase workloads from initial exercise prescription for resistance, speed, and METs.;Short Term: Perform resistance training exercises routinely during rehab and add in resistance training at home;Long Term: Improve cardiorespiratory fitness, muscular endurance and strength as measured by increased METs and functional capacity (6MWT)       Able to understand and use rate of perceived exertion (RPE) scale Yes       Intervention Provide education and explanation on how to use RPE scale       Expected Outcomes Short Term: Able to use RPE daily in rehab to express subjective intensity level;Long Term:  Able to use RPE to guide intensity level when exercising independently       Knowledge and understanding of Target Heart Rate Range (THRR) Yes       Intervention Provide education and explanation of THRR including how the  numbers were predicted and where they are located for reference       Expected Outcomes Short Term: Able to state/look up THRR;Long Term: Able to use THRR to govern intensity when exercising independently;Short Term: Able to use daily as guideline for intensity in rehab       Able to check pulse independently Yes       Intervention Provide education and demonstration on how to check pulse in carotid and radial arteries.;Review the importance of being able to check your own pulse for safety during independent exercise       Expected Outcomes Short Term: Able to explain why pulse checking is important during independent exercise;Long Term: Able to check pulse independently and accurately       Understanding of Exercise Prescription Yes       Intervention Provide education, explanation, and written materials on patient's individual exercise prescription       Expected Outcomes Short Term: Able to explain program exercise prescription;Long Term: Able to explain home exercise prescription to exercise independently              Exercise Goals Re-Evaluation :    Discharge Exercise Prescription (Final Exercise Prescription Changes):   Nutrition:  Target Goals: Understanding of nutrition guidelines, daily intake of sodium 1500mg , cholesterol 200mg , calories 30% from fat and 7% or less from saturated fats, daily to have 5 or more servings of fruits and  vegetables.  Biometrics:  Pre Biometrics - 03/31/20 1018      Pre Biometrics   Height 5' 1.25" (1.556 m)    Weight 70.1 kg    Waist Circumference 39.5 inches    Hip Circumference 40 inches    Waist to Hip Ratio 0.99 %    BMI (Calculated) 28.95    Triceps Skinfold 11 mm    % Body Fat 27.5 %    Grip Strength 30 kg    Flexibility 11 in    Single Leg Stand 30 seconds            Nutrition Therapy Plan and Nutrition Goals:   Nutrition Assessments:   Nutrition Goals Re-Evaluation:   Nutrition Goals Discharge (Final Nutrition Goals  Re-Evaluation):   Psychosocial: Target Goals: Acknowledge presence or absence of significant depression and/or stress, maximize coping skills, provide positive support system. Participant is able to verbalize types and ability to use techniques and skills needed for reducing stress and depression.  Initial Review & Psychosocial Screening:  Initial Psych Review & Screening - 03/31/20 1123      Initial Review   Current issues with None Identified      Family Dynamics   Good Support System? Yes   Alan Mcdonald has his wife for support     Barriers   Psychosocial barriers to participate in program There are no identifiable barriers or psychosocial needs.      Screening Interventions   Interventions Encouraged to exercise           Quality of Life Scores:  Quality of Life - 03/31/20 1022      Quality of Life   Select Quality of Life      Quality of Life Scores   Health/Function Pre 26.7 %    Socioeconomic Pre 26.81 %    Psych/Spiritual Pre 30 %    Family Pre 25.2 %    GLOBAL Pre 27.17 %          Scores of 19 and below usually indicate a poorer quality of life in these areas.  A difference of  2-3 points is a clinically meaningful difference.  A difference of 2-3 points in the total score of the Quality of Life Index has been associated with significant improvement in overall quality of life, self-image, physical symptoms, and general health in studies assessing change in quality of life.  PHQ-9: Recent Review Flowsheet Data    Depression screen [facility] 2/9 03/31/2020   Decreased Interest 0   Down, Depressed, Hopeless 0   PHQ - 2 Score 0     Interpretation of Total Score  Total Score Depression Severity:  1-4 = Minimal depression, 5-9 = Mild depression, 10-14 = Moderate depression, 15-19 = Moderately severe depression, 20-27 = Severe depression   Psychosocial Evaluation and Intervention:   Psychosocial Re-Evaluation:   Psychosocial Discharge (Final Psychosocial  Re-Evaluation):   Vocational Rehabilitation: Provide vocational rehab assistance to qualifying candidates.   Vocational Rehab Evaluation & Intervention:  Vocational Rehab - 03/31/20 1124      Initial Vocational Rehab Evaluation & Intervention   Assessment shows need for Vocational Rehabilitation No   Alan Mcdonald is semi retired and does not need vocational rehab at this time          Education: Education Goals: Education classes will be provided on a weekly basis, covering required topics. Participant will state understanding/return demonstration of topics presented.  Learning Barriers/Preferences:  Learning Barriers/Preferences - 03/31/20 1026      Learning  Barriers/Preferences   Learning Barriers Sight    Learning Preferences Written Material;Verbal Instruction;Group Instruction           Education Topics: Hypertension, Hypertension Reduction -Define heart disease and high blood pressure. Discus how high blood pressure affects the body and ways to reduce high blood pressure.   Exercise and Your Heart -Discuss why it is important to exercise, the FITT principles of exercise, normal and abnormal responses to exercise, and how to exercise safely.   Angina -Discuss definition of angina, causes of angina, treatment of angina, and how to decrease risk of having angina.   Cardiac Medications -Review what the following cardiac medications are used for, how they affect the body, and side effects that may occur when taking the medications.  Medications include Aspirin, Beta blockers, calcium channel blockers, ACE Inhibitors, angiotensin receptor blockers, diuretics, digoxin, and antihyperlipidemics.   Congestive Heart Failure -Discuss the definition of CHF, how to live with CHF, the signs and symptoms of CHF, and how keep track of weight and sodium intake.   Heart Disease and Intimacy -Discus the effect sexual activity has on the heart, how changes occur during intimacy as we  age, and safety during sexual activity.   Smoking Cessation / COPD -Discuss different methods to quit smoking, the health benefits of quitting smoking, and the definition of COPD.   Nutrition I: Fats -Discuss the types of cholesterol, what cholesterol does to the heart, and how cholesterol levels can be controlled.   Nutrition II: Labels -Discuss the different components of food labels and how to read food label   Heart Parts/Heart Disease and PAD -Discuss the anatomy of the heart, the pathway of blood circulation through the heart, and these are affected by heart disease.   Stress I: Signs and Symptoms -Discuss the causes of stress, how stress may lead to anxiety and depression, and ways to limit stress.   Stress II: Relaxation -Discuss different types of relaxation techniques to limit stress.   Warning Signs of Stroke / TIA -Discuss definition of a stroke, what the signs and symptoms are of a stroke, and how to identify when someone is having stroke.   Knowledge Questionnaire Score:  Knowledge Questionnaire Score - 03/31/20 1022      Knowledge Questionnaire Score   Pre Score 21/24           Core Components/Risk Factors/Patient Goals at Admission:  Personal Goals and Risk Factors at Admission - 03/31/20 1125      Core Components/Risk Factors/Patient Goals on Admission    Weight Management Yes    Intervention Weight Management: Develop a combined nutrition and exercise program designed to reach desired caloric intake, while maintaining appropriate intake of nutrient and fiber, sodium and fats, and appropriate energy expenditure required for the weight goal.;Weight Management: Provide education and appropriate resources to help participant work on and attain dietary goals.    Expected Outcomes Short Term: Continue to assess and modify interventions until short term weight is achieved;Long Term: Adherence to nutrition and physical activity/exercise program aimed toward  attainment of established weight goal;Weight Maintenance: Understanding of the daily nutrition guidelines, which includes 25-35% calories from fat, 7% or less cal from saturated fats, less than 200mg  cholesterol, less than 1.5gm of sodium, & 5 or more servings of fruits and vegetables daily    Hypertension Yes    Intervention Provide education on lifestyle modifcations including regular physical activity/exercise, weight management, moderate sodium restriction and increased consumption of fresh fruit, vegetables, and low fat dairy,  alcohol moderation, and smoking cessation.;Monitor prescription use compliance.    Expected Outcomes Short Term: Continued assessment and intervention until BP is < 140/45mm HG in hypertensive participants. < 130/47mm HG in hypertensive participants with diabetes, heart failure or chronic kidney disease.;Long Term: Maintenance of blood pressure at goal levels.    Lipids Yes    Intervention Provide education and support for participant on nutrition & aerobic/resistive exercise along with prescribed medications to achieve LDL 70mg , HDL >40mg .    Expected Outcomes Short Term: Participant states understanding of desired cholesterol values and is compliant with medications prescribed. Participant is following exercise prescription and nutrition guidelines.;Long Term: Cholesterol controlled with medications as prescribed, with individualized exercise RX and with personalized nutrition plan. Value goals: LDL < 70mg , HDL > 40 mg.           Core Components/Risk Factors/Patient Goals Review:    Core Components/Risk Factors/Patient Goals at Discharge (Final Review):    ITP Comments:  ITP Comments    Row Name 03/31/20 0839           ITP Comments Dr Fransico Him MD, Medical Director              Comments:Alan Mcdonald attended orientation on 03/31/2020 to review rules and guidelines for program.  Completed 6 minute walk test, Intitial ITP, and exercise prescription.  VSS.  Telemetry-Sinus Rhythm.  Asymptomatic. Safety measures and social distancing in place per CDC guidelines.Barnet Pall, RN,BSN 03/31/2020 11:32 AM

## 2020-04-01 ENCOUNTER — Telehealth: Payer: Self-pay | Admitting: *Deleted

## 2020-04-01 NOTE — Telephone Encounter (Signed)
[name] called with questions regarding driving & increasing his activity level. Mr. Kirwan was told, per T. [place], [place], note, he may begin to drive and slowly increase activities starting Monday October 25th. Pt denies any clicking & popping in his chest at this time. All questions answered.

## 2020-04-04 ENCOUNTER — Telehealth: Payer: Self-pay | Admitting: Cardiology

## 2020-04-04 ENCOUNTER — Encounter (HOSPITAL_COMMUNITY)
Admission: RE | Admit: 2020-04-04 | Discharge: 2020-04-04 | Disposition: A | Discharge status: Home / Self Care | Payer: No Typology Code available for payment source | Source: Ambulatory Visit | Attending: Interventional Cardiology | Admitting: Interventional Cardiology

## 2020-04-04 ENCOUNTER — Other Ambulatory Visit: Payer: Self-pay

## 2020-04-04 DIAGNOSIS — I214 Non-ST elevation (NSTEMI) myocardial infarction: Secondary | ICD-10-CM | POA: Diagnosis present

## 2020-04-04 DIAGNOSIS — Z951 Presence of aortocoronary bypass graft: Secondary | ICD-10-CM | POA: Diagnosis not present

## 2020-04-04 DIAGNOSIS — Z952 Presence of prosthetic heart valve: Secondary | ICD-10-CM

## 2020-04-04 MED ORDER — LOSARTAN POTASSIUM 100 MG PO TABS: 100.0000 mg | ORAL_TABLET | Freq: Every day | ORAL | 3 refills | Status: DC

## 2020-04-04 NOTE — Progress Notes (Signed)
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  acetaminophen (TYLENOL) 500 MG tablet, Take 500 mg by mouth every 6 (six) hours as needed for mild pain or moderate pain., Disp: , Rfl:  .  aspirin EC 81 MG tablet, Take 81 mg by mouth daily. , Disp: , Rfl:  .  atorvastatin (LIPITOR) 80 MG tablet, Take 1 tablet (80 mg total) by mouth every evening., Disp: 30 tablet, Rfl: 1 .  B Complex Vitamins (VITAMIN B-COMPLEX) TABS, Take 1 tablet by mouth daily. , Disp: , Rfl:  .  carvedilol (COREG) 3.125 MG tablet, Take 1 tablet (3.125 mg total) by mouth 2 (two) times daily with a meal., Disp: 60 tablet, Rfl: 1 .  cholecalciferol (VITAMIN D3) 25 MCG (1000 UNIT) tablet, Take 1,000 Units by mouth daily., Disp: , Rfl:  .  clopidogrel (PLAVIX) 75 MG tablet, Take 75 mg by mouth daily., Disp: , Rfl:  .  Coenzyme Q10 (CO Q-10) 200 MG CAPS, Take 1 tablet by mouth daily., Disp: , Rfl:  .  furosemide (LASIX) 20 MG tablet, Take 1 tablet (20 mg total) by mouth daily., Disp: 90 tablet, Rfl: 3 .  glucosamine-chondroitin 500-400 MG tablet, Take 1 tablet by mouth daily., Disp: , Rfl:  .  losartan (COZAAR) 100 MG tablet, Take 1 tablet (100 mg total) by mouth daily., Disp: 90 tablet, Rfl: 3 .  Multiple Vitamin (MULTIVITAMIN WITH MINERALS) TABS tablet, Take 1 tablet by mouth daily., Disp:  , Rfl:  .  RA KRILL OIL 500 MG CAPS, Take by mouth., Disp: , Rfl:  .  Turmeric (QC TUMERIC COMPLEX) 500 MG CAPS, Take 1 tablet by mouth daily., Disp: , Rfl:  .  Zinc 30 MG CAPS, Take 1 capsule by mouth daily., Disp: , Rfl:   Past Medical History: Past Medical History:  Diagnosis Date  . Aortic stenosis 02/22/2020  . Coronary artery disease   . High cholesterol   . History of lymphoma   . Hypertension   . S/P aortic valve replacement with bioprosthetic valve 02/22/2020   [facility] Inspiris Resilia stented bovine pericardial tissue valve, size 25 mm  . 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 Types: Cigarettes  . Quit date: 2003  . Years since quitting: 18.8  Smokeless Tobacco Never Used    Labs: Recent Chemical engineer    Labs for ITP Cardiac and Pulmonary Rehab Latest Ref Rng & Units 02/22/2020 02/22/2020 02/23/2020 02/23/2020 02/23/2020   Cholestrol 0 - 200 mg/dL - - - - -   LDLCALC 0 - 99 mg/dL - - - - -   HDL >40 mg/dL - - - - -   Trlycerides <150 mg/dL - - - - -  Hemoglobin A1c 4.8 - 5.6 % - - - - -   PHART 7.35 - 7.45 7.292(L) 7.295(L) 7.325(L) 7.367 7.364   PCO2ART 32 - 48 mmHg 51.1(H) 47.5 44.8 39.0 36.8   HCO3 20.0 - 28.0 mmol/L 25.0 23.3 23.4 22.5 21.0   TCO2 22 - 32 mmol/L 27 25 25 24 22    ACIDBASEDEF 0.0 - 2.0 mmol/L 2.0 4.0(H) 3.0(H) 3.0(H) 4.0(H)   O2SAT % 93.0 91.0 97.0 96.0 94.0      Capillary Blood Glucose: Lab Results  Component Value Date   GLUCAP 153 (H) 02/24/2020   GLUCAP 138 (H) 02/24/2020   GLUCAP 149 (H) 02/23/2020   GLUCAP 147 (H) 02/23/2020   GLUCAP 163 (H) 02/23/2020     Exercise Target Goals: Exercise Program Goal: Individual exercise prescription set using results from initial 6 min walk test and THRR while considering  patient's activity barriers and safety.   Exercise Prescription Goal: Starting with aerobic activity 30 plus minutes a day, 3  days per week for initial exercise prescription. Provide home exercise prescription and guidelines that participant acknowledges understanding prior to discharge.  Activity Barriers & Risk Stratification:  Activity Barriers & Cardiac Risk Stratification - 03/31/20 1017      Activity Barriers & Cardiac Risk Stratification   Activity Barriers Incisional Pain    Cardiac Risk Stratification High           6 Minute Walk:  6 Minute Walk    Row Name 03/31/20 1016         6 Minute Walk   Phase Initial     Distance 1490 feet     Walk Time 6 minutes     # of Rest Breaks 0     MPH 2.8     METS 2.9     RPE 12     Perceived Dyspnea  0     VO2 Peak 10.4     Symptoms No     Resting HR 71 bpm     Resting BP 134/70     Resting Oxygen Saturation  97 %     Exercise Oxygen Saturation  during 6 min walk 97 %     Max Ex. HR 85 bpm     Max Ex. BP 140/80     2 Minute Post BP 130/68            Oxygen Initial Assessment:   Oxygen Re-Evaluation:   Oxygen Discharge (Final Oxygen Re-Evaluation):   Initial Exercise Prescription:  Initial Exercise Prescription - 04/04/20 1300      Date of Initial Exercise RX and Referring Provider   Referring Provider Latricia Heft MD, [facility]/ Dr Fransico Him Covering           Perform Capillary Blood Glucose checks as needed.  Exercise Prescription Changes:   Exercise Prescription Changes    Row Name 04/04/20 1300             Response to Exercise   Blood Pressure (Admit) 130/68       Blood Pressure (Exercise) 144/74       Blood Pressure (Exit) 142/70       Heart Rate (Admit) 62 bpm       Heart Rate (Exercise) 97 bpm       Heart Rate (Exit) 69 bpm       Rating of Perceived Exertion (Exercise) 12       Perceived Dyspnea (Exercise) 0       Symptoms None  Comments Pt's first day of exercise       Duration Progress to 30 minutes of  aerobic without signs/symptoms of physical distress       Intensity THRR unchanged          Progression   Progression Continue to progress workloads to maintain intensity without signs/symptoms of physical distress.       Average METs 2.8         Resistance Training   Training Prescription Yes       Weight 2lbs       Reps 10-15       Time 10 Minutes         Interval Training   Interval Training No         Treadmill   MPH 2.4       Grade 1       Minutes 15       METs 3.17         NuStep   Level 2       SPM 85       Minutes 15       METs 2.4              Exercise Comments:   Exercise Comments    Row Name 04/04/20 1307           Exercise Comments Pt's first day of exercise. Pt oriented to equipment. Pt responded well to prescription. Will continue to monitor.              Exercise Goals and Review:   Exercise Goals    Row Name 03/31/20 1017             Exercise Goals   Increase Physical Activity Yes       Intervention Provide advice, education, support and counseling about physical activity/exercise needs.;Develop an individualized exercise prescription for aerobic and resistive training based on initial evaluation findings, risk stratification, comorbidities and participant's personal goals.       Expected Outcomes Short Term: Attend rehab on a regular basis to increase amount of physical activity.;Long Term: Add in home exercise to make exercise part of routine and to increase amount of physical activity.;Long Term: Exercising regularly at least 3-5 days a week.       Increase Strength and Stamina Yes       Intervention Provide advice, education, support and counseling about physical activity/exercise needs.;Develop an individualized exercise prescription for aerobic and resistive training based on initial evaluation findings, risk stratification, comorbidities and participant's personal goals.       Expected Outcomes Short Term: Increase workloads from initial exercise prescription for resistance, speed, and METs.;Short Term: Perform  resistance training exercises routinely during rehab and add in resistance training at home;Long Term: Improve cardiorespiratory fitness, muscular endurance and strength as measured by increased METs and functional capacity (6MWT)       Able to understand and use rate of perceived exertion (RPE) scale Yes       Intervention Provide education and explanation on how to use RPE scale       Expected Outcomes Short Term: Able to use RPE daily in rehab to express subjective intensity level;Long Term:  Able to use RPE to guide intensity level when exercising independently       Knowledge and understanding of Target Heart Rate Range (THRR) Yes       Intervention Provide education and explanation of THRR including how the numbers were predicted and where they are  located for reference       Expected Outcomes Short Term: Able to state/look up THRR;Long Term: Able to use THRR to govern intensity when exercising independently;Short Term: Able to use daily as guideline for intensity in rehab       Able to check pulse independently Yes       Intervention Provide education and demonstration on how to check pulse in carotid and radial arteries.;Review the importance of being able to check your own pulse for safety during independent exercise       Expected Outcomes Short Term: Able to explain why pulse checking is important during independent exercise;Long Term: Able to check pulse independently and accurately       Understanding of Exercise Prescription Yes       Intervention Provide education, explanation, and written materials on patient's individual exercise prescription       Expected Outcomes Short Term: Able to explain program exercise prescription;Long Term: Able to explain home exercise prescription to exercise independently              Exercise Goals Re-Evaluation :  Exercise Goals Re-Evaluation    Row Name 04/04/20 1307             Exercise Goal Re-Evaluation   Exercise Goals Review  Understanding of Exercise Prescription;Knowledge and understanding of Target Heart Rate Range (THRR);Able to understand and use rate of perceived exertion (RPE) scale       Comments Pt's first day of exercise. Pt able to exercise 30 minutes with no difficulty. Will continue to monitor and progress workloads as tolerated.       Expected Outcomes Pt will continue to increase strength and stamina.               Discharge Exercise Prescription (Final Exercise Prescription Changes):  Exercise Prescription Changes - 04/04/20 1300      Response to Exercise   Blood Pressure (Admit) 130/68    Blood Pressure (Exercise) 144/74    Blood Pressure (Exit) 142/70    Heart Rate (Admit) 62 bpm    Heart Rate (Exercise) 97 bpm    Heart Rate (Exit) 69 bpm    Rating of Perceived Exertion (Exercise) 12    Perceived Dyspnea (Exercise) 0    Symptoms None    Comments Pt's first day of exercise    Duration Progress to 30 minutes of  aerobic without signs/symptoms of physical distress    Intensity THRR unchanged      Progression   Progression Continue to progress workloads to maintain intensity without signs/symptoms of physical distress.    Average METs 2.8      Resistance Training   Training Prescription Yes    Weight 2lbs    Reps 10-15    Time 10 Minutes      Interval Training   Interval Training No      Treadmill   MPH 2.4    Grade 1    Minutes 15    METs 3.17      NuStep   Level 2    SPM 85    Minutes 15    METs 2.4           Nutrition:  Target Goals: Understanding of nutrition guidelines, daily intake of sodium 1500mg , cholesterol 200mg , calories 30% from fat and 7% or less from saturated fats, daily to have 5 or more servings of fruits and vegetables.  Biometrics:  Pre Biometrics - 03/31/20 1018      Pre Biometrics  Height 5' 1.25" (1.556 m)    Weight 70.1 kg    Waist Circumference 39.5 inches    Hip Circumference 40 inches    Waist to Hip Ratio 0.99 %    BMI  (Calculated) 28.95    Triceps Skinfold 11 mm    % Body Fat 27.5 %    Grip Strength 30 kg    Flexibility 11 in    Single Leg Stand 30 seconds            Nutrition Therapy Plan and Nutrition Goals:   Nutrition Assessments:   Nutrition Goals Re-Evaluation:   Nutrition Goals Discharge (Final Nutrition Goals Re-Evaluation):   Psychosocial: Target Goals: Acknowledge presence or absence of significant depression and/or stress, maximize coping skills, provide positive support system. Participant is able to verbalize types and ability to use techniques and skills needed for reducing stress and depression.  Initial Review & Psychosocial Screening:  Initial Psych Review & Screening - 03/31/20 1123      Initial Review   Current issues with None Identified      Family Dynamics   Good Support System? Yes   Alan Mcdonald has his wife for support     Barriers   Psychosocial barriers to participate in program There are no identifiable barriers or psychosocial needs.      Screening Interventions   Interventions Encouraged to exercise           Quality of Life Scores:  Quality of Life - 03/31/20 1022      Quality of Life   Select Quality of Life      Quality of Life Scores   Health/Function Pre 26.7 %    Socioeconomic Pre 26.81 %    Psych/Spiritual Pre 30 %    Family Pre 25.2 %    GLOBAL Pre 27.17 %          Scores of 19 and below usually indicate a poorer quality of life in these areas.  A difference of  2-3 points is a clinically meaningful difference.  A difference of 2-3 points in the total score of the Quality of Life Index has been associated with significant improvement in overall quality of life, self-image, physical symptoms, and general health in studies assessing change in quality of life.  PHQ-9: Recent Review Flowsheet Data    Depression screen [facility] 2/9 03/31/2020   Decreased Interest 0   Down, Depressed, Hopeless 0   PHQ - 2 Score 0     Interpretation of  Total Score  Total Score Depression Severity:  1-4 = Minimal depression, 5-9 = Mild depression, 10-14 = Moderate depression, 15-19 = Moderately severe depression, 20-27 = Severe depression   Psychosocial Evaluation and Intervention:   Psychosocial Re-Evaluation:  Psychosocial Re-Evaluation    [place] Name 04/04/20 1441             Psychosocial Re-Evaluation   Current issues with None Identified       Interventions Encouraged to attend Cardiac Rehabilitation for the exercise       Continue Psychosocial Services  No Follow up required              Psychosocial Discharge (Final Psychosocial Re-Evaluation):  Psychosocial Re-Evaluation - 04/04/20 1441      Psychosocial Re-Evaluation   Current issues with None Identified    Interventions Encouraged to attend Cardiac Rehabilitation for the exercise    Continue Psychosocial Services  No Follow up required  Vocational Rehabilitation: Provide vocational rehab assistance to qualifying candidates.   Vocational Rehab Evaluation & Intervention:  Vocational Rehab - 03/31/20 1124      Initial Vocational Rehab Evaluation & Intervention   Assessment shows need for Vocational Rehabilitation No   Alan Mcdonald is semi retired and does not need vocational rehab at this time          Education: Education Goals: Education classes will be provided on a weekly basis, covering required topics. Participant will state understanding/return demonstration of topics presented.  Learning Barriers/Preferences:  Learning Barriers/Preferences - 03/31/20 1026      Learning Barriers/Preferences   Learning Barriers Sight    Learning Preferences Written Material;Verbal Instruction;Group Instruction           Education Topics: Hypertension, Hypertension Reduction -Define heart disease and high blood pressure. Discus how high blood pressure affects the body and ways to reduce high blood pressure.   Exercise and Your Heart -Discuss why it is  important to exercise, the FITT principles of exercise, normal and abnormal responses to exercise, and how to exercise safely.   Angina -Discuss definition of angina, causes of angina, treatment of angina, and how to decrease risk of having angina.   Cardiac Medications -Review what the following cardiac medications are used for, how they affect the body, and side effects that may occur when taking the medications.  Medications include Aspirin, Beta blockers, calcium channel blockers, ACE Inhibitors, angiotensin receptor blockers, diuretics, digoxin, and antihyperlipidemics.   Congestive Heart Failure -Discuss the definition of CHF, how to live with CHF, the signs and symptoms of CHF, and how keep track of weight and sodium intake.   Heart Disease and Intimacy -Discus the effect sexual activity has on the heart, how changes occur during intimacy as we age, and safety during sexual activity.   Smoking Cessation / COPD -Discuss different methods to quit smoking, the health benefits of quitting smoking, and the definition of COPD.   Nutrition I: Fats -Discuss the types of cholesterol, what cholesterol does to the heart, and how cholesterol levels can be controlled.   Nutrition II: Labels -Discuss the different components of food labels and how to read food label   Heart Parts/Heart Disease and PAD -Discuss the anatomy of the heart, the pathway of blood circulation through the heart, and these are affected by heart disease.   Stress I: Signs and Symptoms -Discuss the causes of stress, how stress may lead to anxiety and depression, and ways to limit stress.   Stress II: Relaxation -Discuss different types of relaxation techniques to limit stress.   Warning Signs of Stroke / TIA -Discuss definition of a stroke, what the signs and symptoms are of a stroke, and how to identify when someone is having stroke.   Knowledge Questionnaire Score:  Knowledge Questionnaire Score -  03/31/20 1022      Knowledge Questionnaire Score   Pre Score 21/24           Core Components/Risk Factors/Patient Goals at Admission:  Personal Goals and Risk Factors at Admission - 03/31/20 1125      Core Components/Risk Factors/Patient Goals on Admission    Weight Management Yes    Intervention Weight Management: Develop a combined nutrition and exercise program designed to reach desired caloric intake, while maintaining appropriate intake of nutrient and fiber, sodium and fats, and appropriate energy expenditure required for the weight goal.;Weight Management: Provide education and appropriate resources to help participant work on and attain dietary goals.  Expected Outcomes Short Term: Continue to assess and modify interventions until short term weight is achieved;Long Term: Adherence to nutrition and physical activity/exercise program aimed toward attainment of established weight goal;Weight Maintenance: Understanding of the daily nutrition guidelines, which includes 25-35% calories from fat, 7% or less cal from saturated fats, less than 200mg  cholesterol, less than 1.5gm of sodium, & 5 or more servings of fruits and vegetables daily    Hypertension Yes    Intervention Provide education on lifestyle modifcations including regular physical activity/exercise, weight management, moderate sodium restriction and increased consumption of fresh fruit, vegetables, and low fat dairy, alcohol moderation, and smoking cessation.;Monitor prescription use compliance.    Expected Outcomes Short Term: Continued assessment and intervention until BP is < 140/28mm HG in hypertensive participants. < 130/104mm HG in hypertensive participants with diabetes, heart failure or chronic kidney disease.;Long Term: Maintenance of blood pressure at goal levels.    Lipids Yes    Intervention Provide education and support for participant on nutrition & aerobic/resistive exercise along with prescribed medications to  achieve LDL 70mg , HDL >40mg .    Expected Outcomes Short Term: Participant states understanding of desired cholesterol values and is compliant with medications prescribed. Participant is following exercise prescription and nutrition guidelines.;Long Term: Cholesterol controlled with medications as prescribed, with individualized exercise RX and with personalized nutrition plan. Value goals: LDL < 70mg , HDL > 40 mg.           Core Components/Risk Factors/Patient Goals Review:   Goals and Risk Factor Review    Row Name 04/04/20 1442             Core Components/Risk Factors/Patient Goals Review   Personal Goals Review Weight Management/Obesity;Lipids;Hypertension       Review Alan Mcdonald started exercise on 04/04/20. Patient did well. Vital signs were stable       Expected Outcomes Patient will continue to partcipate in phase 2 cardiac rehab for exercise, nutrtion and lifestyle modifications              Core Components/Risk Factors/Patient Goals at Discharge (Final Review):   Goals and Risk Factor Review - 04/04/20 1442      Core Components/Risk Factors/Patient Goals Review   Personal Goals Review Weight Management/Obesity;Lipids;Hypertension    Review Alan Mcdonald started exercise on 04/04/20. Patient did well. Vital signs were stable    Expected Outcomes Patient will continue to partcipate in phase 2 cardiac rehab for exercise, nutrtion and lifestyle modifications           ITP Comments:  ITP Comments    Row Name 03/31/20 0839 04/04/20 1411         ITP Comments Dr Fransico Him MD, Medical Director 30 Day ITP Review. Alan Mcdonald started exercise on 04/04/20. Patient did well. Vital signs were stable.             Comments: See ITP comments.Barnet Pall, RN,BSN 04/07/2020 12:35 PM

## 2020-04-04 NOTE — Progress Notes (Signed)
Daily Session Note  Patient Details  Name: Isahia Hollerbach MRN: [id number] Date of Birth: 25-Jun-1950 Referring Provider:     CARDIAC REHAB PHASE II EXERCISE from 04/04/2020 in [place]  Referring Provider Latricia Heft MD, [place]/ Dr Fransico Him Covering      Encounter Date: 04/04/2020  Check In:  Session Check In - 04/04/20 1051      Check-In   Supervising physician immediately available to respond to emergencies [facility] immediately available    Physician(s) Dr. Maylene Roes    Location [facility]    Staff Present Dorma Russell, MS,ACSM CEP, Exercise Physiologist;Antonyo Hinderer, RN, Deland Pretty, MS, ACSM CEP, Exercise Physiologist;David Makemson, MS, EP-C, CCRP    Virtual Visit No    Medication changes reported     No    Fall or balance concerns reported    No    Tobacco Cessation No Change    Warm-up and Cool-down Performed on first and last piece of equipment    Resistance Training Performed Yes    VAD Patient? No    PAD/SET Patient? No      Pain Assessment   Currently in Pain? No/denies    Pain Score 0-No pain    Multiple Pain Sites No           Capillary Blood Glucose: No results found for this or any previous visit (from the past 24 hour(s)).   Exercise Prescription Changes - 04/04/20 1300      Response to Exercise   Blood Pressure (Admit) 130/68    Blood Pressure (Exercise) 144/74    Blood Pressure (Exit) 142/70    Heart Rate (Admit) 62 bpm    Heart Rate (Exercise) 97 bpm    Heart Rate (Exit) 69 bpm    Rating of Perceived Exertion (Exercise) 12    Perceived Dyspnea (Exercise) 0    Symptoms None    Comments Pt's first day of exercise    Duration Progress to 30 minutes of  aerobic without signs/symptoms of physical distress    Intensity THRR unchanged      Progression   Progression Continue to progress workloads to maintain intensity without signs/symptoms of physical distress.     Average METs 2.8      Resistance Training   Training Prescription Yes    Weight 2lbs    Reps 10-15    Time 10 Minutes      Interval Training   Interval Training No      Treadmill   MPH 2.4    Grade 1    Minutes 15    METs 3.17      NuStep   Level 2    SPM 85    Minutes 15    METs 2.4           Social History   Tobacco Use  Smoking Status Former Smoker  . Types: Cigarettes  . Quit date: 2003  . Years since quitting: 18.8  Smokeless Tobacco Never Used    Goals Met:  Exercise tolerated well No report of cardiac concerns or symptoms Strength training completed today  Goals Unmet:  Not Applicable  Comments: [place]  started cardiac rehab today.  Pt tolerated light exercise without difficulty. VSS, telemetry-Sinus Rhythm, asymptomatic.  Medication list reconciled. Pt denies barriers to medicaiton compliance.  PSYCHOSOCIAL ASSESSMENT:  PHQ-0. Pt exhibits positive coping skills, hopeful outlook with supportive family. No psychosocial needs identified at this time, no psychosocial interventions  necessary.    Pt enjoys fixing things around the house.   Pt oriented to exercise equipment and routine.    Understanding verbalized.Barnet Pall, RN,BSN 04/04/2020 2:09 PM   Dr. Fransico Him is Medical Director for Cardiac Rehab at [facility].

## 2020-04-04 NOTE — Telephone Encounter (Signed)
°*  STAT* If patient is at the pharmacy, call can be transferred to refill team.   1. Which medications need to be refilled? (please list name of each medication and dose if known) losartan (COZAAR) 100 MG tablet   2. Which pharmacy/location (including street and city if local pharmacy) is medication to be sent to? [place], [place]   3. Do they need a 30 day or 90 day supply? [place]

## 2020-04-04 NOTE — Telephone Encounter (Signed)
Pt's medication was sent to pt's pharmacy as requested. Confirmation received.  °

## 2020-04-06 ENCOUNTER — Telehealth: Payer: Self-pay | Admitting: *Deleted

## 2020-04-06 ENCOUNTER — Other Ambulatory Visit: Payer: Self-pay

## 2020-04-06 ENCOUNTER — Telehealth: Payer: Self-pay | Admitting: Cardiology

## 2020-04-06 ENCOUNTER — Encounter (HOSPITAL_COMMUNITY)
Admission: RE | Admit: 2020-04-06 | Discharge: 2020-04-06 | Disposition: A | Discharge status: Home / Self Care | Payer: No Typology Code available for payment source | Source: Ambulatory Visit | Attending: Interventional Cardiology | Admitting: Interventional Cardiology

## 2020-04-06 DIAGNOSIS — Z951 Presence of aortocoronary bypass graft: Secondary | ICD-10-CM | POA: Diagnosis not present

## 2020-04-06 DIAGNOSIS — I214 Non-ST elevation (NSTEMI) myocardial infarction: Secondary | ICD-10-CM

## 2020-04-06 DIAGNOSIS — Z952 Presence of prosthetic heart valve: Secondary | ICD-10-CM

## 2020-04-06 NOTE — Telephone Encounter (Signed)
Patient's wife, Alan Mcdonald contacted the office stating [name] EVH leg was swollen from ankle to shin. She states it is not warm to touch, painful, or showing any signs of infection. She states that since she called this morning the swelling had already decreased. Pt started cardiac rehab Monday and that is when the swelling started. Pt was instructed to elevate leg when resting and encouraged to continue to wear compression stockings. Wife acknowledges. All questions answered.

## 2020-04-06 NOTE — Telephone Encounter (Signed)
Wife of patient called. The patient has a lot of redness on the leg where the vein was harvested for his bypass. The wife said there is no heat coming from the leg and it is no more sore than it was after the surgery.  The wife wanted to know if she needed to be concerned for any reason. She wold like a Nurse to call her for advice. The patient is supposed to go to Cardiac Rehab later today   She will also try to send pictures in a MyChart message that will include some pictures.

## 2020-04-06 NOTE — Telephone Encounter (Signed)
Pt contacting [facility] to further discuss. Will forward to their office for review.

## 2020-04-06 NOTE — Telephone Encounter (Signed)
Advised to contact CABG surgeon's office to discuss further.  Explained that they would be the ones to evaluate leg post surgery. Aware to call us back if [facility] office is not agreeable to following up on this concern. Patient & wife  verbalized understanding and agreeable to plan.

## 2020-04-08 ENCOUNTER — Encounter (HOSPITAL_COMMUNITY)
Admission: RE | Admit: 2020-04-08 | Discharge: 2020-04-08 | Disposition: A | Discharge status: Home / Self Care | Payer: No Typology Code available for payment source | Source: Ambulatory Visit | Attending: Interventional Cardiology | Admitting: Interventional Cardiology

## 2020-04-08 ENCOUNTER — Other Ambulatory Visit: Payer: Self-pay

## 2020-04-08 DIAGNOSIS — I214 Non-ST elevation (NSTEMI) myocardial infarction: Secondary | ICD-10-CM

## 2020-04-08 DIAGNOSIS — Z951 Presence of aortocoronary bypass graft: Secondary | ICD-10-CM

## 2020-04-08 DIAGNOSIS — Z952 Presence of prosthetic heart valve: Secondary | ICD-10-CM

## 2020-04-11 ENCOUNTER — Encounter (HOSPITAL_COMMUNITY): Payer: Medicare Other

## 2020-04-12 ENCOUNTER — Ambulatory Visit (HOSPITAL_COMMUNITY): Payer: Medicare Other | Attending: Cardiovascular Disease

## 2020-04-12 ENCOUNTER — Other Ambulatory Visit: Payer: Self-pay

## 2020-04-12 DIAGNOSIS — Z952 Presence of prosthetic heart valve: Secondary | ICD-10-CM | POA: Diagnosis not present

## 2020-04-12 LAB — ECHOCARDIOGRAM COMPLETE
AV Mean grad: 5.6 mmHg
AV Peak grad: 9.2 mmHg
Ao pk vel: 1.52 m/s
Area-P 1/2: 3.08 cm2
S' Lateral: 2.8 cm

## 2020-04-13 ENCOUNTER — Ambulatory Visit: Payer: PRIVATE HEALTH INSURANCE | Admitting: Gastroenterology

## 2020-04-13 ENCOUNTER — Encounter (HOSPITAL_COMMUNITY)
Admission: RE | Admit: 2020-04-13 | Discharge: 2020-04-13 | Disposition: A | Discharge status: Home / Self Care | Payer: Medicare Other | Source: Ambulatory Visit | Attending: Interventional Cardiology | Admitting: Interventional Cardiology

## 2020-04-13 DIAGNOSIS — I214 Non-ST elevation (NSTEMI) myocardial infarction: Secondary | ICD-10-CM | POA: Diagnosis not present

## 2020-04-13 DIAGNOSIS — Z951 Presence of aortocoronary bypass graft: Secondary | ICD-10-CM | POA: Diagnosis present

## 2020-04-13 DIAGNOSIS — Z952 Presence of prosthetic heart valve: Secondary | ICD-10-CM | POA: Diagnosis present

## 2020-04-13 NOTE — Progress Notes (Addendum)
Reviewed home exercise guidelines with patient including endpoints, temperature precautions, target heart rate and rate of perceived exertion. Patient is exercising daily and doing well. Patient is currently walking on his treadmill, using his Elliptical machine or Total gym as his mode of home exercise. Patient walks about 1 mile in 25 minutes on his TM at 2.4 mph 1.5% incline. Patient has a pulse oximeter that he uses to monitor his heart rate. Patient has 1,2,5, and 10 lb weights at home. Patient noted that he has some left shoulder weakness when doing the overhead shoulder press. Patient has resumed doing sit-ups at home. I encouraged patient to make sure he discusses with his physician because of sternal precautions, and patient is agreeable to this. Patient will hold sit-ups until talking with his physician. Patient voices understanding of instructions given.  Sol Passer, MS, ACSM CEP

## 2020-04-15 ENCOUNTER — Other Ambulatory Visit: Payer: Self-pay

## 2020-04-15 ENCOUNTER — Encounter (HOSPITAL_COMMUNITY)
Admission: RE | Admit: 2020-04-15 | Discharge: 2020-04-15 | Disposition: A | Discharge status: Home / Self Care | Payer: Medicare Other | Source: Ambulatory Visit | Attending: Interventional Cardiology | Admitting: Interventional Cardiology

## 2020-04-15 DIAGNOSIS — Z952 Presence of prosthetic heart valve: Secondary | ICD-10-CM

## 2020-04-15 DIAGNOSIS — Z951 Presence of aortocoronary bypass graft: Secondary | ICD-10-CM

## 2020-04-15 DIAGNOSIS — I214 Non-ST elevation (NSTEMI) myocardial infarction: Secondary | ICD-10-CM

## 2020-04-18 ENCOUNTER — Encounter (HOSPITAL_COMMUNITY)
Admission: RE | Admit: 2020-04-18 | Discharge: 2020-04-18 | Disposition: A | Discharge status: Home / Self Care | Payer: Medicare Other | Source: Ambulatory Visit | Attending: Interventional Cardiology | Admitting: Interventional Cardiology

## 2020-04-18 ENCOUNTER — Other Ambulatory Visit: Payer: Self-pay

## 2020-04-18 DIAGNOSIS — Z952 Presence of prosthetic heart valve: Secondary | ICD-10-CM

## 2020-04-18 DIAGNOSIS — I214 Non-ST elevation (NSTEMI) myocardial infarction: Secondary | ICD-10-CM

## 2020-04-18 DIAGNOSIS — Z951 Presence of aortocoronary bypass graft: Secondary | ICD-10-CM

## 2020-04-18 NOTE — Progress Notes (Signed)
Alan Mcdonald 69 y.o. male Nutrition Note  Visit Diagnosis: S/P CABG x 2  02/22/20  NSTEMI (non-ST elevated myocardial infarction) ([place]) 02/21/20  S/P AVR (aortic valve replacement) 02/22/20   Past Medical History:  Diagnosis Date  . Aortic stenosis 02/22/2020  . Coronary artery disease   . High cholesterol   . History of lymphoma   . Hypertension   . S/P aortic valve replacement with bioprosthetic valve 02/22/2020   Edwards Inspiris Resilia stented bovine pericardial tissue valve, size 25 mm  . 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   Current Outpatient Medications:  .  acetaminophen (TYLENOL) 500 MG tablet, Take 500 mg by mouth every 6 (six) hours as needed for mild pain or moderate pain., Disp: , Rfl:  .  aspirin EC 81 MG tablet, Take 81 mg by mouth daily. , Disp: , Rfl:  .  atorvastatin (LIPITOR) 80 MG tablet, Take 1 tablet (80 mg total) by mouth every evening., Disp: 30 tablet, Rfl: 1 .  B Complex Vitamins (VITAMIN B-COMPLEX) TABS, Take 1 tablet by mouth daily. , Disp: , Rfl:  .  carvedilol (COREG) 3.125 MG tablet, Take 1 tablet (3.125 mg total) by mouth 2 (two) times daily with a meal., Disp: 60 tablet, Rfl: 1 .  cholecalciferol (VITAMIN D3) 25 MCG (1000 UNIT) tablet, Take 1,000 Units by mouth daily., Disp: , Rfl:  .  clopidogrel (PLAVIX) 75 MG tablet, Take 75 mg by mouth daily., Disp: , Rfl:  .  Coenzyme Q10 (CO Q-10) 200 MG CAPS, Take 1 tablet by mouth daily., Disp: , Rfl:  .  furosemide (LASIX) 20 MG tablet, Take 1 tablet (20 mg total) by mouth daily., Disp: 90 tablet, Rfl: 3 .  glucosamine-chondroitin 500-400 MG tablet, Take 1 tablet by mouth daily., Disp: , Rfl:  .  losartan (COZAAR) 100 MG tablet, Take 1 tablet (100 mg total) by mouth daily., Disp: 90 tablet, Rfl: 3 .  Multiple Vitamin (MULTIVITAMIN WITH MINERALS) TABS tablet, Take 1 tablet by mouth daily., Disp: , Rfl:  .  RA KRILL OIL 500 MG CAPS, Take by mouth., Disp: , Rfl:  .   Turmeric (QC TUMERIC COMPLEX) 500 MG CAPS, Take 1 tablet by mouth daily., Disp: , Rfl:  .  Zinc 30 MG CAPS, Take 1 capsule by mouth daily., Disp: , Rfl:    Ht Readings from Last 1 Encounters:  03/31/20 5' 1.25" (1.556 m)     Wt Readings from Last 3 Encounters:  03/31/20 154 lb 8.7 oz (70.1 kg)  03/21/20 150 lb (68 kg)  03/14/20 152 lb (68.9 kg)     There is no height or weight on file to calculate BMI.   Social History   Tobacco Use  Smoking Status Former Smoker  . Types: Cigarettes  . Quit date: 2003  . Years since quitting: 18.8  Smokeless Tobacco Never Used     Lab Results  Component Value Date   CHOL 151 02/22/2020   Lab Results  Component Value Date   HDL 37 (L) 02/22/2020   Lab Results  Component Value Date   LDLCALC 84 02/22/2020   Lab Results  Component Value Date   TRIG 152 (H) 02/22/2020     Lab Results  Component Value Date   HGBA1C 5.3 02/22/2020     CBG (last 3)  No results for input(s): GLUCAP in the last 72 hours.   Nutrition Note  Spoke  with pt. Nutrition Plan and Nutrition Survey goals reviewed with pt. Pt is following a Heart Healthy diet. His wife is supportive and loves cooking. She helps him maintain a heart healthy diet. He weighs daily to assess fluid. He wears compression socks as he has had some lower leg edema.  Exercises at home. Plans to do this after rehab.  Reading labels. Avoiding salt shaker. Rarely drinks sugary beverages. Tries to eat desserts in moderation. Chooses whole grains and veggies daily.  Reviewed cholesterol levels.  Recommended fatty fish, fiber, and decreased saturated fat.  Pt interested in learning more about H/H diet.  Will review at f/u.  Pt expressed understanding of the information reviewed.    Nutrition Diagnosis ? Food-and nutrition-related knowledge deficit related to lack of exposure to information as related to diagnosis of: ? CVD ?   Nutrition Intervention ? Pt's individual nutrition plan  reviewed with pt. ? Benefits of adopting Heart Healthy diet discussed when Medficts reviewed.   ? Continue client-centered nutrition education by RD, as part of interdisciplinary care.  Goal(s) ? Pt to build a healthy plate including vegetables, fruits, whole grains, and low-fat dairy products in a heart healthy meal plan.  Plan:   Will provide client-centered nutrition education as part of interdisciplinary care  Monitor and evaluate progress toward nutrition goal with team.   Michaele Offer, MS, RDN, LDN

## 2020-04-20 ENCOUNTER — Other Ambulatory Visit: Payer: Self-pay

## 2020-04-20 ENCOUNTER — Encounter (HOSPITAL_COMMUNITY)
Admission: RE | Admit: 2020-04-20 | Discharge: 2020-04-20 | Disposition: A | Discharge status: Home / Self Care | Payer: Medicare Other | Source: Ambulatory Visit | Attending: Interventional Cardiology | Admitting: Interventional Cardiology

## 2020-04-20 DIAGNOSIS — I214 Non-ST elevation (NSTEMI) myocardial infarction: Secondary | ICD-10-CM | POA: Diagnosis not present

## 2020-04-20 DIAGNOSIS — Z951 Presence of aortocoronary bypass graft: Secondary | ICD-10-CM

## 2020-04-20 DIAGNOSIS — Z952 Presence of prosthetic heart valve: Secondary | ICD-10-CM

## 2020-04-22 ENCOUNTER — Other Ambulatory Visit: Payer: Self-pay

## 2020-04-22 ENCOUNTER — Encounter (HOSPITAL_COMMUNITY)
Admission: RE | Admit: 2020-04-22 | Discharge: 2020-04-22 | Disposition: A | Discharge status: Home / Self Care | Payer: Medicare Other | Source: Ambulatory Visit | Attending: Interventional Cardiology | Admitting: Interventional Cardiology

## 2020-04-22 DIAGNOSIS — Z951 Presence of aortocoronary bypass graft: Secondary | ICD-10-CM

## 2020-04-22 DIAGNOSIS — I214 Non-ST elevation (NSTEMI) myocardial infarction: Secondary | ICD-10-CM

## 2020-04-22 DIAGNOSIS — Z952 Presence of prosthetic heart valve: Secondary | ICD-10-CM

## 2020-04-25 ENCOUNTER — Encounter (HOSPITAL_COMMUNITY)
Admission: RE | Admit: 2020-04-25 | Discharge: 2020-04-25 | Disposition: A | Discharge status: Home / Self Care | Payer: Medicare Other | Source: Ambulatory Visit | Attending: Interventional Cardiology | Admitting: Interventional Cardiology

## 2020-04-25 ENCOUNTER — Other Ambulatory Visit: Payer: Self-pay

## 2020-04-25 DIAGNOSIS — I214 Non-ST elevation (NSTEMI) myocardial infarction: Secondary | ICD-10-CM | POA: Diagnosis not present

## 2020-04-25 DIAGNOSIS — Z952 Presence of prosthetic heart valve: Secondary | ICD-10-CM

## 2020-04-25 DIAGNOSIS — Z951 Presence of aortocoronary bypass graft: Secondary | ICD-10-CM

## 2020-04-27 ENCOUNTER — Other Ambulatory Visit: Payer: Self-pay

## 2020-04-27 ENCOUNTER — Encounter (HOSPITAL_COMMUNITY)
Admission: RE | Admit: 2020-04-27 | Discharge: 2020-04-27 | Disposition: A | Discharge status: Home / Self Care | Payer: Medicare Other | Source: Ambulatory Visit | Attending: Interventional Cardiology | Admitting: Interventional Cardiology

## 2020-04-27 DIAGNOSIS — Z952 Presence of prosthetic heart valve: Secondary | ICD-10-CM

## 2020-04-27 DIAGNOSIS — I214 Non-ST elevation (NSTEMI) myocardial infarction: Secondary | ICD-10-CM

## 2020-04-27 DIAGNOSIS — Z951 Presence of aortocoronary bypass graft: Secondary | ICD-10-CM

## 2020-04-28 ENCOUNTER — Encounter: Payer: Self-pay | Admitting: Cardiology

## 2020-04-28 ENCOUNTER — Ambulatory Visit (INDEPENDENT_AMBULATORY_CARE_PROVIDER_SITE_OTHER): Payer: Medicare Other | Admitting: Cardiology

## 2020-04-28 VITALS — BP 120/60 | HR 70 | Ht 61.0 in | Wt 157.8 lb

## 2020-04-28 DIAGNOSIS — I2510 Atherosclerotic heart disease of native coronary artery without angina pectoris: Secondary | ICD-10-CM | POA: Diagnosis not present

## 2020-04-28 DIAGNOSIS — Z79899 Other long term (current) drug therapy: Secondary | ICD-10-CM

## 2020-04-28 DIAGNOSIS — I200 Unstable angina: Secondary | ICD-10-CM

## 2020-04-28 DIAGNOSIS — I350 Nonrheumatic aortic (valve) stenosis: Secondary | ICD-10-CM

## 2020-04-28 DIAGNOSIS — Z951 Presence of aortocoronary bypass graft: Secondary | ICD-10-CM

## 2020-04-28 DIAGNOSIS — I214 Non-ST elevation (NSTEMI) myocardial infarction: Secondary | ICD-10-CM

## 2020-04-28 DIAGNOSIS — Z952 Presence of prosthetic heart valve: Secondary | ICD-10-CM | POA: Diagnosis not present

## 2020-04-28 LAB — COMPREHENSIVE METABOLIC PANEL
ALT: 26 IU/L (ref 0–44)
AST: 30 IU/L (ref 0–40)
Albumin/Globulin Ratio: 2.3 — ABNORMAL HIGH (ref 1.2–2.2)
Albumin: 4.3 g/dL (ref 3.8–4.8)
Alkaline Phosphatase: 111 IU/L (ref 44–121)
BUN/Creatinine Ratio: 16 (ref 10–24)
BUN: 13 mg/dL (ref 8–27)
Bilirubin Total: 0.5 mg/dL (ref 0.0–1.2)
CO2: 22 mmol/L (ref 20–29)
Calcium: 9.1 mg/dL (ref 8.6–10.2)
Chloride: 107 mmol/L — ABNORMAL HIGH (ref 96–106)
Creatinine, Ser: 0.79 mg/dL (ref 0.76–1.27)
GFR calc Af Amer: 105 mL/min/{1.73_m2} (ref >59)
GFR calc non Af Amer: 91 mL/min/{1.73_m2} (ref >59)
Globulin, Total: 1.9 g/dL (ref 1.5–4.5)
Glucose: 99 mg/dL (ref 65–99)
Potassium: 4.1 mmol/L (ref 3.5–5.2)
Sodium: 141 mmol/L (ref 134–144)
Total Protein: 6.2 g/dL (ref 6.0–8.5)

## 2020-04-28 LAB — CBC
Hematocrit: 40.4 % (ref 37.5–51.0)
Hemoglobin: 13.4 g/dL (ref 13.0–17.7)
MCH: 30 pg (ref 26.6–33.0)
MCHC: 33.2 g/dL (ref 31.5–35.7)
MCV: 90 fL (ref 79–97)
Platelets: 133 10*3/uL — ABNORMAL LOW (ref 150–450)
RBC: 4.47 x10E6/uL (ref 4.14–5.80)
RDW: 13.9 % (ref 11.6–15.4)
WBC: 6.6 10*3/uL (ref 3.4–10.8)

## 2020-04-28 LAB — LIPID PANEL
Chol/HDL Ratio: 3.6 ratio (ref 0.0–5.0)
Cholesterol, Total: 131 mg/dL (ref 100–199)
HDL: 36 mg/dL — ABNORMAL LOW (ref >39)
LDL Chol Calc (NIH): 69 mg/dL (ref 0–99)
Triglycerides: 152 mg/dL — ABNORMAL HIGH (ref 0–149)
VLDL Cholesterol Cal: 26 mg/dL (ref 5–40)

## 2020-04-28 NOTE — Patient Instructions (Signed)
Medication Instructions:  The current medical regimen is effective;  continue present plan and medications.  *If you need a refill on your cardiac medications before your next appointment, please call your pharmacy*  Lab Work: Please have blood work today (CBC, CMP and Lipid)  If you have labs (blood work) drawn today and your tests are completely normal, you will receive your results only by:  MyChart Message (if you have MyChart) OR  A paper copy in the mail If you have any lab test that is abnormal or we need to change your treatment, we will call you to review the results.  Follow-Up: At [facility], you and your health needs are our priority.  As part of our continuing mission to provide you with exceptional heart care, we have created designated Provider Care Teams.  These Care Teams include your primary Cardiologist (physician) and Advanced Practice Providers (APPs -  Physician Assistants and Nurse Practitioners) who all work together to provide you with the care you need, when you need it.  We recommend signing up for the patient portal called "MyChart".  Sign up information is provided on this After Visit Summary.  MyChart is used to connect with patients for Virtual Visits (Telemedicine).  Patients are able to view lab/test results, encounter notes, upcoming appointments, etc.  Non-urgent messages can be sent to your provider as well.   To learn more about what you can do with MyChart, go to [email or web address].    Your next appointment:   3 month(s)  The format for your next appointment:   In Person  Provider:   Candee Furbish, MD   Thank you for choosing [facility]!!

## 2020-04-28 NOTE — Progress Notes (Signed)
Cardiology Office Note:    Date:  04/28/2020   ID:  Alan Mcdonald, DOB 08-Jul-1950, MRN [id number]  PCP:  [facility]  [facility] Cardiologist:  Candee Furbish, MD  [facility] HeartCare Electrophysiologist:  None   Referring MD: [facility]     History of Present Illness:    Alan Mcdonald is a 69 y.o. male here for the follow up of aortic valve replacement, 54mm bovine, CAD, HL, CABG x 2. Dr. Roxy Manns  [phone] emergent CABG secondary to left main disease with aortic valve replacement secondary to moderate aortic stenosis   Treadmill and elliptical (not yet) at home. No need for lasix  Both he and his wife grew up in [place], he and the ninth ward his wife, Olin Hauser, in [place].  She went to [employer].  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  Doing very well.  His edema has resolved.  Only took Lasix for 7 days.  Post op echo looks great.  No anginal symptoms.  Warned him against doing sit ups or heavy lifting at this point.  Past Medical History:  Diagnosis Date  . Aortic stenosis 02/22/2020  . Coronary artery disease   . High cholesterol   . History of lymphoma   . Hypertension   . S/P aortic valve replacement with bioprosthetic valve 02/22/2020   Edwards Inspiris Resilia stented bovine pericardial tissue valve, size 25 mm  . 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 AORTIC VALVE REPLACEMENT N/A 02/22/2020   Procedure: AORTIC VALVE REPLACEMENT (AVR) USING EDWARDS Resilia 25 MM AORTIC VALVE.;  Surgeon: Rexene Alberts, MD;  Location: [place];  Service: Open Heart Surgery;  Laterality: N/A;  . CARDIAC CATHETERIZATION    . CERVICAL SPINE SURGERY    . CHOLECYSTECTOMY    . CORONARY ARTERY BYPASS GRAFT N/A 02/22/2020   Procedure: CORONARY ARTERY BYPASS GRAFTING (CABG) USING LIMA to LAD; ENDSCOPICALLY  HARVEST RIGHT GREATER SAPHENOUS VEIN: SVG to OM1;  Surgeon: Rexene Alberts, MD;  Location: [place];  Service: Open Heart Surgery;  Laterality: N/A;  . ELBOW SURGERY    . ENDOVEIN HARVEST OF GREATER SAPHENOUS VEIN Right 02/22/2020   Procedure: ENDOVEIN HARVEST OF GREATER SAPHENOUS VEIN;  Surgeon: Rexene Alberts, MD;  Location: [facility];  Service: Open Heart Surgery;  Laterality: Right;  . KIDNEY STONE SURGERY    . LEFT HEART CATH AND CORONARY ANGIOGRAPHY N/A 02/22/2020   Procedure: LEFT HEART CATH AND CORONARY ANGIOGRAPHY;  Surgeon: Belva Crome, MD;  Location: [place] CV [facility];  Service: Cardiovascular;  Laterality: N/A;  . TEE WITHOUT CARDIOVERSION N/A 02/22/2020   Procedure: TRANSESOPHAGEAL ECHOCARDIOGRAM (TEE);  Surgeon: Rexene Alberts, MD;  Location: [place];  Service: Open Heart Surgery;  Laterality: N/A;    Current Medications: Current Meds  Medication Sig  . acetaminophen (TYLENOL) 500 MG tablet Take 500 mg by mouth every 6 (six) hours as needed for mild pain or moderate pain.  Marland Kitchen aspirin EC 81 MG tablet Take 81 mg by mouth daily.   Marland Kitchen atorvastatin (LIPITOR) 80 MG tablet Take 1 tablet (80 mg total) by mouth every evening.  . B Complex Vitamins (VITAMIN B-COMPLEX) TABS Take 1 tablet by mouth daily.   . carvedilol (COREG) 3.125 MG tablet Take 1 tablet (3.125 mg total) by mouth  2 (two) times daily with a meal.  . cholecalciferol (VITAMIN D3) 25 MCG (1000 UNIT) tablet Take 1,000 Units by mouth daily.  . clopidogrel (PLAVIX) 75 MG tablet Take 75 mg by mouth daily.  . Coenzyme Q10 (CO Q-10) 200 MG CAPS Take 1 tablet by mouth daily.  Marland Kitchen glucosamine-chondroitin 500-400 MG tablet Take 1 tablet by mouth daily.  Marland Kitchen losartan (COZAAR) 100 MG tablet Take 1 tablet (100 mg total) by mouth daily.  . Multiple Vitamin (MULTIVITAMIN WITH MINERALS) TABS tablet Take 1 tablet by mouth daily.  Marland Kitchen RA KRILL OIL 500 MG CAPS Take by mouth.  . Turmeric (QC TUMERIC COMPLEX) 500 MG CAPS Take 1 tablet by mouth daily.   . Zinc 30 MG CAPS Take 1 capsule by mouth daily.  . [DISCONTINUED] furosemide (LASIX) 20 MG tablet Take 1 tablet (20 mg total) by mouth daily.     Allergies:   Patient has no known allergies.   Social History   Socioeconomic History  . Marital status: Married    Spouse name: Not on file  . Number of children: Not on file  . Years of education: 88  . Highest education level: Associate degree: occupational, Hotel manager, or vocational program  Occupational History  . Occupation: semi retired  Tobacco Use  . Smoking status: Former Smoker    Types: Cigarettes    Quit date: 2003    Years since quitting: 18.8  . Smokeless tobacco: Never Used  Vaping Use  . Vaping Use: Never used  Substance and Sexual Activity  . Alcohol use: Yes    Comment: occasional  . Drug use: Never  . Sexual activity: Not on file  Other Topics Concern  . Not on file  Social History Narrative  . Not on file   Social Determinants of Health   Financial Resource Strain:   . Difficulty of Paying Living Expenses: Not on file  Food Insecurity:   . Worried About Charity fundraiser in the Last Year: Not on file  . Ran Out of Food in the Last Year: Not on file  Transportation Needs:   . Lack of Transportation (Medical): Not on file  . Lack of Transportation (Non-Medical): Not on file  Physical Activity:   . Days of Exercise per Week: Not on file  . Minutes of Exercise per Session: Not on file  Stress:   . Feeling of Stress : Not on file  Social Connections:   . Frequency of Communication with Friends and Family: Not on file  . Frequency of Social Gatherings with Friends and Family: Not on file  . Attends Religious Services: Not on file  . Active Member of Clubs or Organizations: Not on file  . Attends Archivist Meetings: Not on file  . Marital Status: Not on file    ROS:   Please see the history of present illness.     All other systems reviewed and are negative.  EKGs/Labs/Other Studies  Reviewed:    The following studies were reviewed today:  LHC 02/22/20  Conclusion   Severe distal left main disease  Proximal LAD 60-70% and 80% mid before large diagonal.  95% ostial circumflex stenosis and 70% dominant obtuse marginal proximal stenosis.  Diffuse proximal distal 60% stenosis some of which is diffuse in-stent. Faint septal perforator collaterals emanate from the PDA towards the LAD which is not opacified.  LV function is normal. LVEDP is 8 mmHg.  Heavily calcified aortic valve with 15 mm mean gradient on  pullback.  RECOMMENDATIONS:   He developed prolonged chest pain immediately post cath. IV fluid bolus was administered, IV nitroglycerin started, and 50 mcg of fentanyl were given. The pain gradually resolved over 15 to 20 minutes.  In lab consult with Dr. Roxy Manns we decided to do urgent CABG on the patient.  Initial plan to place intra-aortic balloon pump was decided against after decision for urgent surgery.  Diagnostic Dominance: Right   ECHO POST OP on 04/12/20: 1. Left ventricular ejection fraction, by estimation, is 60 to 65%. The  left ventricle has normal function. The left ventricle has no regional  wall motion abnormalities. Left ventricular diastolic parameters are  consistent with Grade I diastolic  dysfunction (impaired relaxation). Elevated left ventricular end-diastolic  pressure.  2. Right ventricular systolic function is normal. The right ventricular  size is normal. There is normal pulmonary artery systolic pressure.  3. The mitral valve is normal in structure. Trivial mitral valve  regurgitation. No evidence of mitral stenosis.  4. The aortic valve has been repaired/replaced. Aortic valve  regurgitation is not visualized. No aortic stenosis is present. Echo  findings are consistent with normal structure and function of the aortic  valve prosthesis. Aortic valve mean gradient  measures 5.6 mmHg. Aortic valve Vmax measures  1.52 m/s.  5. The inferior vena cava is normal in size with greater than 50%  respiratory variability, suggesting right atrial pressure of 3 mmHg.     Recent Labs: 02/21/2020: B Natriuretic Peptide 147.0 02/23/2020: Magnesium 1.8 02/25/2020: Hemoglobin 8.8; Platelets 85 03/14/2020: BUN 10; Creatinine, Ser 0.78; Potassium 4.1; Sodium 143  Recent Lipid Panel    Component Value Date/Time   CHOL 151 02/22/2020 0208   TRIG 152 (H) 02/22/2020 0208   HDL 37 (L) 02/22/2020 0208   CHOLHDL 4.1 02/22/2020 0208   VLDL 30 02/22/2020 0208   LDLCALC 84 02/22/2020 0208     Risk Assessment/Calculations:       Physical Exam:    VS:  BP 120/60   Pulse 70   Ht 5\' 1"  (1.549 m)   Wt 157 lb 12.8 oz (71.6 kg)   SpO2 95%   BMI 29.82 kg/m     Wt Readings from Last 3 Encounters:  04/28/20 157 lb 12.8 oz (71.6 kg)  03/31/20 154 lb 8.7 oz (70.1 kg)  03/21/20 150 lb (68 kg)     GEN:  Well nourished, well developed in no acute distress HEENT: Normal NECK: No JVD; No carotid bruits LYMPHATICS: No lymphadenopathy CARDIAC: RRR, no murmurs, rubs, gallops, CABG scar looks excellent RESPIRATORY:  Clear to auscultation without rales, wheezing or rhonchi  ABDOMEN: Soft, non-tender, non-distended MUSCULOSKELETAL:  No edema; No deformity  SKIN: Warm and dry NEUROLOGIC:  Alert and oriented x 3 PSYCHIATRIC:  Normal affect   ASSESSMENT:    1. NSTEMI (non-ST elevated myocardial infarction) ([place])   2. Nonrheumatic aortic valve stenosis   3. H/O aortic valve replacement   4. Coronary artery disease involving native coronary artery of native heart without angina pectoris   5. Hx of CABG   6. Medication management    PLAN:    In order of problems listed above:  NSTEMI --02/2020 --Left main dz --CABG x 2 --ASA, Plavix Statin ARB, continue with Plavix for 1 year then aspirin thereafter --Normal EF on ECHO reviewed --Cardiac rehab  CAD --CABG with Dr. Roxy Manns, emergent  surgery  Hyperlipidemia --84 LDL in hospital --Goal <70 --REPEAT lipids, liver panel.  Continue with high intensity statin atorvastatin  80.  Postop anemia -At discharge 8.8 hemoglobin.  Repeating CBC.  AV replacement secondary to aortic stenosis --28mm Bovine --Dental RX  Essential hypertension -- Coreg, losatan  Follicular lymphoma  --in remission    Shared Decision Making/Informed Consent        Medication Adjustments/Labs and Tests Ordered: Current medicines are reviewed at length with the patient today.  Concerns regarding medicines are outlined above.  Orders Placed This Encounter  Procedures  . Lipid panel  . Comprehensive metabolic panel  . CBC   No orders of the defined types were placed in this encounter.   Patient Instructions  Medication Instructions:  The current medical regimen is effective;  continue present plan and medications.  *If you need a refill on your cardiac medications before your next appointment, please call your pharmacy*  Lab Work: Please have blood work today (CBC, CMP and Lipid)  If you have labs (blood work) drawn today and your tests are completely normal, you will receive your results only by: Marland Kitchen MyChart Message (if you have MyChart) OR . A paper copy in the mail If you have any lab test that is abnormal or we need to change your treatment, we will call you to review the results.  Follow-Up: At [facility], you and your health needs are our priority.  As part of our continuing mission to provide you with exceptional heart care, we have created designated Provider Care Teams.  These Care Teams include your primary Cardiologist (physician) and Advanced Practice Providers (APPs -  Physician Assistants and Nurse Practitioners) who all work together to provide you with the care you need, when you need it.  We recommend signing up for the patient portal called "MyChart".  Sign up information is provided on this After Visit Summary.   MyChart is used to connect with patients for Virtual Visits (Telemedicine).  Patients are able to view lab/test results, encounter notes, upcoming appointments, etc.  Non-urgent messages can be sent to your provider as well.   To learn more about what you can do with MyChart, go to [email or web address].    Your next appointment:   3 month(s)  The format for your next appointment:   In Person  Provider:   Candee Furbish, MD   Thank you for choosing [facility]!!         Signed, Candee Furbish, MD  04/28/2020 9:37 AM    [place]

## 2020-04-29 ENCOUNTER — Other Ambulatory Visit: Payer: Self-pay

## 2020-04-29 ENCOUNTER — Encounter (HOSPITAL_COMMUNITY)
Admission: RE | Admit: 2020-04-29 | Discharge: 2020-04-29 | Disposition: A | Discharge status: Home / Self Care | Payer: Medicare Other | Source: Ambulatory Visit | Attending: Interventional Cardiology | Admitting: Interventional Cardiology

## 2020-04-29 DIAGNOSIS — Z952 Presence of prosthetic heart valve: Secondary | ICD-10-CM

## 2020-04-29 DIAGNOSIS — I214 Non-ST elevation (NSTEMI) myocardial infarction: Secondary | ICD-10-CM

## 2020-04-29 DIAGNOSIS — Z951 Presence of aortocoronary bypass graft: Secondary | ICD-10-CM

## 2020-05-02 ENCOUNTER — Other Ambulatory Visit: Payer: Self-pay

## 2020-05-02 ENCOUNTER — Encounter (HOSPITAL_COMMUNITY)
Admission: RE | Admit: 2020-05-02 | Discharge: 2020-05-02 | Disposition: A | Discharge status: Home / Self Care | Payer: Medicare Other | Source: Ambulatory Visit | Attending: Interventional Cardiology | Admitting: Interventional Cardiology

## 2020-05-02 DIAGNOSIS — Z952 Presence of prosthetic heart valve: Secondary | ICD-10-CM

## 2020-05-02 DIAGNOSIS — I214 Non-ST elevation (NSTEMI) myocardial infarction: Secondary | ICD-10-CM

## 2020-05-02 DIAGNOSIS — Z951 Presence of aortocoronary bypass graft: Secondary | ICD-10-CM

## 2020-05-02 NOTE — Progress Notes (Signed)
Nutrition Note    Lab Results  Component Value Date   CHOL 131 04/28/2020   Lab Results  Component Value Date   HDL 36 (L) 04/28/2020   Lab Results  Component Value Date   LDLCALC 69 04/28/2020   Lab Results  Component Value Date   TRIG 152 (H) 04/28/2020     Spoke with patient today about lipid management. Reviewed recent lipid panel. Discussed diet changes for out of range values. Reviewed heart healthy diet. Daily recommendation of estimated energy needs 1875 kcals, of 26g fiber, 16 g saturated fat, <1 g trans fat, and 1500-2000 mg sodium. Recommended eating pattern of lean protein at every meal or snack, 2 servings fatty fish per week, 3-5 servings non starchy vegetables per day, 2-3 servings fruit per day, 1 oz nuts per day. Provided handouts and recipes. Reviewed label reading.  Collaborated with pt for goal setting. Pt verbalized understanding.   Michaele Offer, MS, RDN, LDN

## 2020-05-03 NOTE — Progress Notes (Signed)
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  acetaminophen (TYLENOL) 500 MG tablet, Take 500 mg by mouth every 6 (six) hours as needed for mild pain or moderate pain., Disp: , Rfl:  .  aspirin EC 81 MG tablet, Take 81 mg by mouth daily. , Disp: , Rfl:  .  atorvastatin (LIPITOR) 80 MG tablet, Take 1 tablet (80 mg total) by mouth every evening., Disp: 30 tablet, Rfl: 1 .  B Complex Vitamins (VITAMIN B-COMPLEX) TABS, Take 1 tablet by mouth daily. , Disp: , Rfl:  .  carvedilol (COREG) 3.125 MG tablet, Take 1 tablet (3.125 mg total) by mouth 2 (two) times daily with a meal., Disp: 60 tablet, Rfl: 1 .  cholecalciferol (VITAMIN D3) 25 MCG (1000 UNIT) tablet, Take 1,000 Units by mouth daily., Disp: , Rfl:  .  clopidogrel (PLAVIX) 75 MG tablet, Take 75 mg by mouth daily., Disp: , Rfl:  .  Coenzyme Q10 (CO Q-10) 200 MG CAPS, Take 1 tablet by mouth daily., Disp: , Rfl:  .  glucosamine-chondroitin 500-400 MG tablet, Take 1 tablet by mouth daily., Disp: , Rfl:  .  losartan (COZAAR) 100 MG tablet, Take 1 tablet (100 mg total) by mouth daily., Disp: 90 tablet, Rfl: 3 .  Multiple Vitamin (MULTIVITAMIN WITH MINERALS) TABS tablet, Take 1 tablet by mouth daily., Disp: , Rfl:  .  RA KRILL OIL 500 MG CAPS, Take by mouth., Disp: , Rfl:  .  Turmeric (QC TUMERIC COMPLEX) 500  MG CAPS, Take 1 tablet by mouth daily., Disp: , Rfl:  .  Zinc 30 MG CAPS, Take 1 capsule by mouth daily., Disp: , Rfl:   Past Medical History: Past Medical History:  Diagnosis Date  . Aortic stenosis 02/22/2020  . Coronary artery disease   . High cholesterol   . History of lymphoma   . Hypertension   . S/P aortic valve replacement with bioprosthetic valve 02/22/2020   [facility] Inspiris Resilia stented bovine pericardial tissue valve, size 25 mm  . 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 Types: Cigarettes  . Quit date: 2003  . Years since quitting: 18.9  Smokeless Tobacco Never Used    Labs: Recent Chemical engineer    Labs for ITP Cardiac and Pulmonary Rehab Latest Ref Rng & Units 02/22/2020 02/23/2020 02/23/2020 02/23/2020 04/28/2020   Cholestrol 100 - 199 mg/dL - - - - 131   LDLCALC 0 - 99 mg/dL - - - - 69   HDL >39 mg/dL - - - - 36(L)   Trlycerides 0 - 149 mg/dL - - - - 152(H)   Hemoglobin A1c 4.8 - 5.6 % - - - - -   PHART 7.35 - 7.45 7.295(L) 7.325(L)  7.367 7.364 -   PCO2ART 32 - 48 mmHg 47.5 44.8 39.0 36.8 -   HCO3 20.0 - 28.0 mmol/L 23.3 23.4 22.5 21.0 -   TCO2 22 - 32 mmol/L 25 25 24 22  -   ACIDBASEDEF 0.0 - 2.0 mmol/L 4.0(H) 3.0(H) 3.0(H) 4.0(H) -   O2SAT % 91.0 97.0 96.0 94.0 -      Capillary Blood Glucose: Lab Results  Component Value Date   GLUCAP 153 (H) 02/24/2020   GLUCAP 138 (H) 02/24/2020   GLUCAP 149 (H) 02/23/2020   GLUCAP 147 (H) 02/23/2020   GLUCAP 163 (H) 02/23/2020     Exercise Target Goals: Exercise Program Goal: Individual exercise prescription set using results from initial 6 min walk test and THRR while considering  patient's activity barriers and safety.   Exercise Prescription Goal: Starting with aerobic activity 30 plus minutes a day, 3 days per week for initial exercise prescription. Provide home exercise prescription and guidelines that  participant acknowledges understanding prior to discharge.  Activity Barriers & Risk Stratification:  Activity Barriers & Cardiac Risk Stratification - 03/31/20 1017      Activity Barriers & Cardiac Risk Stratification   Activity Barriers Incisional Pain    Cardiac Risk Stratification High           6 Minute Walk:  6 Minute Walk    Row Name 03/31/20 1016         6 Minute Walk   Phase Initial     Distance 1490 feet     Walk Time 6 minutes     # of Rest Breaks 0     MPH 2.8     METS 2.9     RPE 12     Perceived Dyspnea  0     VO2 Peak 10.4     Symptoms No     Resting HR 71 bpm     Resting BP 134/70     Resting Oxygen Saturation  97 %     Exercise Oxygen Saturation  during 6 min walk 97 %     Max Ex. HR 85 bpm     Max Ex. BP 140/80     2 Minute Post BP 130/68            Oxygen Initial Assessment:   Oxygen Re-Evaluation:   Oxygen Discharge (Final Oxygen Re-Evaluation):   Initial Exercise Prescription:  Initial Exercise Prescription - 04/04/20 1300      Date of Initial Exercise RX and Referring Provider   Referring Provider Latricia Heft MD, [facility]/ Dr Fransico Him Covering           Perform Capillary Blood Glucose checks as needed.  Exercise Prescription Changes:  Exercise Prescription Changes    Row Name 04/04/20 1300 04/13/20 1050 04/25/20 1048         Response to Exercise   Blood Pressure (Admit) 130/68 124/60 130/70     Blood Pressure (Exercise) 144/74 154/80 150/80     Blood Pressure (Exit) 142/70 124/68 118/78     Heart Rate (Admit) 62 bpm 75 bpm 58 bpm     Heart Rate (Exercise) 97 bpm 87 bpm 96 bpm     Heart Rate (Exit) 69 bpm 63 bpm 58 bpm     Rating of Perceived Exertion (Exercise) 12 9 11      Perceived Dyspnea (Exercise) 0 0 0     Symptoms None None None     Comments Pt's first day of exercise -- --  Duration Progress to 30 minutes of  aerobic without signs/symptoms of physical distress Progress to 30 minutes of   aerobic without signs/symptoms of physical distress Progress to 30 minutes of  aerobic without signs/symptoms of physical distress     Intensity THRR unchanged THRR unchanged THRR unchanged       Progression   Progression Continue to progress workloads to maintain intensity without signs/symptoms of physical distress. Continue to progress workloads to maintain intensity without signs/symptoms of physical distress. Continue to progress workloads to maintain intensity without signs/symptoms of physical distress.     Average METs 2.8 2.8 3.4       Resistance Training   Training Prescription Yes No Yes     Weight 2lbs -- 3 lbs     Reps 10-15 -- 10-15     Time 10 Minutes -- 10 Minutes       Interval Training   Interval Training No No No       Treadmill   MPH 2.4 2.4 2.6     Grade 1 1 2      Minutes 15 15 15      METs 3.17 3.17 3.71       NuStep   Level 2 3 3      SPM 85 85 85     Minutes 15 15 15      METs 2.4 2.4 2.4       Home Exercise Plan   Plans to continue exercise at -- Home (comment)  Treadmill, elliptical, total gym Home (comment)  Treadmill, elliptical, total gym     Frequency -- Add 4 additional days to program exercise sessions. Add 4 additional days to program exercise sessions.     Initial Home Exercises Provided -- 04/13/20 04/13/20            Exercise Comments:  Exercise Comments    Row Name 04/04/20 1307 04/13/20 1051 04/27/20 1055       Exercise Comments Pt's first day of exercise. Pt oriented to equipment. Pt responded well to prescription. Will continue to monitor. Reviewed home exercise guidelines and goals with patient. Reviewed METs and goals with patient.            Exercise Goals and Review:  Exercise Goals    Row Name 03/31/20 1017             Exercise Goals   Increase Physical Activity Yes       Intervention Provide advice, education, support and counseling about physical activity/exercise needs.;Develop an individualized exercise  prescription for aerobic and resistive training based on initial evaluation findings, risk stratification, comorbidities and participant's personal goals.       Expected Outcomes Short Term: Attend rehab on a regular basis to increase amount of physical activity.;Long Term: Add in home exercise to make exercise part of routine and to increase amount of physical activity.;Long Term: Exercising regularly at least 3-5 days a week.       Increase Strength and Stamina Yes       Intervention Provide advice, education, support and counseling about physical activity/exercise needs.;Develop an individualized exercise prescription for aerobic and resistive training based on initial evaluation findings, risk stratification, comorbidities and participant's personal goals.       Expected Outcomes Short Term: Increase workloads from initial exercise prescription for resistance, speed, and METs.;Short Term: Perform resistance training exercises routinely during rehab and add in resistance training at home;Long Term: Improve cardiorespiratory fitness, muscular endurance and strength as measured by increased METs and functional capacity (  6MWT)       Able to understand and use rate of perceived exertion (RPE) scale Yes       Intervention Provide education and explanation on how to use RPE scale       Expected Outcomes Short Term: Able to use RPE daily in rehab to express subjective intensity level;Long Term:  Able to use RPE to guide intensity level when exercising independently       Knowledge and understanding of Target Heart Rate Range (THRR) Yes       Intervention Provide education and explanation of THRR including how the numbers were predicted and where they are located for reference       Expected Outcomes Short Term: Able to state/look up THRR;Long Term: Able to use THRR to govern intensity when exercising independently;Short Term: Able to use daily as guideline for intensity in rehab       Able to check pulse  independently Yes       Intervention Provide education and demonstration on how to check pulse in carotid and radial arteries.;Review the importance of being able to check your own pulse for safety during independent exercise       Expected Outcomes Short Term: Able to explain why pulse checking is important during independent exercise;Long Term: Able to check pulse independently and accurately       Understanding of Exercise Prescription Yes       Intervention Provide education, explanation, and written materials on patient's individual exercise prescription       Expected Outcomes Short Term: Able to explain program exercise prescription;Long Term: Able to explain home exercise prescription to exercise independently              Exercise Goals Re-Evaluation :  Exercise Goals Re-Evaluation    Row Name 04/04/20 1307 04/13/20 1051 04/27/20 1055         Exercise Goal Re-Evaluation   Exercise Goals Review Understanding of Exercise Prescription;Knowledge and understanding of Target Heart Rate Range (THRR);Able to understand and use rate of perceived exertion (RPE) scale Understanding of Exercise Prescription;Knowledge and understanding of Target Heart Rate Range (THRR);Able to understand and use rate of perceived exertion (RPE) scale;Increase Physical Activity;Increase Strength and Stamina;Able to check pulse independently Understanding of Exercise Prescription;Knowledge and understanding of Target Heart Rate Range (THRR);Able to understand and use rate of perceived exertion (RPE) scale;Increase Physical Activity;Increase Strength and Stamina;Able to check pulse independently     Comments Pt's first day of exercise. Pt able to exercise 30 minutes with no difficulty. Will continue to monitor and progress workloads as tolerated. Reviewed home exercise guidelines with patient including endpoints, temperature precautions, target heart rate and rate of perceived exertion. Patient is exercising daily and  doing well. Patient is currently walking on his treadmill, using his Elliptical machine or Total gym as his mode of home exercise. Patient walks about 1 mile in 25 minutes on his TM at 2.4 mph 1.5% incline. Patient has a pulse oximeter that he uses to monitor his heart rate. Patient has 1,2,5, and 10 lb weights at home. Patient noted that he has some left shoulder weakness when doing the overhead shoulder press. Patient has resumed doing sit-ups at home. I encouraged patient to make sure he discusses with his physician because of sternal precautions, and patient is agreeable to this. Patient will hold sit-ups until talking with his physician. Patient states that he is progressing very well between exercise at cardiac rehab and his home exercise routine. Patient is using  3 lb weights at CR and 5 lb weights at home for his resistance training. Patient is working out daily without any issues.     Expected Outcomes Pt will continue to increase strength and stamina. Patient will continue daily exercise routine to help achieve personal health and fitness goals. Patient will continue daily exercise routine to help build strength and stamina.             Discharge Exercise Prescription (Final Exercise Prescription Changes):  Exercise Prescription Changes - 04/25/20 1048      Response to Exercise   Blood Pressure (Admit) 130/70    Blood Pressure (Exercise) 150/80    Blood Pressure (Exit) 118/78    Heart Rate (Admit) 58 bpm    Heart Rate (Exercise) 96 bpm    Heart Rate (Exit) 58 bpm    Rating of Perceived Exertion (Exercise) 11    Perceived Dyspnea (Exercise) 0    Symptoms None    Duration Progress to 30 minutes of  aerobic without signs/symptoms of physical distress    Intensity THRR unchanged      Progression   Progression Continue to progress workloads to maintain intensity without signs/symptoms of physical distress.    Average METs 3.4      Resistance Training   Training Prescription Yes     Weight 3 lbs    Reps 10-15    Time 10 Minutes      Interval Training   Interval Training No      Treadmill   MPH 2.6    Grade 2    Minutes 15    METs 3.71      NuStep   Level 3    SPM 85    Minutes 15    METs 2.4      Home Exercise Plan   Plans to continue exercise at Home (comment)   Treadmill, elliptical, total gym   Frequency Add 4 additional days to program exercise sessions.    Initial Home Exercises Provided 04/13/20           Nutrition:  Target Goals: Understanding of nutrition guidelines, daily intake of sodium 1500mg , cholesterol 200mg , calories 30% from fat and 7% or less from saturated fats, daily to have 5 or more servings of fruits and vegetables.  Biometrics:  Pre Biometrics - 03/31/20 1018      Pre Biometrics   Height 5' 1.25" (1.556 m)    Weight 70.1 kg    Waist Circumference 39.5 inches    Hip Circumference 40 inches    Waist to Hip Ratio 0.99 %    BMI (Calculated) 28.95    Triceps Skinfold 11 mm    % Body Fat 27.5 %    Grip Strength 30 kg    Flexibility 11 in    Single Leg Stand 30 seconds            Nutrition Therapy Plan and Nutrition Goals:  Nutrition Therapy & Goals - 04/29/20 1117      Nutrition Therapy   Diet Heart Healthy      Personal Nutrition Goals   Nutrition Goal Pt to build a healthy plate including vegetables, fruits, whole grains, and low-fat dairy products in a heart healthy meal plan.      Intervention Plan   Intervention Prescribe, educate and counsel regarding individualized specific dietary modifications aiming towards targeted core components such as weight, hypertension, lipid management, diabetes, heart failure and other comorbidities.    Expected Outcomes Short Term Goal: Understand  basic principles of dietary content, such as calories, fat, sodium, cholesterol and nutrients.           Nutrition Assessments:  Nutrition Assessments - 04/25/20 1422      MEDFICTS Scores   Pre Score 42           MEDIFICTS Score Key:  ?70 Need to make dietary changes   40-70 Heart Healthy Diet  ? 40 Therapeutic Level Cholesterol Diet   Picture Your Plate Scores:  <96 Unhealthy dietary pattern with much room for improvement.  41-50 Dietary pattern unlikely to meet recommendations for good health and room for improvement.  51-60 More healthful dietary pattern, with some room for improvement.   >60 Healthy dietary pattern, although there may be some specific behaviors that could be improved.    Nutrition Goals Re-Evaluation:  Nutrition Goals Re-Evaluation    [place] Name 04/29/20 1118             Goals   Current Weight 155 lb 13.8 oz (70.7 kg)       Nutrition Goal Pt to build a healthy plate including vegetables, fruits, whole grains, and low-fat dairy products in a heart healthy meal plan.              Nutrition Goals Discharge (Final Nutrition Goals Re-Evaluation):  Nutrition Goals Re-Evaluation - 04/29/20 1118      Goals   Current Weight 155 lb 13.8 oz (70.7 kg)    Nutrition Goal Pt to build a healthy plate including vegetables, fruits, whole grains, and low-fat dairy products in a heart healthy meal plan.           Psychosocial: Target Goals: Acknowledge presence or absence of significant depression and/or stress, maximize coping skills, provide positive support system. Participant is able to verbalize types and ability to use techniques and skills needed for reducing stress and depression.  Initial Review & Psychosocial Screening:  Initial Psych Review & Screening - 03/31/20 1123      Initial Review   Current issues with None Identified      Family Dynamics   Good Support System? Yes   Smitty has his wife for support     Barriers   Psychosocial barriers to participate in program There are no identifiable barriers or psychosocial needs.      Screening Interventions   Interventions Encouraged to exercise           Quality of Life Scores:  Quality of Life -  03/31/20 1022      Quality of Life   Select Quality of Life      Quality of Life Scores   Health/Function Pre 26.7 %    Socioeconomic Pre 26.81 %    Psych/Spiritual Pre 30 %    Family Pre 25.2 %    GLOBAL Pre 27.17 %          Scores of 19 and below usually indicate a poorer quality of life in these areas.  A difference of  2-3 points is a clinically meaningful difference.  A difference of 2-3 points in the total score of the Quality of Life Index has been associated with significant improvement in overall quality of life, self-image, physical symptoms, and general health in studies assessing change in quality of life.  PHQ-9: Recent Review Flowsheet Data    Depression screen [facility] 2/9 03/31/2020   Decreased Interest 0   Down, Depressed, Hopeless 0   PHQ - 2 Score 0     Interpretation of Total  Score  Total Score Depression Severity:  1-4 = Minimal depression, 5-9 = Mild depression, 10-14 = Moderate depression, 15-19 = Moderately severe depression, 20-27 = Severe depression   Psychosocial Evaluation and Intervention:   Psychosocial Re-Evaluation:  Psychosocial Re-Evaluation    Row Name 04/04/20 1441 05/03/20 1212           Psychosocial Re-Evaluation   Current issues with None Identified None Identified      Interventions Encouraged to attend Cardiac Rehabilitation for the exercise Encouraged to attend Cardiac Rehabilitation for the exercise      Continue Psychosocial Services  No Follow up required No Follow up required             Psychosocial Discharge (Final Psychosocial Re-Evaluation):  Psychosocial Re-Evaluation - 05/03/20 1212      Psychosocial Re-Evaluation   Current issues with None Identified    Interventions Encouraged to attend Cardiac Rehabilitation for the exercise    Continue Psychosocial Services  No Follow up required           Vocational Rehabilitation: Provide vocational rehab assistance to qualifying candidates.   Vocational Rehab  Evaluation & Intervention:  Vocational Rehab - 03/31/20 1124      Initial Vocational Rehab Evaluation & Intervention   Assessment shows need for Vocational Rehabilitation No   Smitty is semi retired and does not need vocational rehab at this time          Education: Education Goals: Education classes will be provided on a weekly basis, covering required topics. Participant will state understanding/return demonstration of topics presented.  Learning Barriers/Preferences:  Learning Barriers/Preferences - 03/31/20 1026      Learning Barriers/Preferences   Learning Barriers Sight    Learning Preferences Written Material;Verbal Instruction;Group Instruction           Education Topics: Hypertension, Hypertension Reduction -Define heart disease and high blood pressure. Discus how high blood pressure affects the body and ways to reduce high blood pressure.   Exercise and Your Heart -Discuss why it is important to exercise, the FITT principles of exercise, normal and abnormal responses to exercise, and how to exercise safely.   Angina -Discuss definition of angina, causes of angina, treatment of angina, and how to decrease risk of having angina.   Cardiac Medications -Review what the following cardiac medications are used for, how they affect the body, and side effects that may occur when taking the medications.  Medications include Aspirin, Beta blockers, calcium channel blockers, ACE Inhibitors, angiotensin receptor blockers, diuretics, digoxin, and antihyperlipidemics.   Congestive Heart Failure -Discuss the definition of CHF, how to live with CHF, the signs and symptoms of CHF, and how keep track of weight and sodium intake.   Heart Disease and Intimacy -Discus the effect sexual activity has on the heart, how changes occur during intimacy as we age, and safety during sexual activity.   Smoking Cessation / COPD -Discuss different methods to quit smoking, the health  benefits of quitting smoking, and the definition of COPD.   Nutrition I: Fats -Discuss the types of cholesterol, what cholesterol does to the heart, and how cholesterol levels can be controlled.   Nutrition II: Labels -Discuss the different components of food labels and how to read food label   Heart Parts/Heart Disease and PAD -Discuss the anatomy of the heart, the pathway of blood circulation through the heart, and these are affected by heart disease.   Stress I: Signs and Symptoms -Discuss the causes of stress, how stress may lead  to anxiety and depression, and ways to limit stress.   Stress II: Relaxation -Discuss different types of relaxation techniques to limit stress.   Warning Signs of Stroke / TIA -Discuss definition of a stroke, what the signs and symptoms are of a stroke, and how to identify when someone is having stroke.   Knowledge Questionnaire Score:  Knowledge Questionnaire Score - 03/31/20 1022      Knowledge Questionnaire Score   Pre Score 21/24           Core Components/Risk Factors/Patient Goals at Admission:  Personal Goals and Risk Factors at Admission - 03/31/20 1125      Core Components/Risk Factors/Patient Goals on Admission    Weight Management Yes    Intervention Weight Management: Develop a combined nutrition and exercise program designed to reach desired caloric intake, while maintaining appropriate intake of nutrient and fiber, sodium and fats, and appropriate energy expenditure required for the weight goal.;Weight Management: Provide education and appropriate resources to help participant work on and attain dietary goals.    Expected Outcomes Short Term: Continue to assess and modify interventions until short term weight is achieved;Long Term: Adherence to nutrition and physical activity/exercise program aimed toward attainment of established weight goal;Weight Maintenance: Understanding of the daily nutrition guidelines, which includes 25-35%  calories from fat, 7% or less cal from saturated fats, less than 200mg  cholesterol, less than 1.5gm of sodium, & 5 or more servings of fruits and vegetables daily    Hypertension Yes    Intervention Provide education on lifestyle modifcations including regular physical activity/exercise, weight management, moderate sodium restriction and increased consumption of fresh fruit, vegetables, and low fat dairy, alcohol moderation, and smoking cessation.;Monitor prescription use compliance.    Expected Outcomes Short Term: Continued assessment and intervention until BP is < 140/73mm HG in hypertensive participants. < 130/76mm HG in hypertensive participants with diabetes, heart failure or chronic kidney disease.;Long Term: Maintenance of blood pressure at goal levels.    Lipids Yes    Intervention Provide education and support for participant on nutrition & aerobic/resistive exercise along with prescribed medications to achieve LDL 70mg , HDL >40mg .    Expected Outcomes Short Term: Participant states understanding of desired cholesterol values and is compliant with medications prescribed. Participant is following exercise prescription and nutrition guidelines.;Long Term: Cholesterol controlled with medications as prescribed, with individualized exercise RX and with personalized nutrition plan. Value goals: LDL < 70mg , HDL > 40 mg.           Core Components/Risk Factors/Patient Goals Review:   Goals and Risk Factor Review    Row Name 04/04/20 1442 05/03/20 1212           Core Components/Risk Factors/Patient Goals Review   Personal Goals Review Weight Management/Obesity;Lipids;Hypertension Weight Management/Obesity;Lipids;Hypertension      Review Smitty started exercise on 04/04/20. Patient did well. Vital signs were stable Smitty is doing well with exercise. Smitty has an occasional exertional BP noted during exercise. Will continue to monitor BP's      Expected Outcomes Patient will continue to  partcipate in phase 2 cardiac rehab for exercise, nutrtion and lifestyle modifications Patient will continue to partcipate in phase 2 cardiac rehab for exercise, nutrtion and lifestyle modifications             Core Components/Risk Factors/Patient Goals at Discharge (Final Review):   Goals and Risk Factor Review - 05/03/20 1212      Core Components/Risk Factors/Patient Goals Review   Personal Goals Review Weight Management/Obesity;Lipids;Hypertension  Review Smitty is doing well with exercise. Smitty has an occasional exertional BP noted during exercise. Will continue to monitor BP's    Expected Outcomes Patient will continue to partcipate in phase 2 cardiac rehab for exercise, nutrtion and lifestyle modifications           ITP Comments:  ITP Comments    Row Name 03/31/20 0839 04/04/20 1411 05/03/20 1211       ITP Comments Dr Fransico Him MD, Medical Director 30 Day ITP Review. Smitty started exercise on 04/04/20. Patient did well. Vital signs were stable. 30 Day ITP Review. Smitty has good attendance and participaiton in phase 2 cardiac rehab. Smitty is doing well with exercise.            Comments: See ITP comments.Barnet Pall, RN,BSN 05/03/2020 12:15 PM

## 2020-05-04 ENCOUNTER — Encounter: Payer: Self-pay | Admitting: *Deleted

## 2020-05-04 ENCOUNTER — Other Ambulatory Visit: Payer: Self-pay

## 2020-05-04 ENCOUNTER — Encounter (HOSPITAL_COMMUNITY)
Admission: RE | Admit: 2020-05-04 | Discharge: 2020-05-04 | Disposition: A | Discharge status: Home / Self Care | Payer: Medicare Other | Source: Ambulatory Visit | Attending: Interventional Cardiology | Admitting: Interventional Cardiology

## 2020-05-04 DIAGNOSIS — Z951 Presence of aortocoronary bypass graft: Secondary | ICD-10-CM

## 2020-05-04 DIAGNOSIS — I214 Non-ST elevation (NSTEMI) myocardial infarction: Secondary | ICD-10-CM | POA: Diagnosis not present

## 2020-05-04 DIAGNOSIS — Z952 Presence of prosthetic heart valve: Secondary | ICD-10-CM

## 2020-05-06 ENCOUNTER — Encounter (HOSPITAL_COMMUNITY): Payer: Medicare Other

## 2020-05-09 ENCOUNTER — Encounter (HOSPITAL_COMMUNITY)
Admission: RE | Admit: 2020-05-09 | Discharge: 2020-05-09 | Disposition: A | Discharge status: Home / Self Care | Payer: Medicare Other | Source: Ambulatory Visit | Attending: Interventional Cardiology | Admitting: Interventional Cardiology

## 2020-05-09 ENCOUNTER — Other Ambulatory Visit: Payer: Self-pay

## 2020-05-09 DIAGNOSIS — Z951 Presence of aortocoronary bypass graft: Secondary | ICD-10-CM

## 2020-05-09 DIAGNOSIS — Z952 Presence of prosthetic heart valve: Secondary | ICD-10-CM

## 2020-05-09 DIAGNOSIS — I214 Non-ST elevation (NSTEMI) myocardial infarction: Secondary | ICD-10-CM

## 2020-05-11 ENCOUNTER — Other Ambulatory Visit: Payer: Self-pay

## 2020-05-11 ENCOUNTER — Encounter (HOSPITAL_COMMUNITY)
Admission: RE | Admit: 2020-05-11 | Discharge: 2020-05-11 | Disposition: A | Discharge status: Home / Self Care | Payer: Medicare Other | Source: Ambulatory Visit | Attending: Interventional Cardiology | Admitting: Interventional Cardiology

## 2020-05-11 DIAGNOSIS — I214 Non-ST elevation (NSTEMI) myocardial infarction: Secondary | ICD-10-CM | POA: Diagnosis not present

## 2020-05-11 DIAGNOSIS — Z952 Presence of prosthetic heart valve: Secondary | ICD-10-CM | POA: Insufficient documentation

## 2020-05-11 DIAGNOSIS — Z951 Presence of aortocoronary bypass graft: Secondary | ICD-10-CM | POA: Diagnosis present

## 2020-05-13 ENCOUNTER — Other Ambulatory Visit: Payer: Self-pay

## 2020-05-13 ENCOUNTER — Encounter (HOSPITAL_COMMUNITY)
Admission: RE | Admit: 2020-05-13 | Discharge: 2020-05-13 | Disposition: A | Discharge status: Home / Self Care | Payer: Medicare Other | Source: Ambulatory Visit | Attending: Interventional Cardiology | Admitting: Interventional Cardiology

## 2020-05-13 DIAGNOSIS — I214 Non-ST elevation (NSTEMI) myocardial infarction: Secondary | ICD-10-CM | POA: Diagnosis not present

## 2020-05-13 DIAGNOSIS — Z952 Presence of prosthetic heart valve: Secondary | ICD-10-CM

## 2020-05-13 DIAGNOSIS — Z951 Presence of aortocoronary bypass graft: Secondary | ICD-10-CM

## 2020-05-16 ENCOUNTER — Other Ambulatory Visit: Payer: Self-pay

## 2020-05-16 ENCOUNTER — Encounter (HOSPITAL_COMMUNITY)
Admission: RE | Admit: 2020-05-16 | Discharge: 2020-05-16 | Disposition: A | Discharge status: Home / Self Care | Payer: Medicare Other | Source: Ambulatory Visit | Attending: Interventional Cardiology | Admitting: Interventional Cardiology

## 2020-05-16 DIAGNOSIS — Z951 Presence of aortocoronary bypass graft: Secondary | ICD-10-CM

## 2020-05-16 DIAGNOSIS — I214 Non-ST elevation (NSTEMI) myocardial infarction: Secondary | ICD-10-CM

## 2020-05-16 DIAGNOSIS — Z952 Presence of prosthetic heart valve: Secondary | ICD-10-CM

## 2020-05-18 ENCOUNTER — Other Ambulatory Visit: Payer: Self-pay

## 2020-05-18 ENCOUNTER — Encounter (HOSPITAL_COMMUNITY)
Admission: RE | Admit: 2020-05-18 | Discharge: 2020-05-18 | Disposition: A | Discharge status: Home / Self Care | Payer: Medicare Other | Source: Ambulatory Visit | Attending: Interventional Cardiology | Admitting: Interventional Cardiology

## 2020-05-18 DIAGNOSIS — Z952 Presence of prosthetic heart valve: Secondary | ICD-10-CM

## 2020-05-18 DIAGNOSIS — Z951 Presence of aortocoronary bypass graft: Secondary | ICD-10-CM

## 2020-05-18 DIAGNOSIS — I214 Non-ST elevation (NSTEMI) myocardial infarction: Secondary | ICD-10-CM | POA: Diagnosis not present

## 2020-05-20 ENCOUNTER — Encounter (HOSPITAL_COMMUNITY)
Admission: RE | Admit: 2020-05-20 | Discharge: 2020-05-20 | Disposition: A | Discharge status: Home / Self Care | Payer: Medicare Other | Source: Ambulatory Visit | Attending: Interventional Cardiology | Admitting: Interventional Cardiology

## 2020-05-20 ENCOUNTER — Other Ambulatory Visit: Payer: Self-pay

## 2020-05-20 DIAGNOSIS — I214 Non-ST elevation (NSTEMI) myocardial infarction: Secondary | ICD-10-CM

## 2020-05-20 DIAGNOSIS — Z952 Presence of prosthetic heart valve: Secondary | ICD-10-CM

## 2020-05-20 DIAGNOSIS — Z951 Presence of aortocoronary bypass graft: Secondary | ICD-10-CM

## 2020-05-23 ENCOUNTER — Encounter: Payer: Self-pay | Admitting: Thoracic Surgery (Cardiothoracic Vascular Surgery)

## 2020-05-23 ENCOUNTER — Encounter (HOSPITAL_COMMUNITY): Payer: Medicare Other

## 2020-05-23 ENCOUNTER — Other Ambulatory Visit: Payer: Self-pay

## 2020-05-23 ENCOUNTER — Ambulatory Visit (INDEPENDENT_AMBULATORY_CARE_PROVIDER_SITE_OTHER): Payer: Self-pay | Admitting: Thoracic Surgery (Cardiothoracic Vascular Surgery)

## 2020-05-23 VITALS — BP 169/78 | HR 55 | Resp 18 | Ht 61.0 in | Wt 159.0 lb

## 2020-05-23 DIAGNOSIS — Z952 Presence of prosthetic heart valve: Secondary | ICD-10-CM

## 2020-05-23 DIAGNOSIS — Z953 Presence of xenogenic heart valve: Secondary | ICD-10-CM

## 2020-05-23 DIAGNOSIS — Z951 Presence of aortocoronary bypass graft: Secondary | ICD-10-CM

## 2020-05-23 NOTE — Patient Instructions (Addendum)
You may resume unrestricted physical activity without any particular limitations at this time.  Continue all previous medications without any changes at this time  Check your blood pressure on a regular basis and keep a log for your records.  Discussed with your cardiologist and/or primary care physician whether or not your blood pressure medications should be adjusted.  Endocarditis is a potentially serious infection of heart valves or inside lining of the heart.  It occurs more commonly in patients with diseased heart valves (such as patient's with aortic or mitral valve disease) and in patients who have undergone heart valve repair or replacement.  Certain surgical and dental procedures may put you at risk, such as dental cleaning, other dental procedures, or any surgery involving the respiratory, urinary, gastrointestinal tract, gallbladder or prostate gland.   To minimize your chances for develooping endocarditis, maintain good oral health and seek prompt medical attention for any infections involving the mouth, teeth, gums, skin or urinary tract.    Always notify your doctor or dentist about your underlying heart valve condition before having any invasive procedures. You will need to take antibiotics before certain procedures, including all routine dental cleanings or other dental procedures.  Your cardiologist or dentist should prescribe these antibiotics for you to be taken ahead of time.

## 2020-05-23 NOTE — Progress Notes (Signed)
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  The patient's early postoperative recovery was uneventful and he was seen most recently in our office on March 21, 2020 at which time he was doing well.  Since then he underwent routine follow-up echocardiogram April 12, 2020 which revealed normal left ventricular systolic function with ejection fraction estimated 60 to 65%.  There was a bioprosthetic tissue valve in the aortic position that was functioning normally and without any aortic insufficiency.  Mean transvalvular gradient across the aortic valve was reported 5.6 mmHg.  The patient has been seen in follow-up by Dr. Marlou Porch and returns to our office for routine follow-up today. Patient is doing remarkably well. He has nearly completed the cardiac rehab program and beyond that he exercises religiously at home every day. He is back to normal activity and reports no significant limitations. His chest has healed up nicely and he no longer has any pain. He denies any exertional shortness of breath and overall feels much better than he did prior to surgery. He is delighted with his recovery.   Current Outpatient Medications  Medication Sig Dispense Refill  . acetaminophen (TYLENOL) 500  MG tablet Take 500 mg by mouth every 6 (six) hours as needed for mild pain or moderate pain.    Marland Kitchen aspirin EC 81 MG tablet Take 81 mg by mouth daily.     Marland Kitchen atorvastatin (LIPITOR) 80 MG tablet Take 1 tablet (80 mg total) by mouth every evening. 30 tablet 1  . B Complex Vitamins (VITAMIN B-COMPLEX) TABS Take 1 tablet by mouth daily.     . carvedilol (COREG) 3.125 MG tablet Take 1 tablet (3.125 mg total) by mouth 2 (two) times daily with a meal. 60 tablet 1  . cholecalciferol (VITAMIN D3) 25 MCG (1000 UNIT) tablet Take 1,000 Units by mouth daily.    . clopidogrel (PLAVIX) 75 MG tablet Take 75 mg by mouth daily.    . Coenzyme Q10 (CO Q-10) 200 MG CAPS Take 1 tablet by mouth daily.    Marland Kitchen glucosamine-chondroitin 500-400 MG tablet Take 1 tablet by mouth daily.    Marland Kitchen losartan (COZAAR) 100 MG tablet Take 1 tablet (100 mg total) by mouth daily. 90 tablet 3  . Multiple Vitamin (MULTIVITAMIN WITH MINERALS) TABS tablet Take 1 tablet by mouth daily.    Marland Kitchen RA KRILL OIL 500 MG CAPS Take by mouth.    . Turmeric 500 MG CAPS Take 1 tablet by mouth daily.    . Zinc 30 MG CAPS Take 1 capsule by mouth daily.     No current facility-administered medications for this visit.      Physical Exam:   BP (!) 169/78 (BP Location: Right Arm, Patient Position: Sitting)   Pulse (!) 55  Resp 18   Ht 5\' 1"  (1.549 m)   Wt 159 lb (72.1 kg)   SpO2 98% Comment: RA with mask on  BMI 30.04 kg/m   General:  Well-appearing  Chest:   Clear to auscultation with symmetrical breath sounds  CV:   Regular rate and rhythm without murmur  Incisions:  Completely healed, sternum stable  Abdomen:  Soft nontender  Extremities:  Warm and well-perfused  Diagnostic Tests:  ECHOCARDIOGRAM REPORT       Patient Name:  Alan Mcdonald  Date of Exam: 04/12/2020  Medical Rec #: [id number]   Height:    61.3 in  Accession #:  [id number]  Weight:    154.5 lb  Date of Birth: May 16, 1951   BSA:     1.698 m  Patient Age:   69 years   BP:      134/70 mmHg  Patient Gender: M       HR:      62 bpm.  Exam Location: [place]   Procedure: 2D Echo, Cardiac Doppler and Color Doppler   Indications:  I35.9 Aortic Valve Disorder    History:    Patient has prior history of Echocardiogram examinations,  most         recent 02/22/2020. Prior CABG and Aortic Valve  Replacement-72mm         Edwards Inspiris Bovine Pericardial Tissue Valve; Risk         Factors:Hypertension and Dyslipidemia. Coronary artery  disease.    Sonographer:  Cresenciano Lick RDCS  Referring Phys: [id number] HAO MENG   IMPRESSIONS    1. Left ventricular ejection fraction, by estimation, is 60 to 65%. The  left ventricle has normal function. The left ventricle has no regional  wall motion abnormalities. Left ventricular diastolic parameters are  consistent with Grade I diastolic  dysfunction (impaired relaxation). Elevated left ventricular end-diastolic  pressure.  2. Right ventricular systolic function is normal. The right ventricular  size is normal. There is normal pulmonary artery systolic pressure.  3. The mitral valve is normal in structure. Trivial mitral valve  regurgitation. No evidence of mitral stenosis.  4. The aortic valve has been repaired/replaced. Aortic valve  regurgitation is not visualized. No aortic stenosis is present. Echo  findings are consistent with normal structure and function of the aortic  valve prosthesis. Aortic valve mean gradient  measures 5.6 mmHg. Aortic valve Vmax measures 1.52 m/s.  5. The inferior vena cava is normal in size with greater than 50%  respiratory variability, suggesting right atrial pressure of 3 mmHg.   FINDINGS  Left Ventricle: Left ventricular ejection fraction, by estimation, is 60  to 65%. The left ventricle has normal function. The left ventricle has no  regional wall motion abnormalities. The left ventricular  internal cavity  size was normal in size. There is  no left ventricular hypertrophy. Left ventricular diastolic parameters  are consistent with Grade I diastolic dysfunction (impaired relaxation).  Elevated left ventricular end-diastolic pressure.   Right Ventricle: The right ventricular size is normal. No increase in  right ventricular wall thickness. Right ventricular systolic function is  normal. There is normal pulmonary artery systolic pressure. The tricuspid  regurgitant velocity is 2.14 m/s, and  with an assumed right atrial pressure of 3 mmHg, the estimated right  ventricular systolic pressure is 15.0 mmHg.   Left Atrium: Left atrial size was normal in size.   Right Atrium: Right atrial size was normal in size.   Pericardium: There is no  evidence of pericardial effusion.   Mitral Valve: The mitral valve is normal in structure. Mild mitral annular  calcification. Trivial mitral valve regurgitation. No evidence of mitral  valve stenosis.   Tricuspid Valve: The tricuspid valve is normal in structure. Tricuspid  valve regurgitation is mild . No evidence of tricuspid stenosis.   Aortic Valve: The aortic valve has been repaired/replaced. Aortic valve  regurgitation is not visualized. No aortic stenosis is present. Aortic  valve mean gradient measures 5.6 mmHg. Aortic valve peak gradient measures  9.2 mmHg. There is a 25 mm Magna  Edwards Inspiris Resilia bovine pericardial tissue valve present in the  aortic position. Echo findings are consistent with normal structure and  function of the aortic valve prosthesis.   Pulmonic Valve: The pulmonic valve was normal in structure. Pulmonic valve  regurgitation is not visualized. No evidence of pulmonic stenosis.   Aorta: The aortic root is normal in size and structure.   Venous: The inferior vena cava is normal in size with greater than 50%  respiratory variability, suggesting right atrial pressure of 3 mmHg.   IAS/Shunts: No  atrial level shunt detected by color flow Doppler.     LEFT VENTRICLE  PLAX 2D  LVIDd:     4.60 cm Diastology  LVIDs:     2.80 cm LV e' medial:  6.22 cm/s  LV PW:     0.90 cm LV E/e' medial: 16.9  LV IVS:    0.90 cm LV e' lateral:  9.95 cm/s             LV E/e' lateral: 10.6     RIGHT VENTRICLE  RV Basal diam: 4.30 cm  RV S prime:   11.10 cm/s  TAPSE (M-mode): 1.4 cm   LEFT ATRIUM       Index    RIGHT ATRIUM      Index  LA diam:    4.50 cm 2.65 cm/m RA Area:   11.90 cm  LA Vol (A2C):  38.3 ml 22.55 ml/m RA Volume:  27.90 ml 16.43 ml/m  LA Vol (A4C):  43.0 ml 25.32 ml/m  LA Biplane Vol: 42.2 ml 24.85 ml/m  AORTIC VALVE  AV Vmax:      152.00 cm/s  AV Vmean:     111.200 cm/s  AV VTI:      0.346 m  AV Peak Grad:   9.2 mmHg  AV Mean Grad:   5.6 mmHg  LVOT Vmax:     94.90 cm/s  LVOT Vmean:    72.850 cm/s  LVOT VTI:     0.239 m  LVOT/AV VTI ratio: 0.69    AORTA  Ao Root diam: 3.00 cm   MITRAL VALVE        TRICUSPID VALVE  MV Area (PHT): 3.08 cm   TR Peak grad:  18.3 mmHg  MV Decel Time: 246 msec   TR Vmax:    214.00 cm/s  MV E velocity: 105.00 cm/s  MV A velocity: 113.00 cm/s SHUNTS  MV E/A ratio: 0.93     Systemic VTI: 0.24 m   Skeet Latch MD  Electronically signed by Skeet Latch MD  Signature Date/Time: 04/12/2020/1:33:41 PM      Impression:  Patient is doing remarkably well approximately 3 months status post aortic valve replacement and coronary artery bypass grafting  Plan:  We have not recommended any change the patient's current medications. I have encouraged the patient to continue to gradually increase his physical activity as tolerated without any  particular limitations. I have suggested that the patient check his blood pressure regularly and keep track so that he can review how it is running at home when he speaks with Dr.  Marlou Porch at his next office visit. It is possible that he may need further adjustment in his medications for hypertension.  The patient has been reminded regarding the importance of dental hygiene and the lifelong need for antibiotic prophylaxis for all dental cleanings and other related invasive procedures. The patient will continue to follow-up regularly with Dr. Marlou Porch and return to our office next fall for routine follow-up approximately 1 year following his surgery. During the interim he will call and return only should specific problems or questions arise.    Valentina Gu. Roxy Manns, MD 05/23/2020 10:34 AM

## 2020-05-24 NOTE — Progress Notes (Signed)
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  acetaminophen (TYLENOL) 500 MG tablet, Take 500 mg by mouth every 6 (six) hours as needed for mild pain or moderate pain., Disp: , Rfl:  .  aspirin EC 81 MG tablet, Take 81 mg by mouth daily. , Disp: , Rfl:  .  atorvastatin (LIPITOR) 80 MG tablet, Take 1 tablet (80 mg total) by mouth every evening., Disp: 30 tablet, Rfl: 1 .  B Complex Vitamins (VITAMIN B-COMPLEX) TABS, Take 1 tablet by mouth daily. , Disp: , Rfl:  .  carvedilol (COREG) 3.125 MG tablet, Take 1 tablet (3.125 mg total) by mouth 2 (two) times daily with a meal., Disp: 60 tablet, Rfl: 1 .  cholecalciferol (VITAMIN D3) 25 MCG (1000 UNIT) tablet, Take 1,000 Units by mouth daily., Disp: , Rfl:  .  clopidogrel (PLAVIX) 75 MG tablet, Take 75 mg by mouth daily., Disp: , Rfl:  .  Coenzyme Q10 (CO Q-10) 200 MG CAPS, Take 1 tablet by mouth daily., Disp: , Rfl:  .  glucosamine-chondroitin 500-400 MG tablet, Take 1 tablet by mouth daily., Disp: , Rfl:  .  losartan (COZAAR) 100 MG tablet, Take 1 tablet (100 mg total) by mouth daily., Disp: 90 tablet, Rfl: 3 .  Multiple Vitamin (MULTIVITAMIN WITH MINERALS) TABS tablet, Take 1 tablet by mouth daily., Disp: , Rfl:  .  RA KRILL OIL 500 MG CAPS, Take by mouth., Disp: , Rfl:  .  Turmeric  500 MG CAPS, Take 1 tablet by mouth daily., Disp: , Rfl:  .  Zinc 30 MG CAPS, Take 1 capsule by mouth daily., Disp: , Rfl:   Past Medical History: Past Medical History:  Diagnosis Date  . Aortic stenosis 02/22/2020  . Coronary artery disease   . High cholesterol   . History of lymphoma   . Hypertension   . S/P aortic valve replacement with bioprosthetic valve 02/22/2020   [facility] Inspiris Resilia stented bovine pericardial tissue valve, size 25 mm  . 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 Types: Cigarettes  . Quit date: 2003  . Years since quitting: 18.9  Smokeless Tobacco Never Used    Labs: Recent Review Flowsheet Data    Labs for ITP Cardiac and Pulmonary Rehab Latest Ref Rng & Units 02/22/2020 02/23/2020 02/23/2020 02/23/2020 04/28/2020   Cholestrol 100 - 199 mg/dL - - - - 131   LDLCALC 0 - 99 mg/dL - - - - 69   HDL >39 mg/dL - - - - 36(L)   Trlycerides 0 - 149 mg/dL - - - - 152(H)   Hemoglobin A1c 4.8 - 5.6 % - - - - -   PHART 7.350 - 7.450 7.295(L) 7.325(L) 7.367 7.364 -  PCO2ART 32.0 - 48.0 mmHg 47.5 44.8 39.0 36.8 -   HCO3 20.0 - 28.0 mmol/L 23.3 23.4 22.5 21.0 -   TCO2 22 - 32 mmol/L 25 25 24 22  -   ACIDBASEDEF 0.0 - 2.0 mmol/L 4.0(H) 3.0(H) 3.0(H) 4.0(H) -   O2SAT % 91.0 97.0 96.0 94.0 -      Capillary Blood Glucose: Lab Results  Component Value Date   GLUCAP 153 (H) 02/24/2020   GLUCAP 138 (H) 02/24/2020   GLUCAP 149 (H) 02/23/2020   GLUCAP 147 (H) 02/23/2020   GLUCAP 163 (H) 02/23/2020     Exercise Target Goals: Exercise Program Goal: Individual exercise prescription set using results from initial 6 min walk test and THRR while considering  patient's activity barriers and safety.   Exercise Prescription Goal: Starting with aerobic activity 30 plus minutes a day, 3 days per week for initial exercise prescription. Provide home exercise prescription and guidelines  that participant acknowledges understanding prior to discharge.  Activity Barriers & Risk Stratification:  Activity Barriers & Cardiac Risk Stratification - 03/31/20 1017      Activity Barriers & Cardiac Risk Stratification   Activity Barriers Incisional Pain    Cardiac Risk Stratification High           6 Minute Walk:  6 Minute Walk    Row Name 03/31/20 1016 05/20/20 1048       6 Minute Walk   Phase Initial Discharge    Distance 1490 feet 1865 feet    Distance % Change -- 25.17 %    Distance Feet Change -- 375 ft    Walk Time 6 minutes 6 minutes    # of Rest Breaks 0 0    MPH 2.8 3.53    METS 2.9 3.98    RPE 12 13.92    Perceived Dyspnea  0 0    VO2 Peak 10.4 --    Symptoms No No    Resting HR 71 bpm 71 bpm    Resting BP 134/70 136/76    Resting Oxygen Saturation  97 % --    Exercise Oxygen Saturation  during 6 min walk 97 % --    Max Ex. HR 85 bpm 91 bpm    Max Ex. BP 140/80 182/80    2 Minute Post BP 130/68 --           Oxygen Initial Assessment:   Oxygen Re-Evaluation:   Oxygen Discharge (Final Oxygen Re-Evaluation):   Initial Exercise Prescription:  Initial Exercise Prescription - 04/04/20 1300      Date of Initial Exercise RX and Referring Provider   Referring Provider Latricia Heft MD, [facility]/ Dr Fransico Him Covering           Perform Capillary Blood Glucose checks as needed.  Exercise Prescription Changes:  Exercise Prescription Changes    Row Name 04/04/20 1300 04/13/20 1050 04/25/20 1048 05/09/20 1051       Response to Exercise   Blood Pressure (Admit) 130/68 124/60 130/70 152/68    Blood Pressure (Exercise) 144/74 154/80 150/80 144/76    Blood Pressure (Exit) 142/70 124/68 118/78 128/78    Heart Rate (Admit) 62 bpm 75 bpm 58 bpm 69 bpm    Heart Rate (Exercise) 97 bpm 87 bpm 96 bpm 92 bpm    Heart Rate (Exit) 69 bpm 63 bpm 58 bpm 67 bpm    Rating of Perceived Exertion (Exercise) 12 9 11 12     Perceived Dyspnea  (Exercise) 0 0  0 0    Symptoms None None None None    Comments Pt's first day of exercise -- -- --    Duration Progress to 30 minutes of  aerobic without signs/symptoms of physical distress Progress to 30 minutes of  aerobic without signs/symptoms of physical distress Progress to 30 minutes of  aerobic without signs/symptoms of physical distress Progress to 30 minutes of  aerobic without signs/symptoms of physical distress    Intensity THRR unchanged THRR unchanged THRR unchanged THRR unchanged         Progression   Progression Continue to progress workloads to maintain intensity without signs/symptoms of physical distress. Continue to progress workloads to maintain intensity without signs/symptoms of physical distress. Continue to progress workloads to maintain intensity without signs/symptoms of physical distress. Continue to progress workloads to maintain intensity without signs/symptoms of physical distress.    Average METs 2.8 2.8 3.4 3.5         Resistance Training   Training Prescription Yes No Yes Yes    Weight 2lbs -- 3 lbs 4 lbs    Reps 10-15 -- 10-15 10-15    Time 10 Minutes -- 10 Minutes 10 Minutes         Interval Training   Interval Training No No No No         Treadmill   MPH 2.4 2.4 2.6 2.6    Grade 1 1 2 2     Minutes 15 15 15 15     METs 3.17 3.17 3.71 3.71         NuStep   Level 2 3 3 3     SPM 85 85 85 85    Minutes 15 15 15 15     METs 2.4 2.4 2.4 3.3         Home Exercise Plan   Plans to continue exercise at -- Home (comment)  Treadmill, elliptical, total gym Home (comment)  Treadmill, elliptical, total gym Home (comment)  Treadmill, elliptical, total gym    Frequency -- Add 4 additional days to program exercise sessions. Add 4 additional days to program exercise sessions. Add 4 additional days to program exercise sessions.    Initial Home Exercises Provided -- 04/13/20 04/13/20 04/13/20           Exercise Comments:  Exercise Comments    Row Name  04/04/20 1307 04/13/20 1051 04/27/20 1055       Exercise Comments Pt's first day of exercise. Pt oriented to equipment. Pt responded well to prescription. Will continue to monitor. Reviewed home exercise guidelines and goals with patient. Reviewed METs and goals with patient.            Exercise Goals and Review:  Exercise Goals    Row Name 03/31/20 1017             Exercise Goals   Increase Physical Activity Yes       Intervention Provide advice, education, support and counseling about physical activity/exercise needs.;Develop an individualized exercise prescription for aerobic and resistive training based on initial evaluation findings, risk stratification, comorbidities and participant's personal goals.       Expected Outcomes Short Term: Attend rehab on a regular basis to increase amount of physical activity.;Long Term: Add in home exercise to make exercise part of routine and to increase amount of physical activity.;Long Term: Exercising regularly at least 3-5 days a week.       Increase Strength and Stamina Yes       Intervention Provide advice, education,  support and counseling about physical activity/exercise needs.;Develop an individualized exercise prescription for aerobic and resistive training based on initial evaluation findings, risk stratification, comorbidities and participant's personal goals.       Expected Outcomes Short Term: Increase workloads from initial exercise prescription for resistance, speed, and METs.;Short Term: Perform resistance training exercises routinely during rehab and add in resistance training at home;Long Term: Improve cardiorespiratory fitness, muscular endurance and strength as measured by increased METs and functional capacity (6MWT)       Able to understand and use rate of perceived exertion (RPE) scale Yes       Intervention Provide education and explanation on how to use RPE scale       Expected Outcomes Short Term: Able to use RPE daily in  rehab to express subjective intensity level;Long Term:  Able to use RPE to guide intensity level when exercising independently       Knowledge and understanding of Target Heart Rate Range (THRR) Yes       Intervention Provide education and explanation of THRR including how the numbers were predicted and where they are located for reference       Expected Outcomes Short Term: Able to state/look up THRR;Long Term: Able to use THRR to govern intensity when exercising independently;Short Term: Able to use daily as guideline for intensity in rehab       Able to check pulse independently Yes       Intervention Provide education and demonstration on how to check pulse in carotid and radial arteries.;Review the importance of being able to check your own pulse for safety during independent exercise       Expected Outcomes Short Term: Able to explain why pulse checking is important during independent exercise;Long Term: Able to check pulse independently and accurately       Understanding of Exercise Prescription Yes       Intervention Provide education, explanation, and written materials on patient's individual exercise prescription       Expected Outcomes Short Term: Able to explain program exercise prescription;Long Term: Able to explain home exercise prescription to exercise independently              Exercise Goals Re-Evaluation :  Exercise Goals Re-Evaluation    Row Name 04/04/20 1307 04/13/20 1051 04/27/20 1055 05/10/20 1148 05/18/20 1115     Exercise Goal Re-Evaluation   Exercise Goals Review Understanding of Exercise Prescription;Knowledge and understanding of Target Heart Rate Range (THRR);Able to understand and use rate of perceived exertion (RPE) scale Understanding of Exercise Prescription;Knowledge and understanding of Target Heart Rate Range (THRR);Able to understand and use rate of perceived exertion (RPE) scale;Increase Physical Activity;Increase Strength and Stamina;Able to check pulse  independently Understanding of Exercise Prescription;Knowledge and understanding of Target Heart Rate Range (THRR);Able to understand and use rate of perceived exertion (RPE) scale;Increase Physical Activity;Increase Strength and Stamina;Able to check pulse independently Understanding of Exercise Prescription;Knowledge and understanding of Target Heart Rate Range (THRR);Able to understand and use rate of perceived exertion (RPE) scale;Increase Physical Activity;Increase Strength and Stamina;Able to check pulse independently Understanding of Exercise Prescription;Knowledge and understanding of Target Heart Rate Range (THRR);Able to understand and use rate of perceived exertion (RPE) scale;Increase Physical Activity;Increase Strength and Stamina;Able to check pulse independently   Comments Pt's first day of exercise. Pt able to exercise 30 minutes with no difficulty. Will continue to monitor and progress workloads as tolerated. Reviewed home exercise guidelines with patient including endpoints, temperature precautions, target heart rate and rate of  perceived exertion. Patient is exercising daily and doing well. Patient is currently walking on his treadmill, using his Elliptical machine or Total gym as his mode of home exercise. Patient walks about 1 mile in 25 minutes on his TM at 2.4 mph 1.5% incline. Patient has a pulse oximeter that he uses to monitor his heart rate. Patient has 1,2,5, and 10 lb weights at home. Patient noted that he has some left shoulder weakness when doing the overhead shoulder press. Patient has resumed doing sit-ups at home. I encouraged patient to make sure he discusses with his physician because of sternal precautions, and patient is agreeable to this. Patient will hold sit-ups until talking with his physician. Patient states that he is progressing very well between exercise at cardiac rehab and his home exercise routine. Patient is using 3 lb weights at CR and 5 lb weights at home for his  resistance training. Patient is working out daily without any issues. Patient continues to progess well with exercise. Increased hand weights from 3 to 4 lbs. Patient continues to progress well with exercise. Patient states that he started back using his elliptical at home yeesterday and tolerated well. Patient exercised 15 minutes on his elliptical and 15 minutes on his treadmill.   Expected Outcomes Pt will continue to increase strength and stamina. Patient will continue daily exercise routine to help achieve personal health and fitness goals. Patient will continue daily exercise routine to help build strength and stamina. Increase workloads as tolerated to help improve cardiorespiratory fitness. Patient will continue home exericse routine in addition to exercise at cardiac rehab to help improve functional capacity.   [place] Name 05/20/20 1100             Exercise Goal Re-Evaluation   Exercise Goals Review Understanding of Exercise Prescription;Knowledge and understanding of Target Heart Rate Range (THRR);Able to understand and use rate of perceived exertion (RPE) scale;Increase Physical Activity;Increase Strength and Stamina;Able to check pulse independently       Comments Patient scheduled to complete the cardiac rehab next week. Functional capacity increased 25% as measured by 6-minute walk test, strength increased 5% as measured by grip strength test.       Expected Outcomes Patient will continue home exericse routine to help maintain health and fitness gains.               Discharge Exercise Prescription (Final Exercise Prescription Changes):  Exercise Prescription Changes - 05/09/20 1051      Response to Exercise   Blood Pressure (Admit) 152/68    Blood Pressure (Exercise) 144/76    Blood Pressure (Exit) 128/78    Heart Rate (Admit) 69 bpm    Heart Rate (Exercise) 92 bpm    Heart Rate (Exit) 67 bpm    Rating of Perceived Exertion (Exercise) 12    Perceived Dyspnea (Exercise) 0     Symptoms None    Duration Progress to 30 minutes of  aerobic without signs/symptoms of physical distress    Intensity THRR unchanged      Progression   Progression Continue to progress workloads to maintain intensity without signs/symptoms of physical distress.    Average METs 3.5      Resistance Training   Training Prescription Yes    Weight 4 lbs    Reps 10-15    Time 10 Minutes      Interval Training   Interval Training No      Treadmill   MPH 2.6    Grade 2  Minutes 15    METs 3.71      NuStep   Level 3    SPM 85    Minutes 15    METs 3.3      Home Exercise Plan   Plans to continue exercise at Home (comment)   Treadmill, elliptical, total gym   Frequency Add 4 additional days to program exercise sessions.    Initial Home Exercises Provided 04/13/20           Nutrition:  Target Goals: Understanding of nutrition guidelines, daily intake of sodium 1500mg , cholesterol 200mg , calories 30% from fat and 7% or less from saturated fats, daily to have 5 or more servings of fruits and vegetables.  Biometrics:  Pre Biometrics - 03/31/20 1018      Pre Biometrics   Height 5' 1.25" (1.556 m)    Weight 70.1 kg    Waist Circumference 39.5 inches    Hip Circumference 40 inches    Waist to Hip Ratio 0.99 %    BMI (Calculated) 28.95    Triceps Skinfold 11 mm    % Body Fat 27.5 %    Grip Strength 30 kg    Flexibility 11 in    Single Leg Stand 30 seconds           Post Biometrics - 05/20/20 1100       Post  Biometrics   Waist Circumference 38.5 inches    Hip Circumference 39 inches    Waist to Hip Ratio 0.99 %    Triceps Skinfold 8 mm    Grip Strength 31.5 kg    Flexibility 10.25 in    Single Leg Stand 30 seconds           Nutrition Therapy Plan and Nutrition Goals:  Nutrition Therapy & Goals - 04/29/20 1117      Nutrition Therapy   Diet Heart Healthy      Personal Nutrition Goals   Nutrition Goal Pt to build a healthy plate including vegetables,  fruits, whole grains, and low-fat dairy products in a heart healthy meal plan.      Intervention Plan   Intervention Prescribe, educate and counsel regarding individualized specific dietary modifications aiming towards targeted core components such as weight, hypertension, lipid management, diabetes, heart failure and other comorbidities.    Expected Outcomes Short Term Goal: Understand basic principles of dietary content, such as calories, fat, sodium, cholesterol and nutrients.           Nutrition Assessments:  Nutrition Assessments - 04/25/20 1422      MEDFICTS Scores   Pre Score 42          MEDIFICTS Score Key:  ?70 Need to make dietary changes   40-70 Heart Healthy Diet  ? 40 Therapeutic Level Cholesterol Diet   Picture Your Plate Scores:  <76 Unhealthy dietary pattern with much room for improvement.  41-50 Dietary pattern unlikely to meet recommendations for good health and room for improvement.  51-60 More healthful dietary pattern, with some room for improvement.   >60 Healthy dietary pattern, although there may be some specific behaviors that could be improved.    Nutrition Goals Re-Evaluation:  Nutrition Goals Re-Evaluation    [place] Name 04/29/20 1118 05/24/20 0842           Goals   Current Weight 155 lb 13.8 oz (70.7 kg) 157 lb 6.5 oz (71.4 kg)      Nutrition Goal Pt to build a healthy plate including vegetables, fruits, whole  grains, and low-fat dairy products in a heart healthy meal plan. Pt to build a healthy plate including vegetables, fruits, whole grains, and low-fat dairy products in a heart healthy meal plan.      Comment -- Provided heart healthy diet education. Pt verbalizes continuing to follow a heart healthy diet.             Nutrition Goals Discharge (Final Nutrition Goals Re-Evaluation):  Nutrition Goals Re-Evaluation - 05/24/20 0842      Goals   Current Weight 157 lb 6.5 oz (71.4 kg)    Nutrition Goal Pt to build a healthy plate  including vegetables, fruits, whole grains, and low-fat dairy products in a heart healthy meal plan.    Comment Provided heart healthy diet education. Pt verbalizes continuing to follow a heart healthy diet.           Psychosocial: Target Goals: Acknowledge presence or absence of significant depression and/or stress, maximize coping skills, provide positive support system. Participant is able to verbalize types and ability to use techniques and skills needed for reducing stress and depression.  Initial Review & Psychosocial Screening:  Initial Psych Review & Screening - 03/31/20 1123      Initial Review   Current issues with None Identified      Family Dynamics   Good Support System? Yes   Smitty has his wife for support     Barriers   Psychosocial barriers to participate in program There are no identifiable barriers or psychosocial needs.      Screening Interventions   Interventions Encouraged to exercise           Quality of Life Scores:  Quality of Life - 05/20/20 1248      Quality of Life   Select Quality of Life      Quality of Life Scores   Health/Function Pre 26.7 %    Health/Function Post 29.2 %    Health/Function % Change 9.36 %    Socioeconomic Pre 26.81 %    Socioeconomic Post 28.63 %    Socioeconomic % Change  6.79 %    Psych/Spiritual Pre 30 %    Psych/Spiritual Post 30 %    Psych/Spiritual % Change 0 %    Family Pre 25.2 %    Family Post 30 %    Family % Change 19.05 %    GLOBAL Pre 27.17 %    GLOBAL Post 29.34 %    GLOBAL % Change 7.99 %          Scores of 19 and below usually indicate a poorer quality of life in these areas.  A difference of  2-3 points is a clinically meaningful difference.  A difference of 2-3 points in the total score of the Quality of Life Index has been associated with significant improvement in overall quality of life, self-image, physical symptoms, and general health in studies assessing change in quality of  life.  PHQ-9: Recent Review Flowsheet Data    Depression screen [facility] 2/9 03/31/2020   Decreased Interest 0   Down, Depressed, Hopeless 0   PHQ - 2 Score 0     Interpretation of Total Score  Total Score Depression Severity:  1-4 = Minimal depression, 5-9 = Mild depression, 10-14 = Moderate depression, 15-19 = Moderately severe depression, 20-27 = Severe depression   Psychosocial Evaluation and Intervention:   Psychosocial Re-Evaluation:  Psychosocial Re-Evaluation    [place] Name 04/04/20 1441 05/03/20 1212 05/24/20 1357  Psychosocial Re-Evaluation   Current issues with None Identified None Identified None Identified     Interventions Encouraged to attend Cardiac Rehabilitation for the exercise Encouraged to attend Cardiac Rehabilitation for the exercise Encouraged to attend Cardiac Rehabilitation for the exercise     Continue Psychosocial Services  No Follow up required No Follow up required No Follow up required            Psychosocial Discharge (Final Psychosocial Re-Evaluation):  Psychosocial Re-Evaluation - 05/24/20 1357      Psychosocial Re-Evaluation   Current issues with None Identified    Interventions Encouraged to attend Cardiac Rehabilitation for the exercise    Continue Psychosocial Services  No Follow up required           Vocational Rehabilitation: Provide vocational rehab assistance to qualifying candidates.   Vocational Rehab Evaluation & Intervention:  Vocational Rehab - 03/31/20 1124      Initial Vocational Rehab Evaluation & Intervention   Assessment shows need for Vocational Rehabilitation No   Smitty is semi retired and does not need vocational rehab at this time          Education: Education Goals: Education classes will be provided on a weekly basis, covering required topics. Participant will state understanding/return demonstration of topics presented.  Learning Barriers/Preferences:  Learning Barriers/Preferences - 03/31/20 1026       Learning Barriers/Preferences   Learning Barriers Sight    Learning Preferences Written Material;Verbal Instruction;Group Instruction           Education Topics: Hypertension, Hypertension Reduction -Define heart disease and high blood pressure. Discus how high blood pressure affects the body and ways to reduce high blood pressure.   Exercise and Your Heart -Discuss why it is important to exercise, the FITT principles of exercise, normal and abnormal responses to exercise, and how to exercise safely.   Angina -Discuss definition of angina, causes of angina, treatment of angina, and how to decrease risk of having angina.   Cardiac Medications -Review what the following cardiac medications are used for, how they affect the body, and side effects that may occur when taking the medications.  Medications include Aspirin, Beta blockers, calcium channel blockers, ACE Inhibitors, angiotensin receptor blockers, diuretics, digoxin, and antihyperlipidemics.   Congestive Heart Failure -Discuss the definition of CHF, how to live with CHF, the signs and symptoms of CHF, and how keep track of weight and sodium intake.   Heart Disease and Intimacy -Discus the effect sexual activity has on the heart, how changes occur during intimacy as we age, and safety during sexual activity.   Smoking Cessation / COPD -Discuss different methods to quit smoking, the health benefits of quitting smoking, and the definition of COPD.   Nutrition I: Fats -Discuss the types of cholesterol, what cholesterol does to the heart, and how cholesterol levels can be controlled.   Nutrition II: Labels -Discuss the different components of food labels and how to read food label   Heart Parts/Heart Disease and PAD -Discuss the anatomy of the heart, the pathway of blood circulation through the heart, and these are affected by heart disease.   Stress I: Signs and Symptoms -Discuss the causes of stress, how  stress may lead to anxiety and depression, and ways to limit stress.   Stress II: Relaxation -Discuss different types of relaxation techniques to limit stress.   Warning Signs of Stroke / TIA -Discuss definition of a stroke, what the signs and symptoms are of a stroke, and how to identify  when someone is having stroke.   Knowledge Questionnaire Score:  Knowledge Questionnaire Score - 05/20/20 1249      Knowledge Questionnaire Score   Pre Score 21/24    Post Score 22/24           Core Components/Risk Factors/Patient Goals at Admission:  Personal Goals and Risk Factors at Admission - 03/31/20 1125      Core Components/Risk Factors/Patient Goals on Admission    Weight Management Yes    Intervention Weight Management: Develop a combined nutrition and exercise program designed to reach desired caloric intake, while maintaining appropriate intake of nutrient and fiber, sodium and fats, and appropriate energy expenditure required for the weight goal.;Weight Management: Provide education and appropriate resources to help participant work on and attain dietary goals.    Expected Outcomes Short Term: Continue to assess and modify interventions until short term weight is achieved;Long Term: Adherence to nutrition and physical activity/exercise program aimed toward attainment of established weight goal;Weight Maintenance: Understanding of the daily nutrition guidelines, which includes 25-35% calories from fat, 7% or less cal from saturated fats, less than 200mg  cholesterol, less than 1.5gm of sodium, & 5 or more servings of fruits and vegetables daily    Hypertension Yes    Intervention Provide education on lifestyle modifcations including regular physical activity/exercise, weight management, moderate sodium restriction and increased consumption of fresh fruit, vegetables, and low fat dairy, alcohol moderation, and smoking cessation.;Monitor prescription use compliance.    Expected Outcomes Short  Term: Continued assessment and intervention until BP is < 140/65mm HG in hypertensive participants. < 130/39mm HG in hypertensive participants with diabetes, heart failure or chronic kidney disease.;Long Term: Maintenance of blood pressure at goal levels.    Lipids Yes    Intervention Provide education and support for participant on nutrition & aerobic/resistive exercise along with prescribed medications to achieve LDL 70mg , HDL >40mg .    Expected Outcomes Short Term: Participant states understanding of desired cholesterol values and is compliant with medications prescribed. Participant is following exercise prescription and nutrition guidelines.;Long Term: Cholesterol controlled with medications as prescribed, with individualized exercise RX and with personalized nutrition plan. Value goals: LDL < 70mg , HDL > 40 mg.           Core Components/Risk Factors/Patient Goals Review:   Goals and Risk Factor Review    Row Name 04/04/20 1442 05/03/20 1212 05/24/20 1357         Core Components/Risk Factors/Patient Goals Review   Personal Goals Review Weight Management/Obesity;Lipids;Hypertension Weight Management/Obesity;Lipids;Hypertension Weight Management/Obesity;Lipids;Hypertension     Review Smitty started exercise on 04/04/20. Patient did well. Vital signs were stable Smitty is doing well with exercise. Smitty has an occasional exertional BP noted during exercise. Will continue to monitor BP's Smitty continues to do well with exercise. Smitty will complete cardiac rehab on 05/27/20     Expected Outcomes Patient will continue to partcipate in phase 2 cardiac rehab for exercise, nutrtion and lifestyle modifications Patient will continue to partcipate in phase 2 cardiac rehab for exercise, nutrtion and lifestyle modifications Patient will continue to partcipate in phase 2 cardiac rehab for exercise, nutrtion and lifestyle modifications            Core Components/Risk Factors/Patient Goals at  Discharge (Final Review):   Goals and Risk Factor Review - 05/24/20 1357      Core Components/Risk Factors/Patient Goals Review   Personal Goals Review Weight Management/Obesity;Lipids;Hypertension    Review Smitty continues to do well with exercise. Smitty will complete cardiac rehab  on 05/27/20    Expected Outcomes Patient will continue to partcipate in phase 2 cardiac rehab for exercise, nutrtion and lifestyle modifications           ITP Comments:  ITP Comments    Row Name 03/31/20 0839 04/04/20 1411 05/03/20 1211 05/24/20 1356     ITP Comments Dr Fransico Him MD, Medical Director 30 Day ITP Review. Smitty started exercise on 04/04/20. Patient did well. Vital signs were stable. 30 Day ITP Review. Smitty has good attendance and participaiton in phase 2 cardiac rehab. Smitty is doing well with exercise. 30 Day ITP Review. Smitty has good attendance and participaiton in phase 2 cardiac rehab. Smitty is doing well with exercise. Smitty completes cardiac rehab on 05/27/20.           Comments: See ITP comments.Smitty completes cardiac rehab on 05/27/20. Barnet Pall, RN,BSN 05/24/2020 2:03 PM

## 2020-05-25 ENCOUNTER — Other Ambulatory Visit: Payer: Self-pay

## 2020-05-25 ENCOUNTER — Encounter (HOSPITAL_COMMUNITY)
Admission: RE | Admit: 2020-05-25 | Discharge: 2020-05-25 | Disposition: A | Discharge status: Home / Self Care | Payer: Medicare Other | Source: Ambulatory Visit | Attending: Interventional Cardiology | Admitting: Interventional Cardiology

## 2020-05-25 DIAGNOSIS — I214 Non-ST elevation (NSTEMI) myocardial infarction: Secondary | ICD-10-CM

## 2020-05-25 DIAGNOSIS — Z951 Presence of aortocoronary bypass graft: Secondary | ICD-10-CM

## 2020-05-25 DIAGNOSIS — Z952 Presence of prosthetic heart valve: Secondary | ICD-10-CM

## 2020-05-27 ENCOUNTER — Encounter (HOSPITAL_COMMUNITY)
Admission: RE | Admit: 2020-05-27 | Discharge: 2020-05-27 | Disposition: A | Discharge status: Home / Self Care | Payer: Medicare Other | Source: Ambulatory Visit | Attending: Interventional Cardiology | Admitting: Interventional Cardiology

## 2020-05-27 ENCOUNTER — Other Ambulatory Visit: Payer: Self-pay

## 2020-05-27 VITALS — BP 132/70 | HR 52 | Ht 61.25 in | Wt 158.1 lb

## 2020-05-27 DIAGNOSIS — Z951 Presence of aortocoronary bypass graft: Secondary | ICD-10-CM

## 2020-05-27 DIAGNOSIS — I214 Non-ST elevation (NSTEMI) myocardial infarction: Secondary | ICD-10-CM

## 2020-05-27 DIAGNOSIS — Z952 Presence of prosthetic heart valve: Secondary | ICD-10-CM

## 2020-05-27 NOTE — Progress Notes (Signed)
Discharge Progress Report  Patient Details  Name: Alan Mcdonald MRN: [id number] Date of Birth: [birth year] Referring Provider:   Flowsheet Row CARDIAC REHAB PHASE II EXERCISE from 04/04/2020 in [place]  Referring Provider Latricia Heft MD, [facility]/ Dr Fransico Him Covering       Number of Visits: 21  Reason for Discharge:  Patient reached a stable level of exercise. Patient independent in their exercise. Patient has met program and personal goals.  Smoking History:  Social History   Tobacco Use  Smoking Status Former Smoker  . Types: Cigarettes  . Quit date: 2003  . 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  ADL [facility]:   Initial Exercise Prescription:  Initial Exercise Prescription - 04/04/20 1300      Date of Initial Exercise RX and Referring Provider   Referring Provider Latricia Heft MD, [facility]/ Dr Fransico Him Covering           Discharge Exercise Prescription (Final Exercise Prescription Changes):  Exercise Prescription Changes - 05/27/20 1051      Response to Exercise   Blood Pressure (Admit) 132/70    Blood Pressure (Exercise) 152/80    Blood Pressure (Exit) 132/82    Heart Rate (Admit) 52 bpm    Heart Rate (Exercise) 95 bpm    Heart Rate (Exit) 59 bpm    Rating of Perceived Exertion (Exercise) 12    Perceived Dyspnea (Exercise) 0    Symptoms None    Duration Progress to 30 minutes of  aerobic without signs/symptoms of physical distress    Intensity THRR unchanged      Progression   Progression Continue to progress workloads to maintain intensity without signs/symptoms of physical distress.    Average METs 4      Resistance Training   Training Prescription Yes    Weight 5lbs    Reps 10-15    Time 10 Minutes      Interval Training    Interval Training No      Treadmill   MPH 2.8    Grade 3    Minutes 15    METs 4.3      NuStep   Level 5    SPM 85    Minutes 15    METs 3.8      Home Exercise Plan   Plans to continue exercise at Home (comment)   Treadmill, elliptical, total gym   Frequency Add 4 additional days to program exercise sessions.    Initial Home Exercises Provided 04/13/20           Functional Capacity:  6 Minute Walk    Row Name 03/31/20 1016 05/20/20 1048       6 Minute Walk   Phase Initial Discharge    Distance 1490 feet 1865 feet    Distance % Change -- 25.17 %    Distance Feet Change -- 375 ft    Walk Time 6 minutes 6 minutes    # of Rest Breaks 0 0    MPH 2.8 3.53    METS 2.9 3.98    RPE 12 13.92    Perceived Dyspnea  0 0    VO2 Peak 10.4 --    Symptoms No No    Resting HR 71 bpm 71 bpm  Resting BP 134/70 136/76    Resting Oxygen Saturation  97 % --    Exercise Oxygen Saturation  during 6 min walk 97 % --    Max Ex. HR 85 bpm 91 bpm    Max Ex. BP 140/80 182/80    2 Minute Post BP 130/68 --           Psychological, QOL, Others - Outcomes: PHQ 2/9: Depression screen [facility] 2/9 05/27/2020 03/31/2020  Decreased Interest 0 0  Down, Depressed, Hopeless 0 0  PHQ - 2 Score 0 0    Quality of Life:  Quality of Life - 05/20/20 1248      Quality of Life   Select Quality of Life      Quality of Life Scores   Health/Function Pre 26.7 %    Health/Function Post 29.2 %    Health/Function % Change 9.36 %    Socioeconomic Pre 26.81 %    Socioeconomic Post 28.63 %    Socioeconomic % Change  6.79 %    Psych/Spiritual Pre 30 %    Psych/Spiritual Post 30 %    Psych/Spiritual % Change 0 %    Family Pre 25.2 %    Family Post 30 %    Family % Change 19.05 %    GLOBAL Pre 27.17 %    GLOBAL Post 29.34 %    GLOBAL % Change 7.99 %           Personal Goals: Goals established at orientation with interventions provided to work toward goal.  Personal Goals and Risk Factors  at Admission - 03/31/20 1125      Core Components/Risk Factors/Patient Goals on Admission    Weight Management Yes    Intervention Weight Management: Develop a combined nutrition and exercise program designed to reach desired caloric intake, while maintaining appropriate intake of nutrient and fiber, sodium and fats, and appropriate energy expenditure required for the weight goal.;Weight Management: Provide education and appropriate resources to help participant work on and attain dietary goals.    Expected Outcomes Short Term: Continue to assess and modify interventions until short term weight is achieved;Long Term: Adherence to nutrition and physical activity/exercise program aimed toward attainment of established weight goal;Weight Maintenance: Understanding of the daily nutrition guidelines, which includes 25-35% calories from fat, 7% or less cal from saturated fats, less than 261m cholesterol, less than 1.5gm of sodium, & 5 or more servings of fruits and vegetables daily    Hypertension Yes    Intervention Provide education on lifestyle modifcations including regular physical activity/exercise, weight management, moderate sodium restriction and increased consumption of fresh fruit, vegetables, and low fat dairy, alcohol moderation, and smoking cessation.;Monitor prescription use compliance.    Expected Outcomes Short Term: Continued assessment and intervention until BP is < 140/930mHG in hypertensive participants. < 130/8026mG in hypertensive participants with diabetes, heart failure or chronic kidney disease.;Long Term: Maintenance of blood pressure at goal levels.    Lipids Yes    Intervention Provide education and support for participant on nutrition & aerobic/resistive exercise along with prescribed medications to achieve LDL <39m89mDL >40mg49m Expected Outcomes Short Term: Participant states understanding of desired cholesterol values and is compliant with medications prescribed.  Participant is following exercise prescription and nutrition guidelines.;Long Term: Cholesterol controlled with medications as prescribed, with individualized exercise RX and with personalized nutrition plan. Value goals: LDL < 39mg,81m > 40 mg.  Personal Goals Discharge:  Goals and Risk Factor Review    Row Name 04/04/20 1442 05/03/20 1212 05/24/20 1357         Core Components/Risk Factors/Patient Goals Review   Personal Goals Review Weight Management/Obesity;Lipids;Hypertension Weight Management/Obesity;Lipids;Hypertension Weight Management/Obesity;Lipids;Hypertension     Review Smitty started exercise on 04/04/20. Patient did well. Vital signs were stable Smitty is doing well with exercise. Smitty has an occasional exertional BP noted during exercise. Will continue to monitor BP's Smitty continues to do well with exercise. Smitty will complete cardiac rehab on 05/27/20     Expected Outcomes Patient will continue to partcipate in phase 2 cardiac rehab for exercise, nutrtion and lifestyle modifications Patient will continue to partcipate in phase 2 cardiac rehab for exercise, nutrtion and lifestyle modifications Patient will continue to partcipate in phase 2 cardiac rehab for exercise, nutrtion and lifestyle modifications            Exercise Goals and Review:  Exercise Goals    Row Name 03/31/20 1017             Exercise Goals   Increase Physical Activity Yes       Intervention Provide advice, education, support and counseling about physical activity/exercise needs.;Develop an individualized exercise prescription for aerobic and resistive training based on initial evaluation findings, risk stratification, comorbidities and participant's personal goals.       Expected Outcomes Short Term: Attend rehab on a regular basis to increase amount of physical activity.;Long Term: Add in home exercise to make exercise part of routine and to increase amount of physical activity.;Long  Term: Exercising regularly at least 3-5 days a week.       Increase Strength and Stamina Yes       Intervention Provide advice, education, support and counseling about physical activity/exercise needs.;Develop an individualized exercise prescription for aerobic and resistive training based on initial evaluation findings, risk stratification, comorbidities and participant's personal goals.       Expected Outcomes Short Term: Increase workloads from initial exercise prescription for resistance, speed, and METs.;Short Term: Perform resistance training exercises routinely during rehab and add in resistance training at home;Long Term: Improve cardiorespiratory fitness, muscular endurance and strength as measured by increased METs and functional capacity (6MWT)       Able to understand and use rate of perceived exertion (RPE) scale Yes       Intervention Provide education and explanation on how to use RPE scale       Expected Outcomes Short Term: Able to use RPE daily in rehab to express subjective intensity level;Long Term:  Able to use RPE to guide intensity level when exercising independently       Knowledge and understanding of Target Heart Rate Range (THRR) Yes       Intervention Provide education and explanation of THRR including how the numbers were predicted and where they are located for reference       Expected Outcomes Short Term: Able to state/look up THRR;Long Term: Able to use THRR to govern intensity when exercising independently;Short Term: Able to use daily as guideline for intensity in rehab       Able to check pulse independently Yes       Intervention Provide education and demonstration on how to check pulse in carotid and radial arteries.;Review the importance of being able to check your own pulse for safety during independent exercise       Expected Outcomes Short Term: Able to explain why pulse checking is important during  independent exercise;Long Term: Able to check pulse independently  and accurately       Understanding of Exercise Prescription Yes       Intervention Provide education, explanation, and written materials on patient's individual exercise prescription       Expected Outcomes Short Term: Able to explain program exercise prescription;Long Term: Able to explain home exercise prescription to exercise independently              Exercise Goals Re-Evaluation:  Exercise Goals Re-Evaluation    Row Name 04/04/20 1307 04/13/20 1051 04/27/20 1055 05/10/20 1148 05/18/20 1115     Exercise Goal Re-Evaluation   Exercise Goals Review Understanding of Exercise Prescription;Knowledge and understanding of Target Heart Rate Range (THRR);Able to understand and use rate of perceived exertion (RPE) scale Understanding of Exercise Prescription;Knowledge and understanding of Target Heart Rate Range (THRR);Able to understand and use rate of perceived exertion (RPE) scale;Increase Physical Activity;Increase Strength and Stamina;Able to check pulse independently Understanding of Exercise Prescription;Knowledge and understanding of Target Heart Rate Range (THRR);Able to understand and use rate of perceived exertion (RPE) scale;Increase Physical Activity;Increase Strength and Stamina;Able to check pulse independently Understanding of Exercise Prescription;Knowledge and understanding of Target Heart Rate Range (THRR);Able to understand and use rate of perceived exertion (RPE) scale;Increase Physical Activity;Increase Strength and Stamina;Able to check pulse independently Understanding of Exercise Prescription;Knowledge and understanding of Target Heart Rate Range (THRR);Able to understand and use rate of perceived exertion (RPE) scale;Increase Physical Activity;Increase Strength and Stamina;Able to check pulse independently   Comments Pt's first day of exercise. Pt able to exercise 30 minutes with no difficulty. Will continue to monitor and progress workloads as tolerated. Reviewed home exercise  guidelines with patient including endpoints, temperature precautions, target heart rate and rate of perceived exertion. Patient is exercising daily and doing well. Patient is currently walking on his treadmill, using his Elliptical machine or Total gym as his mode of home exercise. Patient walks about 1 mile in 25 minutes on his TM at 2.4 mph 1.5% incline. Patient has a pulse oximeter that he uses to monitor his heart rate. Patient has 1,2,5, and 10 lb weights at home. Patient noted that he has some left shoulder weakness when doing the overhead shoulder press. Patient has resumed doing sit-ups at home. I encouraged patient to make sure he discusses with his physician because of sternal precautions, and patient is agreeable to this. Patient will hold sit-ups until talking with his physician. Patient states that he is progressing very well between exercise at cardiac rehab and his home exercise routine. Patient is using 3 lb weights at CR and 5 lb weights at home for his resistance training. Patient is working out daily without any issues. Patient continues to progess well with exercise. Increased hand weights from 3 to 4 lbs. Patient continues to progress well with exercise. Patient states that he started back using his elliptical at home yeesterday and tolerated well. Patient exercised 15 minutes on his elliptical and 15 minutes on his treadmill.   Expected Outcomes Pt will continue to increase strength and stamina. Patient will continue daily exercise routine to help achieve personal health and fitness goals. Patient will continue daily exercise routine to help build strength and stamina. Increase workloads as tolerated to help improve cardiorespiratory fitness. Patient will continue home exericse routine in addition to exercise at cardiac rehab to help improve functional capacity.   Row Name 05/20/20 1100 05/27/20 1144           Exercise Goal  Re-Evaluation   Exercise Goals Review Understanding of Exercise  Prescription;Knowledge and understanding of Target Heart Rate Range (THRR);Able to understand and use rate of perceived exertion (RPE) scale;Increase Physical Activity;Increase Strength and Stamina;Able to check pulse independently Understanding of Exercise Prescription;Knowledge and understanding of Target Heart Rate Range (THRR);Able to understand and use rate of perceived exertion (RPE) scale;Increase Physical Activity;Increase Strength and Stamina;Able to check pulse independently      Comments Patient scheduled to complete the cardiac rehab next week. Functional capacity increased 25% as measured by 6-minute walk test, strength increased 5% as measured by grip strength test. Patient completed the cardiac rehab program and has progressed well, achieving 4.0 METs with exercise. Patient has a treadmill and elliptical at home as well as weights and plans to continue exercise daily.      Expected Outcomes Patient will continue home exericse routine to help maintain health and fitness gains. Patient will exercise 30 minutes, 6-7 days/week to maintain health and fitness gains.             Nutrition & Weight - Outcomes:  Pre Biometrics - 03/31/20 1018      Pre Biometrics   Height 5' 1.25" (1.556 m)    Weight 70.1 kg    Waist Circumference 39.5 inches    Hip Circumference 40 inches    Waist to Hip Ratio 0.99 %    BMI (Calculated) 28.95    Triceps Skinfold 11 mm    % Body Fat 27.5 %    Grip Strength 30 kg    Flexibility 11 in    Single Leg Stand 30 seconds           Post Biometrics - 05/27/20 1049       Post  Biometrics   Waist Circumference 38.5 inches    Hip Circumference 39 inches    Waist to Hip Ratio 0.99 %    Triceps Skinfold 8 mm    % Body Fat 26 %    Grip Strength 31.5 kg    Flexibility 10.25 in    Single Leg Stand 30 seconds           Nutrition:  Nutrition Therapy & Goals - 04/29/20 1117      Nutrition Therapy   Diet Heart Healthy      Personal Nutrition Goals    Nutrition Goal Pt to build a healthy plate including vegetables, fruits, whole grains, and low-fat dairy products in a heart healthy meal plan.      Intervention Plan   Intervention Prescribe, educate and counsel regarding individualized specific dietary modifications aiming towards targeted core components such as weight, hypertension, lipid management, diabetes, heart failure and other comorbidities.    Expected Outcomes Short Term Goal: Understand basic principles of dietary content, such as calories, fat, sodium, cholesterol and nutrients.           Nutrition Discharge:  Nutrition Assessments - 05/31/20 1439      MEDFICTS Scores   Post Score 22           Education Questionnaire Score:  Knowledge Questionnaire Score - 05/20/20 1249      Knowledge Questionnaire Score   Pre Score 21/24    Post Score 22/24           Goals reviewed with patient; copy given to patient. Pt graduated from cardiac rehab program today with completion of 21 exercise sessions in Phase II. Pt maintained good attendance and progressed nicely during his participation in rehab as evidenced by  increased MET level.   Medication list reconciled. Repeat  PHQ score-0  .  Pt has made significant lifestyle changes and should be commended for his success. Pt feels he has achieved his goals during cardiac rehab.   Pt plans to continue exercise by walking and using his elliptical at home. Smitty increased his distance on his post exercise walk test by 375 feet. We are proud of Smitty's progress!Barnet Pall, RN,BSN 06/01/2020 3:52 PM

## 2020-06-06 ENCOUNTER — Ambulatory Visit (INDEPENDENT_AMBULATORY_CARE_PROVIDER_SITE_OTHER): Payer: Medicare Other | Admitting: Podiatry

## 2020-06-06 ENCOUNTER — Other Ambulatory Visit: Payer: Self-pay

## 2020-06-06 ENCOUNTER — Encounter: Payer: Self-pay | Admitting: Podiatry

## 2020-06-06 DIAGNOSIS — I200 Unstable angina: Secondary | ICD-10-CM | POA: Diagnosis not present

## 2020-06-06 DIAGNOSIS — M109 Gout, unspecified: Secondary | ICD-10-CM | POA: Diagnosis not present

## 2020-06-06 DIAGNOSIS — M779 Enthesopathy, unspecified: Secondary | ICD-10-CM | POA: Diagnosis not present

## 2020-06-06 NOTE — Patient Instructions (Signed)
 Gout  Gout is painful swelling of your joints. Gout is a type of arthritis. It is caused by having too much uric acid in your body. Uric acid is a chemical that is made when your body breaks down substances called purines. If your body has too much uric acid, sharp crystals can form and build up in your joints. This causes pain and swelling. Gout attacks can happen quickly and be very painful (acute gout). Over time, the attacks can affect more joints and happen more often (chronic gout). What are the causes?  Too much uric acid in your blood. This can happen because: ? Your kidneys do not remove enough uric acid from your blood. ? Your body makes too much uric acid. ? You eat too many foods that are high in purines. These foods include organ meats, some seafood, and beer.  Trauma or stress. What increases the risk?  Having a family history of gout.  Being male and middle-aged.  Being male and having gone through menopause.  Being very overweight (obese).  Drinking alcohol, especially beer.  Not having enough water in the body (being dehydrated).  Losing weight too quickly.  Having an organ transplant.  Having lead poisoning.  Taking certain medicines.  Having kidney disease.  Having a skin condition called psoriasis. What are the signs or symptoms? An attack of acute gout usually happens in just one joint. The most common place is the big toe. Attacks often start at night. Other joints that may be affected include joints of the feet, ankle, knee, fingers, wrist, or elbow. Symptoms of an attack may include:  Very bad pain.  Warmth.  Swelling.  Stiffness.  Shiny, red, or purple skin.  Tenderness. The affected joint may be very painful to touch.  Chills and fever. Chronic gout may cause symptoms more often. More joints may be involved. You may also have white or yellow lumps (tophi) on your hands or feet or in other areas near your joints. How is this  treated?  Treatment for this condition has two phases: treating an acute attack and preventing future attacks.  Acute gout treatment may include: ? NSAIDs. ? Steroids. These are taken by mouth or injected into a joint. ? Colchicine. This medicine relieves pain and swelling. It can be given by mouth or through an IV tube.  Preventive treatment may include: ? Taking small doses of NSAIDs or colchicine daily. ? Using a medicine that reduces uric acid levels in your blood. ? Making changes to your diet. You may need to see a food expert (dietitian) about what to eat and drink to prevent gout. Follow these instructions at home: During a gout attack   If told, put ice on the painful area: ? Put ice in a plastic bag. ? Place a towel between your skin and the bag. ? Leave the ice on for 20 minutes, 2-3 times a day.  Raise (elevate) the painful joint above the level of your heart as often as you can.  Rest the joint as much as possible. If the joint is in your leg, you may be given crutches.  Follow instructions from your doctor about what you cannot eat or drink. Avoiding future gout attacks  Eat a low-purine diet. Avoid foods and drinks such as: ? Liver. ? Kidney. ? Anchovies. ? Asparagus. ? Herring. ? Mushrooms. ? Mussels. ? Beer.  Stay at a healthy weight. If you want to lose weight, talk with your doctor. Do not lose   weight too fast.  Start or continue an exercise plan as told by your doctor. Eating and drinking  Drink enough fluids to keep your pee (urine) pale yellow.  If you drink alcohol: ? Limit how much you use to:  0-1 drink a day for women.  0-2 drinks a day for men. ? Be aware of how much alcohol is in your drink. In the [place] one drink equals one 12 oz bottle of beer (355 mL), one 5 oz glass of wine (148 mL), or one 1 oz glass of hard liquor (44 mL). General instructions  Take over-the-counter and prescription medicines only as told by your doctor.  Do  not drive or use heavy machinery while taking prescription pain medicine.  Return to your normal activities as told by your doctor. Ask your doctor what activities are safe for you.  Keep all follow-up visits as told by your doctor. This is important. Contact a doctor if:  You have another gout attack.  You still have symptoms of a gout attack after 10 days of treatment.  You have problems (side effects) because of your medicines.  You have chills or a fever.  You have burning pain when you pee (urinate).  You have pain in your lower back or belly. Get help right away if:  You have very bad pain.  Your pain cannot be controlled.  You cannot pee. Summary  Gout is painful swelling of the joints.  The most common site of pain is the big toe, but it can affect other joints.  Medicines and avoiding some foods can help to prevent and treat gout attacks. This information is not intended to replace advice given to you by your health care provider. Make sure you discuss any questions you have with your health care provider. Document Revised: 12/18/2017 Document Reviewed: 12/18/2017 Elsevier Patient Education  2020 Elsevier Inc.  

## 2020-06-08 NOTE — Progress Notes (Addendum)
Subjective:   Patient ID: Alan Mcdonald, male   DOB: 69 y.o.   MRN: [id number]   HPI Patient presents with acute redness around the first MPJ of the right foot stating its been sore and fluid buildup and is just occurred over the last couple days.  Patient does not remember any specific injury and states that it is very hard for him to sleep or have anything touching his foot   ROS      Objective:  Physical Exam  Neurovascular status intact negative Denna Haggard' sign noted with patient's right first MPJ found to be inflamed with fluid buildup and swelling and very very sore     Assessment:  Probability for acute gout with inflammatory capsulitis that has formed     Plan:  H&P reviewed gout and gave him sheets discussing foods to be careful with along with education concerning condition.  Today I did sterile prep I injected around the first MPJ 3 mg Dexasone Kenalog 5 mg Xylocaine and we discussed medications for him to take by mouth if no improvement  Patient is to come back in if any issues were to occur he may need chronic medication

## 2020-06-09 ENCOUNTER — Telehealth: Payer: Self-pay | Admitting: Podiatry

## 2020-06-09 NOTE — Telephone Encounter (Signed)
I corrected the chart   ?

## 2020-06-09 NOTE — Telephone Encounter (Signed)
The patient's wife left a message stating that there are some incorrect notes in his mychart notes.  1. It says there was an x-ray done and there was no x-ray done 2. It says there was a prescription given but there was not one done.  She would like a call back.

## 2020-06-17 ENCOUNTER — Ambulatory Visit (INDEPENDENT_AMBULATORY_CARE_PROVIDER_SITE_OTHER): Payer: Medicare Other | Admitting: Podiatry

## 2020-06-17 ENCOUNTER — Encounter: Payer: Self-pay | Admitting: Podiatry

## 2020-06-17 ENCOUNTER — Other Ambulatory Visit: Payer: Self-pay

## 2020-06-17 DIAGNOSIS — M76821 Posterior tibial tendinitis, right leg: Secondary | ICD-10-CM | POA: Diagnosis not present

## 2020-06-17 NOTE — Progress Notes (Signed)
   HPI: 70 y.o. male presenting today with a new complaint regarding pain and tenderness to the medial aspect of the ankle and along the medial longitudinal arch of the foot.  Patient states that he was recently into the office where he saw Dr. Paulla Dolly, podiatry, for an acute flareup of gout to the right great toe.  Patient states that that is feeling very well.  Over the last 2 weeks he developed pain and sensitivity to the medial aspect of the left foot and ankle.  He presents for further treatment and evaluation  Past Medical History:  Diagnosis Date  . Aortic stenosis 02/22/2020  . Coronary artery disease   . High cholesterol   . History of lymphoma   . Hypertension   . S/P aortic valve replacement with bioprosthetic valve 02/22/2020   Edwards Inspiris Resilia stented bovine pericardial tissue valve, size 25 mm  . 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  Dermatology: Skin is warm, dry and supple bilateral lower extremities. Negative for open lesions or macerations.  Vascular: Palpable pedal pulses bilaterally. No edema or erythema noted. Capillary refill within normal limits.  Neurological: Epicritic and protective threshold grossly intact bilaterally.   Musculoskeletal Exam: Range of motion within normal limits to all pedal and ankle joints bilateral. Muscle strength 5/5 in all groups bilateral.  There is some pain and tenderness along the posterior tibial tendon as it inserts onto the navicular tuberosity and underneath the medial longitudinal arch of the foot  Radiographic Exam:  Normal osseous mineralization. Joint spaces preserved. No fracture/dislocation/boney destruction.    Assessment: 1.  Insertional posterior tibial tendinitis right   Plan of Care:  1. Patient evaluated. 2.  Injection of 0.5 cc Celestone Soluspan injected along the posterior tibial tendon sheath right lower  extremity 3.  Patient cannot take NSAIDs.  He is on anticoagulant 4.  Recommend OTC Tylenol as needed 5.  Return to clinic as needed      Edrick Kins, DPM [facility]  Dr. Edrick Kins, DPM    [address], [place] [address]                Office [phone]  Fax [phone]

## 2020-06-25 ENCOUNTER — Other Ambulatory Visit: Payer: Self-pay | Admitting: Cardiology

## 2020-06-25 MED ORDER — AMOXICILLIN 500 MG PO CAPS: 2000.0000 mg | ORAL_CAPSULE | Freq: Once | ORAL | 0 refills | Status: AC

## 2020-06-25 NOTE — Progress Notes (Signed)
Dental prophylaxis prescription sent to requested pharmacy

## 2020-06-27 MED ORDER — AMOXICILLIN 500 MG PO CAPS: ORAL_CAPSULE | ORAL | 0 refills | Status: DC

## 2020-07-29 ENCOUNTER — Encounter: Payer: Self-pay | Admitting: Cardiology

## 2020-07-29 ENCOUNTER — Other Ambulatory Visit: Payer: Self-pay

## 2020-07-29 ENCOUNTER — Ambulatory Visit (INDEPENDENT_AMBULATORY_CARE_PROVIDER_SITE_OTHER): Payer: Medicare Other | Admitting: Cardiology

## 2020-07-29 VITALS — BP 130/70 | HR 55 | Ht 61.25 in | Wt 161.0 lb

## 2020-07-29 DIAGNOSIS — I2510 Atherosclerotic heart disease of native coronary artery without angina pectoris: Secondary | ICD-10-CM | POA: Diagnosis not present

## 2020-07-29 DIAGNOSIS — Z951 Presence of aortocoronary bypass graft: Secondary | ICD-10-CM

## 2020-07-29 DIAGNOSIS — I214 Non-ST elevation (NSTEMI) myocardial infarction: Secondary | ICD-10-CM | POA: Diagnosis not present

## 2020-07-29 NOTE — Progress Notes (Signed)
Cardiology Office Note:    Date:  07/29/2020   ID:  Alan Mcdonald, DOB 09-Nov-1950, MRN [id number]  PCP:  Clinic, [facility]  Cardiologist:  Candee Furbish, MD  Advanced Practice Provider:  No care team member to display Electrophysiologist:  None       Referring MD: [facility]     History of Present Illness:    Alan Mcdonald is a 70 y.o. 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 He had critical left main coronary artery disease as well as moderate aortic stenosis.  Did very well postoperatively. EF 60 to 65%.  Exercise and well.  Reviewed Dr. Ricard Dillon note.  Excellent.    Past Medical History:  Diagnosis Date  . Aortic stenosis 02/22/2020  . Coronary artery disease   . High cholesterol   . History of lymphoma   . Hypertension   . S/P aortic valve replacement with bioprosthetic valve 02/22/2020   Edwards Inspiris Resilia stented bovine pericardial tissue valve, size 25 mm  . 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 AORTIC VALVE REPLACEMENT N/A 02/22/2020   Procedure: AORTIC VALVE REPLACEMENT (AVR) USING EDWARDS Resilia 25 MM AORTIC VALVE.;  Surgeon: Rexene Alberts, MD;  Location: [place];  Service: Open Heart Surgery;  Laterality: N/A;  . CARDIAC CATHETERIZATION    . CERVICAL SPINE SURGERY    . CHOLECYSTECTOMY    . CORONARY ARTERY BYPASS GRAFT N/A 02/22/2020   Procedure: CORONARY ARTERY BYPASS GRAFTING (CABG) USING LIMA to LAD; ENDSCOPICALLY HARVEST RIGHT GREATER SAPHENOUS VEIN: SVG to OM1;  Surgeon: Rexene Alberts, MD;  Location: [place];  Service: Open Heart Surgery;  Laterality: N/A;  . ELBOW SURGERY     . ENDOVEIN HARVEST OF GREATER SAPHENOUS VEIN Right 02/22/2020   Procedure: ENDOVEIN HARVEST OF GREATER SAPHENOUS VEIN;  Surgeon: Rexene Alberts, MD;  Location: [place];  Service: Open Heart Surgery;  Laterality: Right;  . KIDNEY STONE SURGERY    . LEFT HEART CATH AND CORONARY ANGIOGRAPHY N/A 02/22/2020   Procedure: LEFT HEART CATH AND CORONARY ANGIOGRAPHY;  Surgeon: Belva Crome, MD;  Location: [facility];  Service: Cardiovascular;  Laterality: N/A;  . TEE WITHOUT CARDIOVERSION N/A 02/22/2020   Procedure: TRANSESOPHAGEAL ECHOCARDIOGRAM (TEE);  Surgeon: Rexene Alberts, MD;  Location: [place];  Service: Open Heart Surgery;  Laterality: N/A;    Current Medications: Current Meds  Medication Sig  . acetaminophen (TYLENOL) 500 MG tablet Take 500 mg by mouth every 6 (six) hours as needed for mild pain or moderate pain.  Marland Kitchen amoxicillin (AMOXIL) 500 MG capsule Take 4 capsules (2,000 mg) 30 minutes prior to dental procedure.  Marland Kitchen aspirin EC 81 MG tablet Take 81 mg by mouth daily.   Marland Kitchen atorvastatin (LIPITOR) 80 MG tablet Take 1 tablet (80 mg total) by mouth every evening.  . B Complex Vitamins (VITAMIN B-COMPLEX) TABS Take 1 tablet by mouth daily.   . carvedilol (COREG) 3.125 MG tablet Take 1 tablet (3.125 mg total) by mouth 2 (two) times daily with a meal.  . cholecalciferol (VITAMIN D3) 25 MCG (1000 UNIT) tablet Take 1,000 Units by mouth daily.  . clopidogrel (  PLAVIX) 75 MG tablet Take 75 mg by mouth daily.  . Coenzyme Q10 (CO Q-10) 200 MG CAPS Take 1 tablet by mouth daily.  Marland Kitchen glucosamine-chondroitin 500-400 MG tablet Take 1 tablet by mouth daily.  Marland Kitchen losartan (COZAAR) 100 MG tablet Take 1 tablet (100 mg total) by mouth daily.  . Multiple Vitamin (MULTIVITAMIN WITH MINERALS) TABS tablet Take 1 tablet by mouth daily.  Marland Kitchen RA KRILL OIL 500 MG CAPS Take by mouth.  . Turmeric 500 MG CAPS Take 1 tablet by mouth daily.  . Zinc 30 MG CAPS Take 1 capsule by mouth daily.     Allergies:   Patient has no  known allergies.   Social History   Socioeconomic History  . Marital status: Married    Spouse name: Not on file  . Number of children: Not on file  . Years of education: 52  . Highest education level: Associate degree: occupational, Hotel manager, or vocational program  Occupational History  . Occupation: semi retired  Tobacco Use  . Smoking status: Former Smoker    Types: Cigarettes    Quit date: 2003    Years since quitting: 19.1  . Smokeless tobacco: Never Used  Vaping Use  . Vaping Use: Never used  Substance and Sexual Activity  . Alcohol use: Yes    Comment: occasional  . Drug use: Never  . Sexual activity: Not on file  Other Topics Concern  . Not on file  Social History Narrative  . Not on file   Social Determinants of Health   Financial Resource Strain: Not on file  Food Insecurity: Not on file  Transportation Needs: Not on file  Physical Activity: Not on file  Stress: Not on file  Social Connections: Not on file     Family History: The patient's family history is not on file.  ROS:   Please see the history of present illness.     All other systems reviewed and are negative.  EKGs/Labs/Other Studies Reviewed:     Recent Labs: 02/21/2020: B Natriuretic Peptide 147.0 02/23/2020: Magnesium 1.8 04/28/2020: ALT 26; BUN 13; Creatinine, Ser 0.79; Hemoglobin 13.4; Platelets 133; Potassium 4.1; Sodium 141  Recent Lipid Panel    Component Value Date/Time   CHOL 131 04/28/2020 0851   TRIG 152 (H) 04/28/2020 0851   HDL 36 (L) 04/28/2020 0851   CHOLHDL 3.6 04/28/2020 0851   CHOLHDL 4.1 02/22/2020 0208   VLDL 30 02/22/2020 0208   LDLCALC 69 04/28/2020 0851     Risk Assessment/Calculations:      Physical Exam:    VS:  BP 130/70 (BP Location: Left Arm, Patient Position: Sitting, Cuff Size: Normal)   Pulse (!) 55   Ht 5' 1.25" (1.556 m)   Wt 161 lb (73 kg)   SpO2 96%   BMI 30.17 kg/m     Wt Readings from Last 3 Encounters:  07/29/20 161 lb (73 kg)   05/27/20 158 lb 1.1 oz (71.7 kg)  05/23/20 159 lb (72.1 kg)     GEN:  Well nourished, well developed in no acute distress HEENT: Normal NECK: No JVD; No carotid bruits LYMPHATICS: No lymphadenopathy CARDIAC: RRR, 1/6 SM, no rubs, gallops RESPIRATORY:  Clear to auscultation without rales, wheezing or rhonchi  ABDOMEN: Soft, non-tender, non-distended MUSCULOSKELETAL:  No edema; No deformity  SKIN: Warm and dry NEUROLOGIC:  Alert and oriented x 3 PSYCHIATRIC:  Normal affect   ASSESSMENT:    1. NSTEMI (non-ST elevated myocardial infarction) ([place])   2. Coronary  artery disease involving native coronary artery of native heart without angina pectoris   3. Hx of CABG    PLAN:    In order of problems listed above:  Non-STEMI CABG Coronary artery disease Aortic valve replacement-25 mm bovine -Doing very well exercising frequently.  Continue.  No anginal symptoms no shortness of breath.  No medication issues.  Compliant.  We will continue with dual antiplatelet therapy for 1 year.  Continue with aspirin lifelong.  Hyperlipidemia -Excellent response to atorvastatin 80.  LDL 69, at goal.  Follicular lymphoma -In remission     Medication Adjustments/Labs and Tests Ordered: Current medicines are reviewed at length with the patient today.  Concerns regarding medicines are outlined above.  No orders of the defined types were placed in this encounter.  No orders of the defined types were placed in this encounter.   Patient Instructions  Medication Instructions:  The current medical regimen is effective;  continue present plan and medications.  *If you need a refill on your cardiac medications before your next appointment, please call your pharmacy*  Follow-Up: At [facility], you and your health needs are our priority.  As part of our continuing mission to provide you with exceptional heart care, we have created designated Provider Care Teams.  These Care Teams include your  primary Cardiologist (physician) and Advanced Practice Providers (APPs -  Physician Assistants and Nurse Practitioners) who all work together to provide you with the care you need, when you need it.  We recommend signing up for the patient portal called "MyChart".  Sign up information is provided on this After Visit Summary.  MyChart is used to connect with patients for Virtual Visits (Telemedicine).  Patients are able to view lab/test results, encounter notes, upcoming appointments, etc.  Non-urgent messages can be sent to your provider as well.   To learn more about what you can do with MyChart, go to [email or web address].    Your next appointment:   6 month(s)  The format for your next appointment:   In Person  Provider:   Candee Furbish, MD  Thank you for choosing [facility]!!        Signed, Candee Furbish, MD  07/29/2020 10:19 AM    [place]

## 2020-07-29 NOTE — Patient Instructions (Signed)

## 2020-08-26 ENCOUNTER — Other Ambulatory Visit: Payer: Self-pay

## 2020-08-26 ENCOUNTER — Ambulatory Visit (INDEPENDENT_AMBULATORY_CARE_PROVIDER_SITE_OTHER): Payer: Medicare Other | Admitting: Podiatry

## 2020-08-26 ENCOUNTER — Encounter: Payer: Self-pay | Admitting: Podiatry

## 2020-08-26 DIAGNOSIS — M109 Gout, unspecified: Secondary | ICD-10-CM

## 2020-08-26 DIAGNOSIS — M7751 Other enthesopathy of right foot: Secondary | ICD-10-CM

## 2020-08-26 MED ORDER — BETAMETHASONE SOD PHOS & ACET 6 (3-3) MG/ML IJ SUSP
3.0000 mg | Freq: Once | INTRAMUSCULAR | Status: AC
  Administered 2020-08-26: 3 mg via INTRA_ARTICULAR

## 2020-08-26 NOTE — Progress Notes (Signed)
   HPI: 70 y.o. male presenting today for a new complaint regarding recurrence of gout right great toe.  Patient has a history of acute gout.  Injections that he is received in the past have helped significantly.  He presents for further treatment and evaluation  Past Medical History:  Diagnosis Date  . Aortic stenosis 02/22/2020  . Coronary artery disease   . High cholesterol   . History of lymphoma   . Hypertension   . S/P aortic valve replacement with bioprosthetic valve 02/22/2020   Edwards Inspiris Resilia stented bovine pericardial tissue valve, size 25 mm  . 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  Dermatology: Skin is warm, dry and supple bilateral lower extremities. Negative for open lesions or macerations.  Vascular: Palpable pedal pulses bilaterally.  There is some erythema and edema noted first MTPJ right. Capillary refill within normal limits.  Neurological: Epicritic and protective threshold grossly intact bilaterally.   Musculoskeletal Exam: Erythema with edema and pain on palpation range of motion of the first MTPJ of the right foot consistent with findings of acute gout Assessment: 1. Acute recurrence of gout RT great toe / 1st MTPJ capsulitis RT    Plan of Care:  1. Patient evaluated.  2. Injection of 0.5 cc Celestone Soluspan injection of the first MTPJ right foot 3.  Patient cannot take NSAIDs.  He is on anticoagulant medication since undergoing open heart surgery 4.  Continue Tylenol as needed 5.  I did recommend that he discuss possible allopurinol with his PCP.  Patient has had recurrent acute gout attacks.  Last attack was less than 6 months ago. 6.  Return to clinic as needed      Edrick Kins, DPM [facility]  Dr. Edrick Kins, DPM    [address], [place] [address]                Office  [phone]  Fax [phone]

## 2020-10-11 ENCOUNTER — Encounter: Payer: Self-pay | Admitting: *Deleted

## 2020-12-13 ENCOUNTER — Encounter (HOSPITAL_BASED_OUTPATIENT_CLINIC_OR_DEPARTMENT_OTHER): Payer: Self-pay

## 2021-01-12 ENCOUNTER — Other Ambulatory Visit: Payer: Self-pay

## 2021-01-12 ENCOUNTER — Encounter (HOSPITAL_BASED_OUTPATIENT_CLINIC_OR_DEPARTMENT_OTHER): Payer: Self-pay | Admitting: Cardiology

## 2021-01-12 ENCOUNTER — Ambulatory Visit (INDEPENDENT_AMBULATORY_CARE_PROVIDER_SITE_OTHER): Payer: Medicare Other | Admitting: Cardiology

## 2021-01-12 VITALS — BP 130/70 | HR 53 | Ht 61.25 in | Wt 160.0 lb

## 2021-01-12 DIAGNOSIS — Z951 Presence of aortocoronary bypass graft: Secondary | ICD-10-CM

## 2021-01-12 DIAGNOSIS — Z952 Presence of prosthetic heart valve: Secondary | ICD-10-CM | POA: Diagnosis not present

## 2021-01-12 DIAGNOSIS — I2510 Atherosclerotic heart disease of native coronary artery without angina pectoris: Secondary | ICD-10-CM

## 2021-01-12 NOTE — Patient Instructions (Signed)
Medication Instructions:  Please discontinue your Plavix after September 13.  Continue ASA.  Continue all other medications as listed.  *If you need a refill on your cardiac medications before your next appointment, please call your pharmacy*  Follow-Up: At [facility], you and your health needs are our priority.  As part of our continuing mission to provide you with exceptional heart care, we have created designated Provider Care Teams.  These Care Teams include your primary Cardiologist (physician) and Advanced Practice Providers (APPs -  Physician Assistants and Nurse Practitioners) who all work together to provide you with the care you need, when you need it.  We recommend signing up for the patient portal called "MyChart".  Sign up information is provided on this After Visit Summary.  MyChart is used to connect with patients for Virtual Visits (Telemedicine).  Patients are able to view lab/test results, encounter notes, upcoming appointments, etc.  Non-urgent messages can be sent to your provider as well.   To learn more about what you can do with MyChart, go to [email or web address].    Your next appointment:   6 month(s)  The format for your next appointment:   In Person  Provider:   Candee Furbish, MD Candee Furbish, MD  Thank you for choosing [facility]!!

## 2021-01-12 NOTE — Progress Notes (Signed)
Cardiology Office Note:    Date:  01/12/2021   ID:  Alan Mcdonald, DOB 05-28-1951, MRN [id number]  PCP:  [facility]   [facility] Providers Cardiologist:  Candee Furbish, MD     Referring MD: [facility]     History of Present Illness:    Alan Mcdonald is a 70 y.o. male here for follow-up coronary artery disease, aortic stenosis, hypertension, hyperlipidemia, prior amaurosis fugax on dual antiplatelet therapy with follicular lymphoma in remission.  Prior MI requiring urgent CABG x2, aortic valve replacement using bioprosthetic tissue valve on 02/22/2020.  He had critical left main coronary artery disease as well as moderate aortic stenosis at that time. - Doing well postoperatively, normal ejection fraction 65%.  Cardiothoracic surgery notes reviewed.  Overall been doing very well.  Going down to [place]  for a large wedding.  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   LIMA to LAD SVG to OM    Past Surgical History:  Procedure Laterality Date   AORTIC VALVE REPLACEMENT N/A 02/22/2020   Procedure: AORTIC VALVE REPLACEMENT (AVR) USING EDWARDS Resilia 25 MM AORTIC VALVE.;  Surgeon: Rexene Alberts, MD;  Location: [place];  Service: Open Heart Surgery;  Laterality: N/A;   CARDIAC CATHETERIZATION     CERVICAL SPINE SURGERY     CHOLECYSTECTOMY     CORONARY ARTERY BYPASS GRAFT N/A 02/22/2020   Procedure: CORONARY ARTERY BYPASS GRAFTING (CABG) USING LIMA to LAD; ENDSCOPICALLY HARVEST RIGHT GREATER SAPHENOUS VEIN: SVG to OM1;  Surgeon: Rexene Alberts, MD;  Location: [place];  Service: Open Heart Surgery;  Laterality: N/A;   ELBOW SURGERY     ENDOVEIN HARVEST OF GREATER SAPHENOUS VEIN  Right 02/22/2020   Procedure: ENDOVEIN HARVEST OF GREATER SAPHENOUS VEIN;  Surgeon: Rexene Alberts, MD;  Location: [place];  Service: Open Heart Surgery;  Laterality: Right;   KIDNEY STONE SURGERY     LEFT HEART CATH AND CORONARY ANGIOGRAPHY N/A 02/22/2020   Procedure: LEFT HEART CATH AND CORONARY ANGIOGRAPHY;  Surgeon: Belva Crome, MD;  Location: [place] [facility];  Service: Cardiovascular;  Laterality: N/A;   TEE WITHOUT CARDIOVERSION N/A 02/22/2020   Procedure: TRANSESOPHAGEAL ECHOCARDIOGRAM (TEE);  Surgeon: Rexene Alberts, MD;  Location: [place];  Service: Open Heart Surgery;  Laterality: N/A;    Current Medications: Current Meds  Medication Sig   acetaminophen (TYLENOL) 500 MG tablet Take 500 mg by mouth every 6 (six) hours as needed for mild pain or moderate pain.   amoxicillin (AMOXIL) 500 MG capsule Take 4 capsules (2,000 mg) 30 minutes prior to dental procedure.   aspirin EC 81 MG tablet Take 81 mg by mouth daily.    atorvastatin (LIPITOR) 80 MG tablet Take 1 tablet (80 mg total) by mouth every evening.   B Complex Vitamins (VITAMIN B-COMPLEX) TABS Take 1 tablet by mouth daily.    carvedilol (COREG) 3.125 MG tablet Take 1 tablet (3.125 mg total) by mouth 2 (two) times daily with a meal.   cholecalciferol (VITAMIN D3) 25 MCG (1000 UNIT) tablet Take 1,000 Units by mouth daily.   clopidogrel (PLAVIX) 75 MG tablet Take 75 mg by mouth  daily.   Coenzyme Q10 (CO Q-10) 200 MG CAPS Take 1 tablet by mouth daily.   glucosamine-chondroitin 500-400 MG tablet Take 1 tablet by mouth daily.   losartan (COZAAR) 100 MG tablet Take 1 tablet (100 mg total) by mouth daily.   Multiple Vitamin (MULTIVITAMIN WITH MINERALS) TABS tablet Take 1 tablet by mouth daily.   RA KRILL OIL 500 MG CAPS Take by mouth.   Turmeric 500 MG CAPS Take 1 tablet by mouth daily.   Zinc 30 MG CAPS Take 1 capsule by mouth daily.     Allergies:   Patient has no known allergies.   Social History   Socioeconomic History    Marital status: Married    Spouse name: Not on file   Number of children: Not on file   Years of education: 14   Highest education level: Associate degree: occupational, Hotel manager, or vocational program  Occupational History   Occupation: semi retired  Tobacco Use   Smoking status: Former    Types: Cigarettes    Quit date: 2003    Years since quitting: 19.6   Smokeless tobacco: Never  Vaping Use   Vaping Use: Never used  Substance and Sexual Activity   Alcohol use: Yes    Comment: occasional   Drug use: Never   Sexual activity: Not on file  Other Topics Concern   Not on file  Social History Narrative   Not on file   Social Determinants of Health   Financial Resource Strain: Not on file  Food Insecurity: Not on file  Transportation Needs: Not on file  Physical Activity: Not on file  Stress: Not on file  Social Connections: Not on file    EKGs/Labs/Other Studies Reviewed:    The following studies were reviewed today: ECHO 04/12/20:   1. Left ventricular ejection fraction, by estimation, is 60 to 65%. The  left ventricle has normal function. The left ventricle has no regional  wall motion abnormalities. Left ventricular diastolic parameters are  consistent with Grade I diastolic  dysfunction (impaired relaxation). Elevated left ventricular end-diastolic  pressure.   2. Right ventricular systolic function is normal. The right ventricular  size is normal. There is normal pulmonary artery systolic pressure.   3. The mitral valve is normal in structure. Trivial mitral valve  regurgitation. No evidence of mitral stenosis.   4. The aortic valve has been repaired/replaced. Aortic valve  regurgitation is not visualized. No aortic stenosis is present. Echo  findings are consistent with normal structure and function of the aortic  valve prosthesis. Aortic valve mean gradient  measures 5.6 mmHg. Aortic valve Vmax measures 1.52 m/s.   5. The inferior vena cava is normal in size  with greater than 50%  respiratory variability, suggesting right atrial pressure of 3 mmHg.   EKG:  EKG is  ordered today.  The ekg ordered today demonstrates sinus bradycardia 53 with right bundle branch block  Recent Labs: 02/21/2020: B Natriuretic Peptide 147.0 02/23/2020: Magnesium 1.8 04/28/2020: ALT 26; BUN 13; Creatinine, Ser 0.79; Hemoglobin 13.4; Platelets 133; Potassium 4.1; Sodium 141  Recent Lipid Panel    Component Value Date/Time   CHOL 131 04/28/2020 0851   TRIG 152 (H) 04/28/2020 0851   HDL 36 (L) 04/28/2020 0851   CHOLHDL 3.6 04/28/2020 0851   CHOLHDL 4.1 02/22/2020 0208   VLDL 30 02/22/2020 0208   LDLCALC 69 04/28/2020 0851     Risk Assessment/Calculations:          Physical Exam:  VS:  BP 130/70 (BP Location: Left Arm, Patient Position: Sitting, Cuff Size: Normal)   Pulse (!) 53   Ht 5' 1.25" (1.556 m)   Wt 160 lb (72.6 kg)   SpO2 96%   BMI 29.99 kg/m     Wt Readings from Last 3 Encounters:  01/12/21 160 lb (72.6 kg)  07/29/20 161 lb (73 kg)  05/27/20 158 lb 1.1 oz (71.7 kg)     GEN:  Well nourished, well developed in no acute distress HEENT: Normal NECK: No JVD; No carotid bruits LYMPHATICS: No lymphadenopathy CARDIAC: RRR, no murmurs, rubs, gallops CABG RESPIRATORY:  Clear to auscultation without rales, wheezing or rhonchi  ABDOMEN: Soft, non-tender, non-distended MUSCULOSKELETAL:  No edema; No deformity  SKIN: Warm and dry NEUROLOGIC:  Alert and oriented x 3 PSYCHIATRIC:  Normal affect   ASSESSMENT:    1. Coronary artery disease involving native coronary artery of native heart without angina pectoris   2. Hx of CABG   3. H/O aortic valve replacement    PLAN:    In order of problems listed above:  Coronary artery disease status post CABG - In the setting of non-ST elevation myocardial infarction.  Continue with goal-directed medical therapy which includes aspirin 81 mg, carvedilol 3.125 mg twice a day, low-dose secondary to  bradycardia.  He is also on losartan 100 mg a day.  No changes made. -He can stop his Plavix on September 13, 1 year anniversary of myocardial infarction/CABG.  Continue with aspirin lifelong.   Bioprosthetic aortic valve replacement - Previously moderate aortic stenosis in the setting of CABG. aspirin lifelong.  Dental prophylaxis.  Hyperlipidemia - Continue with atorvastatin 80 mg once a day high intensity dose.  No myalgias.  LDL 69 in November 2021.  Creatinine 0.79 potassium 4.1 ALT 26  Follicular lymphoma - Currently in remission.  Excellent.      Medication Adjustments/Labs and Tests Ordered: Current medicines are reviewed at length with the patient today.  Concerns regarding medicines are outlined above.  Orders Placed This Encounter  Procedures   EKG 12-Lead    No orders of the defined types were placed in this encounter.   Patient Instructions  Medication Instructions:  Please discontinue your Plavix after September 13.  Continue ASA.  Continue all other medications as listed.  *If you need a refill on your cardiac medications before your next appointment, please call your pharmacy*  Follow-Up: At [facility], you and your health needs are our priority.  As part of our continuing mission to provide you with exceptional heart care, we have created designated Provider Care Teams.  These Care Teams include your primary Cardiologist (physician) and Advanced Practice Providers (APPs -  Physician Assistants and Nurse Practitioners) who all work together to provide you with the care you need, when you need it.  We recommend signing up for the patient portal called "MyChart".  Sign up information is provided on this After Visit Summary.  MyChart is used to connect with patients for Virtual Visits (Telemedicine).  Patients are able to view lab/test results, encounter notes, upcoming appointments, etc.  Non-urgent messages can be sent to your provider as well.   To learn more  about what you can do with MyChart, go to [email or web address].    Your next appointment:   6 month(s)  The format for your next appointment:   In Person  Provider:   Candee Furbish, MD Candee Furbish, MD  Thank you for choosing [facility]!!  Signed, Candee Furbish, MD  01/12/2021 10:15 AM    [facility]

## 2021-02-06 ENCOUNTER — Other Ambulatory Visit: Payer: Self-pay

## 2021-02-06 ENCOUNTER — Ambulatory Visit (INDEPENDENT_AMBULATORY_CARE_PROVIDER_SITE_OTHER): Payer: Medicare Other | Admitting: Podiatry

## 2021-02-06 DIAGNOSIS — R0982 Postnasal drip: Secondary | ICD-10-CM | POA: Insufficient documentation

## 2021-02-06 DIAGNOSIS — Z461 Encounter for fitting and adjustment of hearing aid: Secondary | ICD-10-CM | POA: Insufficient documentation

## 2021-02-06 DIAGNOSIS — H9313 Tinnitus, bilateral: Secondary | ICD-10-CM | POA: Insufficient documentation

## 2021-02-06 DIAGNOSIS — D333 Benign neoplasm of cranial nerves: Secondary | ICD-10-CM | POA: Insufficient documentation

## 2021-02-06 DIAGNOSIS — D801 Nonfamilial hypogammaglobulinemia: Secondary | ICD-10-CM | POA: Insufficient documentation

## 2021-02-06 DIAGNOSIS — G4700 Insomnia, unspecified: Secondary | ICD-10-CM | POA: Insufficient documentation

## 2021-02-06 DIAGNOSIS — H2513 Age-related nuclear cataract, bilateral: Secondary | ICD-10-CM | POA: Insufficient documentation

## 2021-02-06 DIAGNOSIS — R251 Tremor, unspecified: Secondary | ICD-10-CM | POA: Insufficient documentation

## 2021-02-06 DIAGNOSIS — R0789 Other chest pain: Secondary | ICD-10-CM | POA: Insufficient documentation

## 2021-02-06 DIAGNOSIS — R059 Cough, unspecified: Secondary | ICD-10-CM | POA: Insufficient documentation

## 2021-02-06 DIAGNOSIS — M76821 Posterior tibial tendinitis, right leg: Secondary | ICD-10-CM | POA: Insufficient documentation

## 2021-02-06 DIAGNOSIS — Z87891 Personal history of nicotine dependence: Secondary | ICD-10-CM | POA: Insufficient documentation

## 2021-02-06 DIAGNOSIS — M76822 Posterior tibial tendinitis, left leg: Secondary | ICD-10-CM | POA: Diagnosis not present

## 2021-02-06 DIAGNOSIS — Z09 Encounter for follow-up examination after completed treatment for conditions other than malignant neoplasm: Secondary | ICD-10-CM | POA: Insufficient documentation

## 2021-02-06 DIAGNOSIS — K639 Disease of intestine, unspecified: Secondary | ICD-10-CM | POA: Insufficient documentation

## 2021-02-06 DIAGNOSIS — G20 Parkinson's disease: Secondary | ICD-10-CM | POA: Insufficient documentation

## 2021-02-06 DIAGNOSIS — M25561 Pain in right knee: Secondary | ICD-10-CM | POA: Insufficient documentation

## 2021-02-06 DIAGNOSIS — Z9049 Acquired absence of other specified parts of digestive tract: Secondary | ICD-10-CM | POA: Insufficient documentation

## 2021-02-17 ENCOUNTER — Other Ambulatory Visit (HOSPITAL_COMMUNITY): Payer: Medicare Other

## 2021-02-17 ENCOUNTER — Observation Stay (HOSPITAL_COMMUNITY)
Admission: EM | Admit: 2021-02-17 | Discharge: 2021-02-18 | Disposition: A | Discharge status: Home / Self Care | Payer: Medicare Other | Attending: Internal Medicine | Admitting: Internal Medicine

## 2021-02-17 ENCOUNTER — Encounter (HOSPITAL_COMMUNITY): Payer: Self-pay | Admitting: Internal Medicine

## 2021-02-17 ENCOUNTER — Emergency Department (HOSPITAL_COMMUNITY): Payer: Medicare Other

## 2021-02-17 ENCOUNTER — Observation Stay (HOSPITAL_COMMUNITY): Payer: Medicare Other

## 2021-02-17 ENCOUNTER — Telehealth: Payer: Self-pay | Admitting: Cardiology

## 2021-02-17 ENCOUNTER — Other Ambulatory Visit: Payer: Self-pay

## 2021-02-17 DIAGNOSIS — I2510 Atherosclerotic heart disease of native coronary artery without angina pectoris: Secondary | ICD-10-CM | POA: Diagnosis not present

## 2021-02-17 DIAGNOSIS — Z951 Presence of aortocoronary bypass graft: Secondary | ICD-10-CM | POA: Insufficient documentation

## 2021-02-17 DIAGNOSIS — I639 Cerebral infarction, unspecified: Principal | ICD-10-CM | POA: Insufficient documentation

## 2021-02-17 DIAGNOSIS — G20 Parkinson's disease: Secondary | ICD-10-CM | POA: Insufficient documentation

## 2021-02-17 DIAGNOSIS — Z87891 Personal history of nicotine dependence: Secondary | ICD-10-CM | POA: Insufficient documentation

## 2021-02-17 DIAGNOSIS — I10 Essential (primary) hypertension: Secondary | ICD-10-CM | POA: Insufficient documentation

## 2021-02-17 DIAGNOSIS — Z7982 Long term (current) use of aspirin: Secondary | ICD-10-CM | POA: Insufficient documentation

## 2021-02-17 DIAGNOSIS — Z79899 Other long term (current) drug therapy: Secondary | ICD-10-CM | POA: Diagnosis not present

## 2021-02-17 DIAGNOSIS — R262 Difficulty in walking, not elsewhere classified: Secondary | ICD-10-CM | POA: Diagnosis present

## 2021-02-17 DIAGNOSIS — R2990 Unspecified symptoms and signs involving the nervous system: Secondary | ICD-10-CM | POA: Diagnosis present

## 2021-02-17 DIAGNOSIS — Z8669 Personal history of other diseases of the nervous system and sense organs: Secondary | ICD-10-CM | POA: Insufficient documentation

## 2021-02-17 DIAGNOSIS — Z20822 Contact with and (suspected) exposure to covid-19: Secondary | ICD-10-CM | POA: Diagnosis not present

## 2021-02-17 DIAGNOSIS — I6330 Cerebral infarction due to thrombosis of unspecified cerebral artery: Secondary | ICD-10-CM | POA: Insufficient documentation

## 2021-02-17 DIAGNOSIS — Y900 Blood alcohol level of less than 20 mg/100 ml: Secondary | ICD-10-CM | POA: Diagnosis not present

## 2021-02-17 DIAGNOSIS — E785 Hyperlipidemia, unspecified: Secondary | ICD-10-CM | POA: Diagnosis present

## 2021-02-17 DIAGNOSIS — G459 Transient cerebral ischemic attack, unspecified: Secondary | ICD-10-CM | POA: Diagnosis present

## 2021-02-17 LAB — RAPID URINE DRUG SCREEN, HOSP PERFORMED
Amphetamines: NOT DETECTED
Barbiturates: NOT DETECTED
Benzodiazepines: NOT DETECTED
Cocaine: NOT DETECTED
Opiates: NOT DETECTED
Tetrahydrocannabinol: POSITIVE — AB

## 2021-02-17 LAB — COMPREHENSIVE METABOLIC PANEL
ALT: 18 U/L (ref 0–44)
AST: 22 U/L (ref 15–41)
Albumin: 3.8 g/dL (ref 3.5–5.0)
Alkaline Phosphatase: 78 U/L (ref 38–126)
Anion gap: 10 (ref 5–15)
BUN: 12 mg/dL (ref 8–23)
CO2: 23 mmol/L (ref 22–32)
Calcium: 9 mg/dL (ref 8.9–10.3)
Chloride: 107 mmol/L (ref 98–111)
Creatinine, Ser: 0.72 mg/dL (ref 0.61–1.24)
GFR, Estimated: >60 mL/min (ref >60)
Glucose, Bld: 138 mg/dL — ABNORMAL HIGH (ref 70–99)
Potassium: 3.5 mmol/L (ref 3.5–5.1)
Sodium: 140 mmol/L (ref 135–145)
Total Bilirubin: 1.1 mg/dL (ref 0.3–1.2)
Total Protein: 5.8 g/dL — ABNORMAL LOW (ref 6.5–8.1)

## 2021-02-17 LAB — URINALYSIS, ROUTINE W REFLEX MICROSCOPIC
Bilirubin Urine: NEGATIVE
Glucose, UA: NEGATIVE mg/dL
Hgb urine dipstick: NEGATIVE
Ketones, ur: NEGATIVE mg/dL
Leukocytes,Ua: NEGATIVE
Nitrite: NEGATIVE
Protein, ur: NEGATIVE mg/dL
Specific Gravity, Urine: <1.005 — ABNORMAL LOW (ref 1.005–1.030)
pH: 6 (ref 5.0–8.0)

## 2021-02-17 LAB — CBC
HCT: 39.6 % (ref 39.0–52.0)
Hemoglobin: 13.7 g/dL (ref 13.0–17.0)
MCH: 32.2 pg (ref 26.0–34.0)
MCHC: 34.6 g/dL (ref 30.0–36.0)
MCV: 93 fL (ref 80.0–100.0)
Platelets: 98 10*3/uL — ABNORMAL LOW (ref 150–400)
RBC: 4.26 MIL/uL (ref 4.22–5.81)
RDW: 13.1 % (ref 11.5–15.5)
WBC: 6.3 10*3/uL (ref 4.0–10.5)
nRBC: 0 % (ref 0.0–0.2)

## 2021-02-17 LAB — I-STAT CHEM 8, ED
BUN: 10 mg/dL (ref 8–23)
Calcium, Ion: 1.19 mmol/L (ref 1.15–1.40)
Chloride: 107 mmol/L (ref 98–111)
Creatinine, Ser: 0.7 mg/dL (ref 0.61–1.24)
Glucose, Bld: 139 mg/dL — ABNORMAL HIGH (ref 70–99)
HCT: 37 % — ABNORMAL LOW (ref 39.0–52.0)
Hemoglobin: 12.6 g/dL — ABNORMAL LOW (ref 13.0–17.0)
Potassium: 3.6 mmol/L (ref 3.5–5.1)
Sodium: 142 mmol/L (ref 135–145)
TCO2: 26 mmol/L (ref 22–32)

## 2021-02-17 LAB — DIFFERENTIAL
Abs Immature Granulocytes: 0.03 10*3/uL (ref 0.00–0.07)
Basophils Absolute: 0 10*3/uL (ref 0.0–0.1)
Basophils Relative: 0 %
Eosinophils Absolute: 0.3 10*3/uL (ref 0.0–0.5)
Eosinophils Relative: 4 %
Immature Granulocytes: 1 %
Lymphocytes Relative: 8 %
Lymphs Abs: 0.5 10*3/uL — ABNORMAL LOW (ref 0.7–4.0)
Monocytes Absolute: 0.6 10*3/uL (ref 0.1–1.0)
Monocytes Relative: 9 %
Neutro Abs: 5 10*3/uL (ref 1.7–7.7)
Neutrophils Relative %: 78 %

## 2021-02-17 LAB — RESP PANEL BY RT-PCR (FLU A&B, COVID) ARPGX2
Influenza A by PCR: NEGATIVE
Influenza B by PCR: NEGATIVE
SARS Coronavirus 2 by RT PCR: NEGATIVE

## 2021-02-17 LAB — PROTIME-INR
INR: 1.1 (ref 0.8–1.2)
Prothrombin Time: 14.3 seconds (ref 11.4–15.2)

## 2021-02-17 LAB — ETHANOL: Alcohol, Ethyl (B): <10 mg/dL (ref <10)

## 2021-02-17 LAB — CBG MONITORING, ED: Glucose-Capillary: 135 mg/dL — ABNORMAL HIGH (ref 70–99)

## 2021-02-17 LAB — APTT: aPTT: 32 seconds (ref 24–36)

## 2021-02-17 IMAGING — MR MR MRA HEAD W/O CM
2 series · 17 of 48 positions shown · non-contrast
Comparison: CT from earlier the same day.

CLINICAL DATA: Follow-up examination for acute stroke.

EXAM:
MRI HEAD WITHOUT CONTRAST
MRA HEAD WITHOUT CONTRAST
TECHNIQUE: Multiplanar, multi-echo pulse sequences of the brain and surrounding
structures were acquired without intravenous contrast. Angiographic
images of the Circle of Willis were acquired using MRA technique
without intravenous contrast.

[Series 2: ax (id) · axial · 1.0mm · 0.43mm/px · z∈[-21,+63]mm · 16 of 176 slices shown]
[im 1/176]
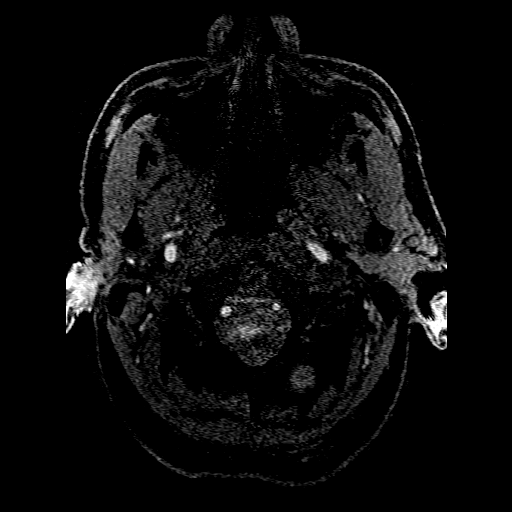
[im 4/176]
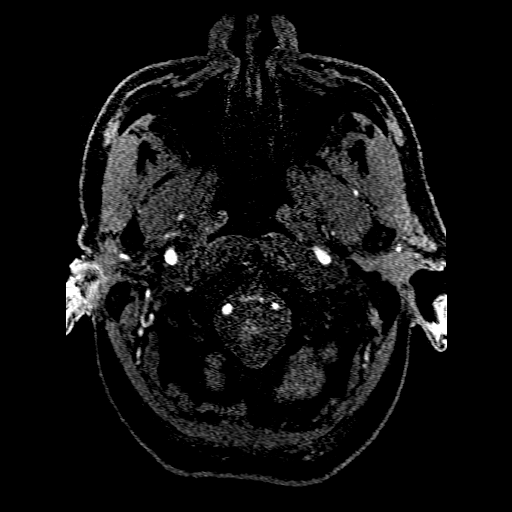
[im 8/176]
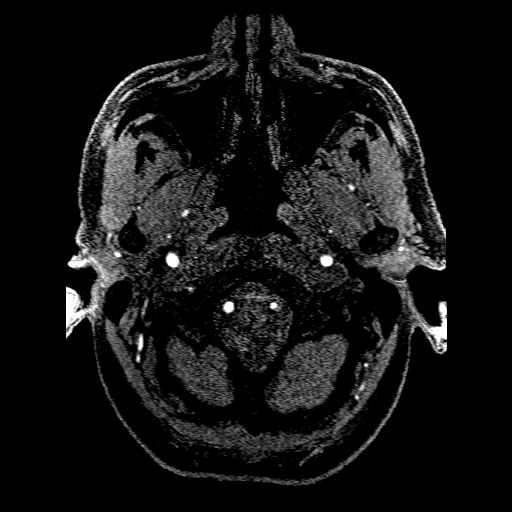
[im 12/176]
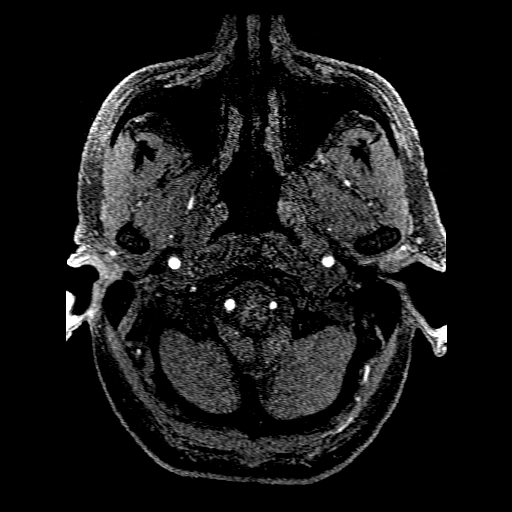
[im 16/176]
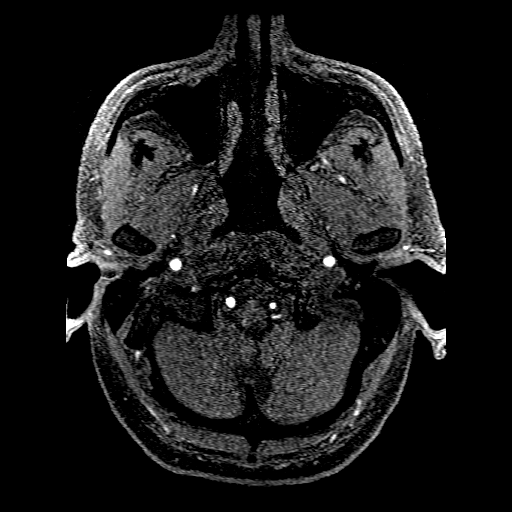
[im 20/176]
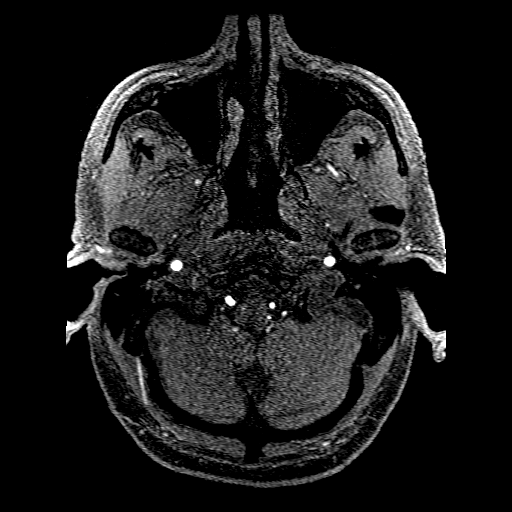
[im 27/176]
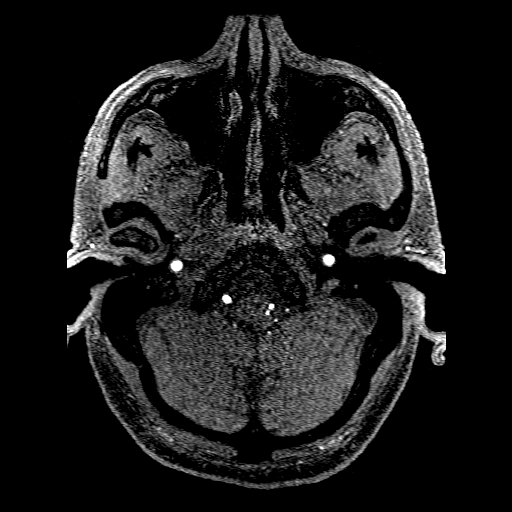
[im 31/176]
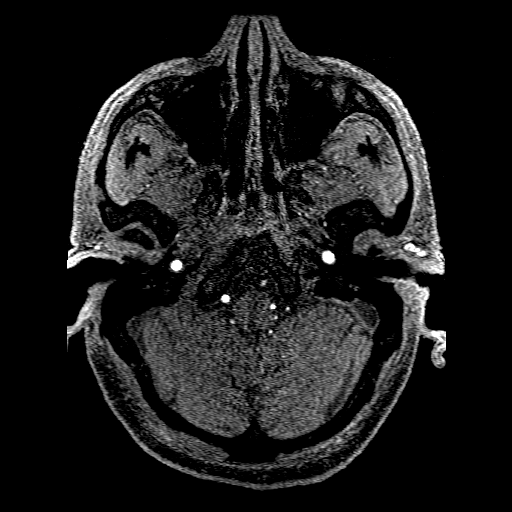
[im 54/176]
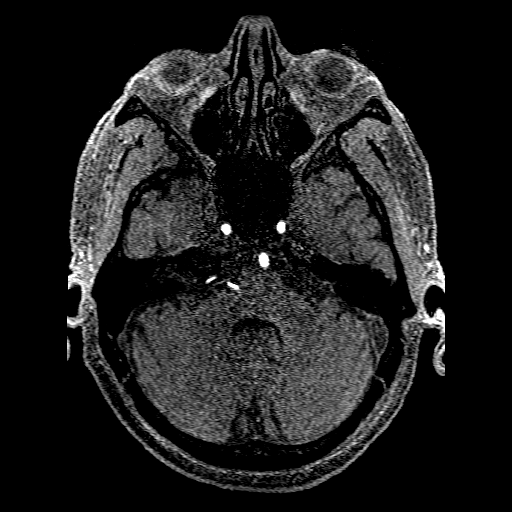
[im 77/176]
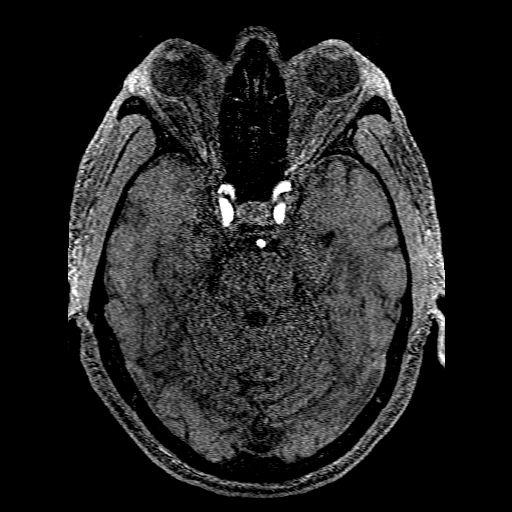
[im 88/176]
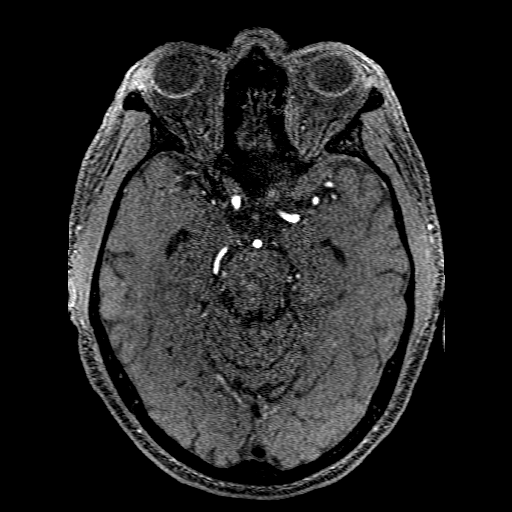
[im 99/176]
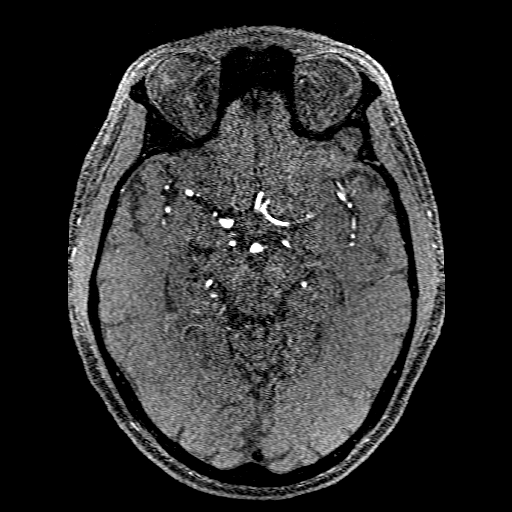
[im 122/176]
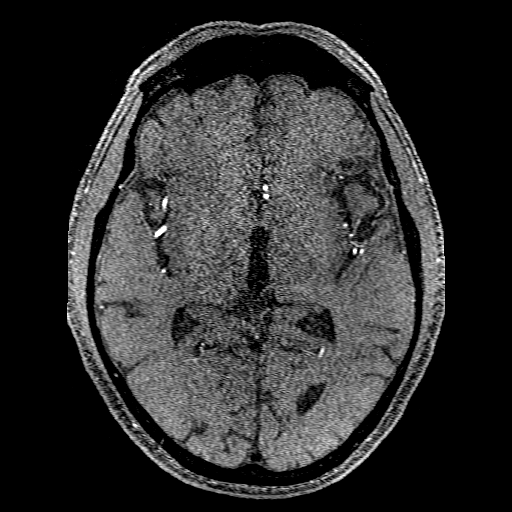
[im 145/176]
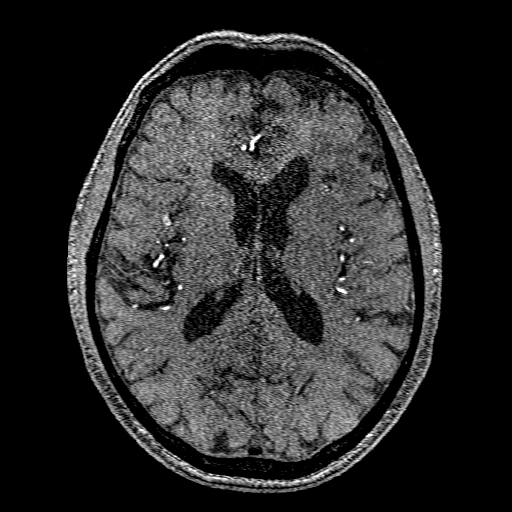
[im 149/176]
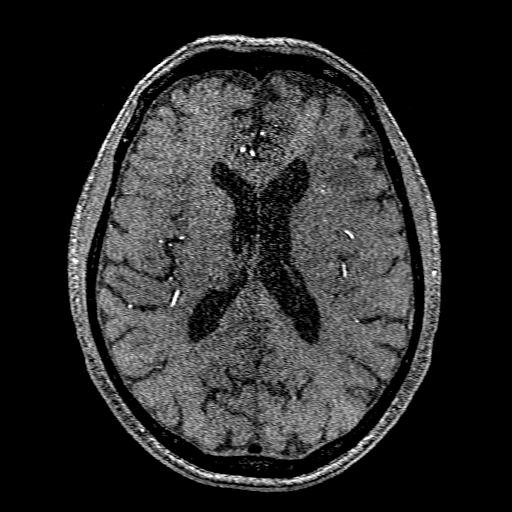
[im 168/176]
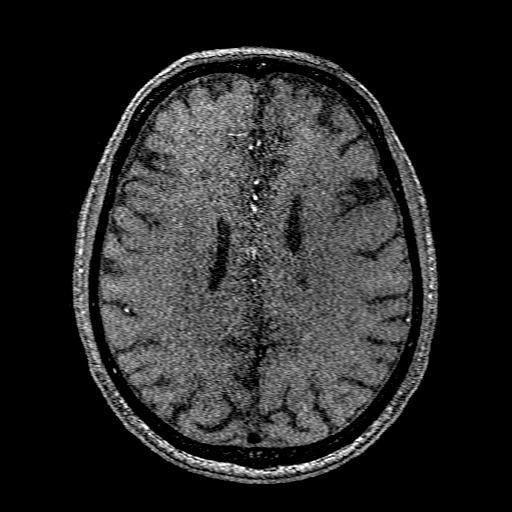

[Series 201: pjn:ax (id) · sagittal · 1.0mm · 0.43mm/px · 1 of 5 slices shown]
[im 1/5]
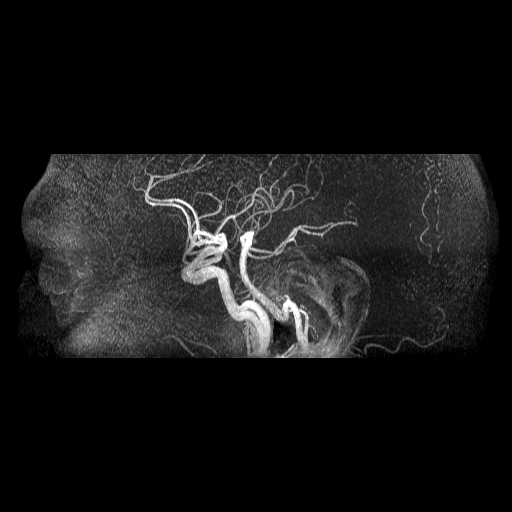

[17 of 48 positions shown; findings below may reference images not displayed]

FINDINGS: MRI HEAD FINDINGS

Brain: Cerebral volume within normal limits for age. Patchy T2/FLAIR
hyperintensity involving the periventricular and deep white matter
both cerebral hemispheres most consistent with chronic small vessel
ischemic disease. Focus of encephalomalacia and gliosis involving
the anterior left frontal operculum consistent with a chronic left
MCA distribution infarct. Associated chronic hemosiderin staining
present at this location. Additional chronic hemorrhagic lacunar
infarct present at the right frontal corona radiata (series 7, image
22).

2 cm focus of curvilinear restricted diffusion involving the left
lentiform and caudate nuclei, consistent with an acute perforator
type infarct (series 3, image 30). Additional punctate focus of
acute ischemia noted inferiorly adjacent to the anterior commissure
(series 3, image 25). No associated hemorrhage or mass effect. No
other diffusion abnormality to suggest acute or subacute ischemia.
Gray-white matter differentiation otherwise maintained. No other
evidence for acute intracranial hemorrhage. Few additional punctate
chronic micro hemorrhages noted, likely hypertensive/small vessel
related.

No mass lesion, midline shift or mass effect. No hydrocephalus or
extra-axial fluid collection. Pituitary gland suprasellar region
within normal limits. Midline structures intact.

Vascular: Major intracranial vascular flow voids are maintained.

Skull and upper cervical spine: Craniocervical junction within
normal limits. Bone marrow signal intensity within normal limits. No
scalp soft tissue abnormality.

Sinuses/Orbits: Globes and orbital soft tissues within normal
limits. Mild scattered mucosal thickening noted within the ethmoidal
air cells. Paranasal sinuses are otherwise clear. No significant
mastoid effusion.

Other: Subtle 4 mm nodular focus seen within the left IAC, which
could reflect a small intracanalicular lesion/mass, possibly a
schwannoma (series 6, image 7).

MRA HEAD FINDINGS

Anterior circulation: Both internal carotid arteries widely patent
to the termini without stenosis. A1 segments widely patent. Normal
anterior communicating artery complex. Both anterior cerebral
arteries widely patent to their distal aspects without stenosis. No
M1 stenosis or occlusion. Normal MCA bifurcations. Short-segment
severe distal left M3 stenosis noted (series 255, image 7). Distal
MCA branches otherwise well perfused and symmetric.

Posterior circulation: Vertebral arteries patent to the
vertebrobasilar junction without stenosis. Right vertebral artery
dominant. Both PICA origins patent and normal. Basilar widely patent
to its distal aspect without stenosis. Superior cerebral arteries
patent bilaterally. Both PCAs primarily supplied via the basilar and
are well perfused to their distal aspects.

Anatomic variants: None significant.  No aneurysm.
IMPRESSION: MRI HEAD IMPRESSION:

1. 2 cm acute ischemic nonhemorrhagic perforator type infarct
involving the left lentiform and caudate nuclei.
2. Additional punctate acute ischemic nonhemorrhagic infarct
inferiorly adjacent to the anterior commissure.
3. Chronic left MCA distribution infarct, with additional chronic
hemorrhagic lacunar infarct involving the right frontal corona
radiata.
4. Underlying mild chronic microvascular ischemic disease.
5. 4 mm nodular focus within the left IAC, which could reflect a
small intracanalicular lesion/mass, possibly a schwannoma.
Nonemergent follow-up examination with dedicated IAC protocol MRI of
the brain suggested for further evaluation.

MRA HEAD IMPRESSION:

1. Negative intracranial MRA for large vessel occlusion.
2. Single short-segment severe distal left M3 stenosis.
3. Otherwise negative intracranial MRA. No other proximal high-grade
or correctable stenosis identified.

## 2021-02-17 IMAGING — CT CT HEAD W/O CM
4 series · 16 of 47 positions shown, 18 images · non-contrast
Comparison: Brain MRI [DATE] (images available, report
unavailable). Head CT [DATE] (images available, report
unavailable).

CLINICAL DATA: Neuro deficit, acute, stroke suspected; stroke
symptoms. Additional history provided: Difficulty walking with
facial droop to right eye, history of stroke 2 years ago.

EXAM:
CT HEAD WITHOUT CONTRAST
TECHNIQUE: Contiguous axial images were obtained from the base of the skull
through the vertex without intravenous contrast.

[Series 3: head without · axial · non-contrast · 0.44mm/px · z∈[-29,+91]mm · 7 of 33 slices shown, 9 images]
[im 5/33  brain]
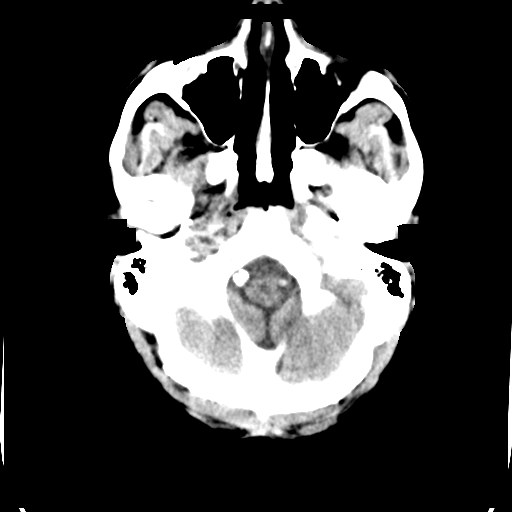
[im 5/33  bone]
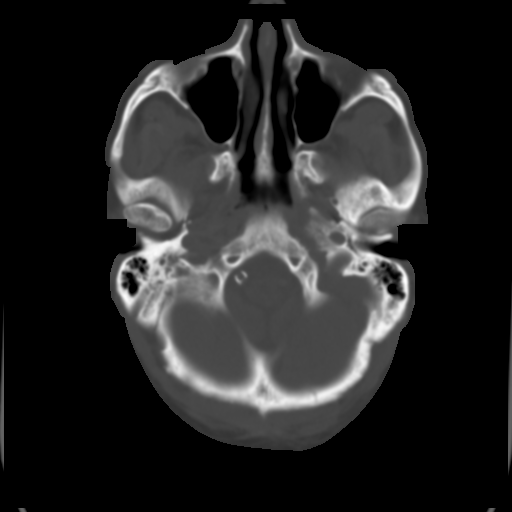
[im 9/33  brain]
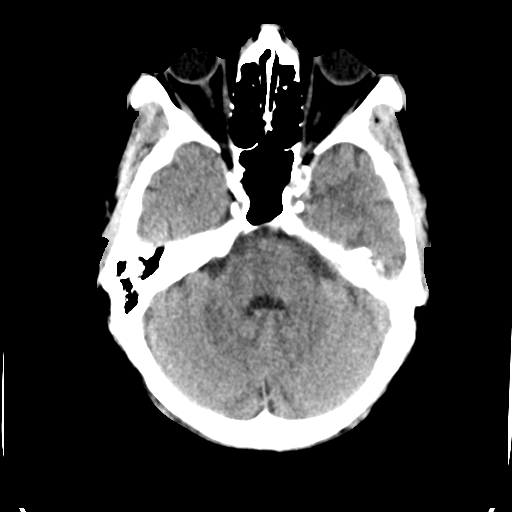
[im 13/33  brain]
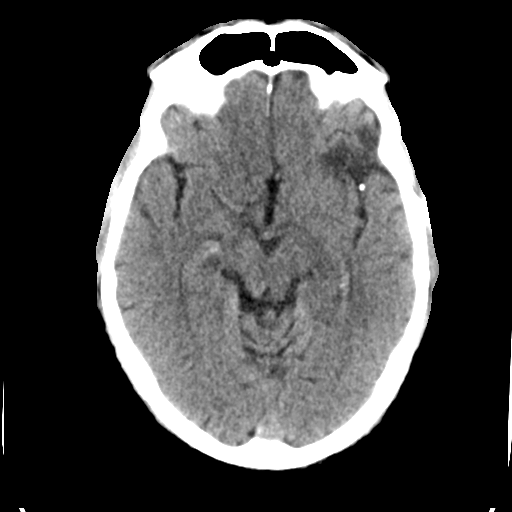
[im 17/33  brain]
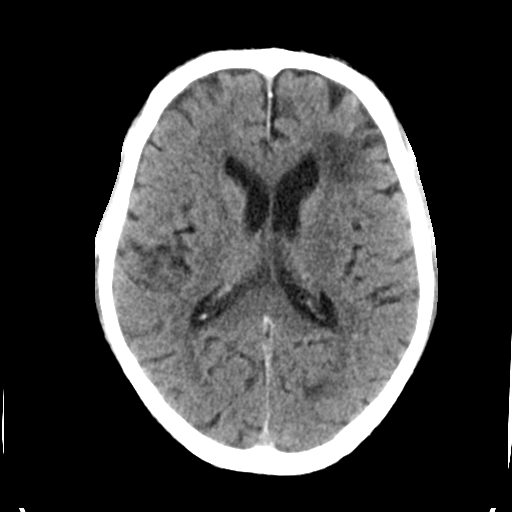
[im 21/33  brain]
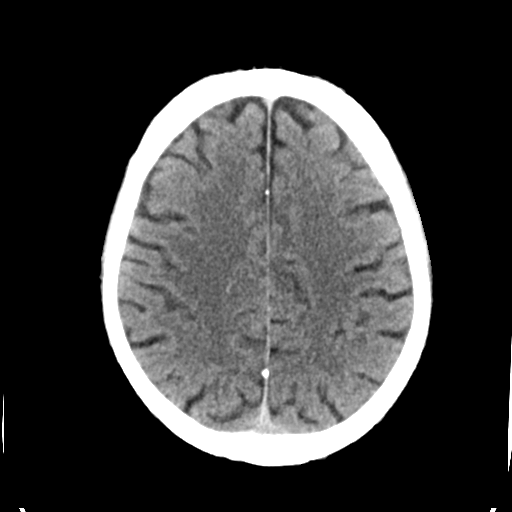
[im 21/33  bone]
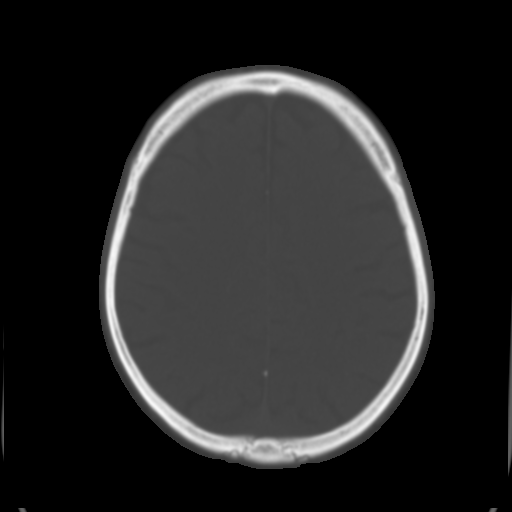
[im 25/33  brain]
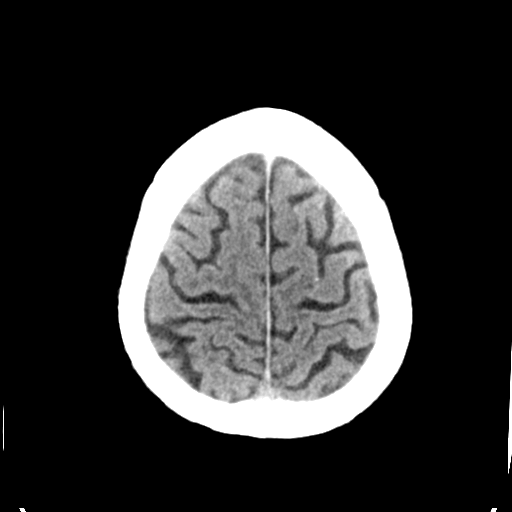
[im 29/33  brain]
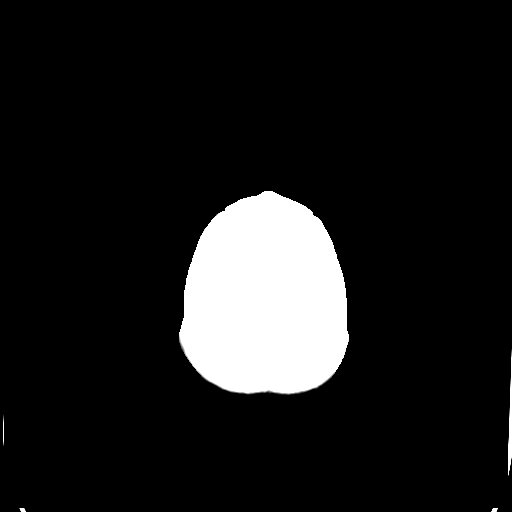

[Series 4: head bone · axial · 0.44mm/px · z∈[-33,-1]mm · 3 of 81 slices shown]
[im 9/81  bone]
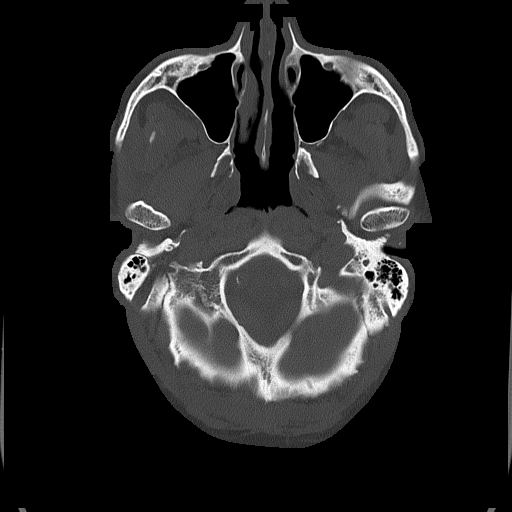
[im 17/81  bone]
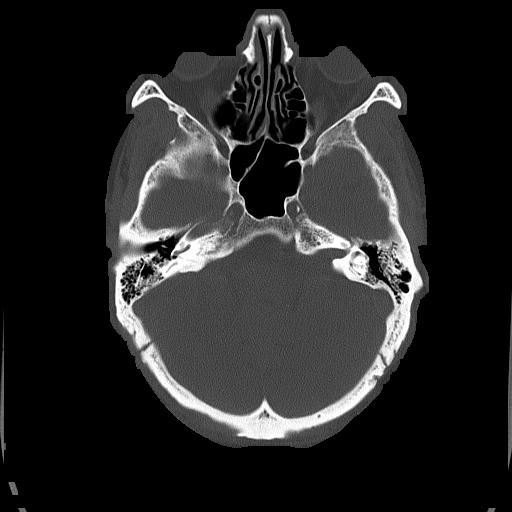
[im 25/81  bone]
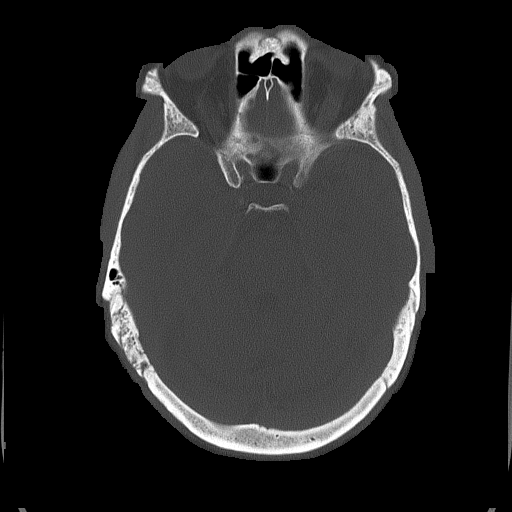

[Series 5: head without cor · coronal · non-contrast · 0.34mm/px · 3 of 74 slices shown]
[im 29/74  brain]
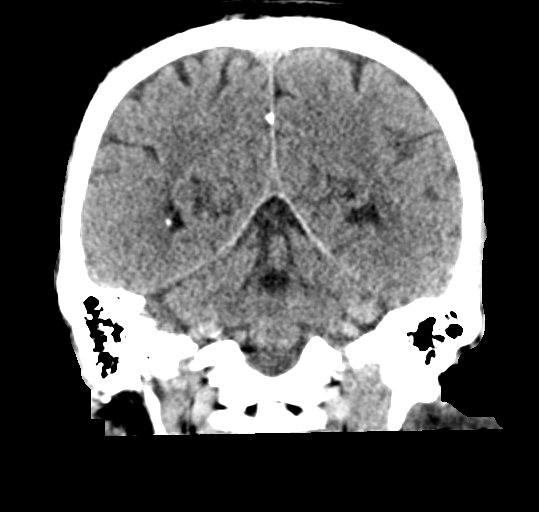
[im 34/74  brain]
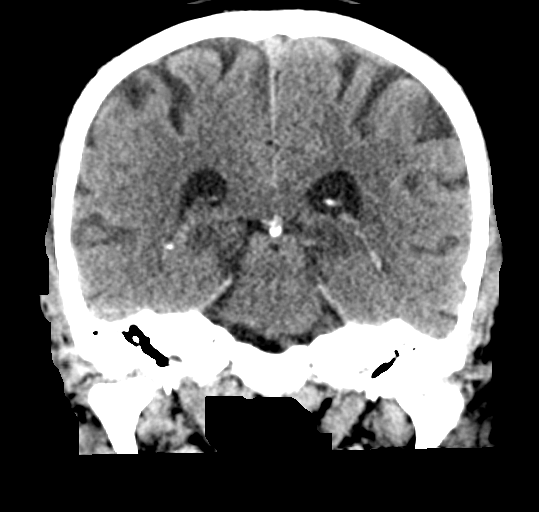
[im 40/74  brain]
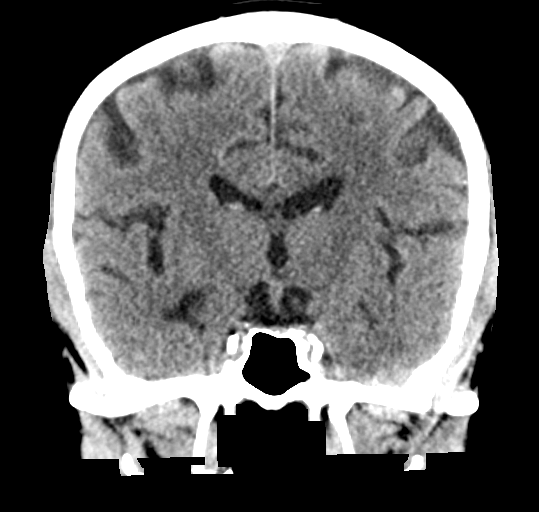

[Series 6: head without sag · sagittal · non-contrast · 0.37mm/px · 3 of 67 slices shown]
[im 23/67  brain]
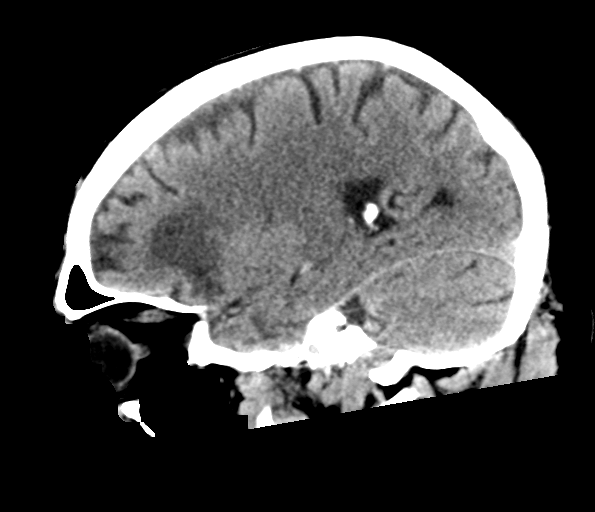
[im 34/67  brain]
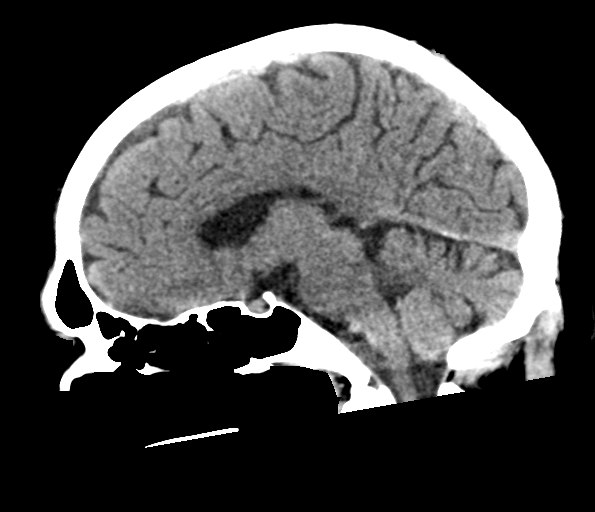
[im 45/67  brain]
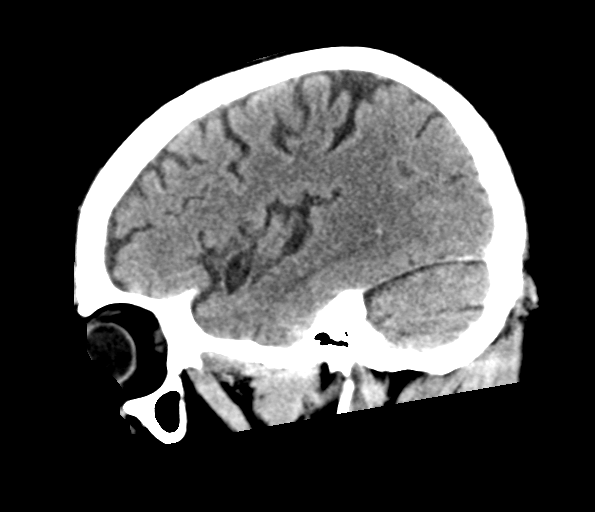

[16 of 47 positions shown; findings below may reference images not displayed]

FINDINGS: Brain:

Mild generalized cerebral atrophy.

Chronic appearing cortical/subcortical infarct within the
anterolateral left frontal lobe and anterior left insula measuring
3.0 x 3.0 x 2.3 cm (AP x TV x CC). This is new as compared to the
brain MRI of [DATE].

There is no acute intracranial hemorrhage.

No demarcated cortical infarct.

No extra-axial fluid collection.

No evidence of an intracranial mass.

No midline shift.

Vascular: Small foci of calcification within the left sylvian
fissure and overlying the lateral left frontal lobe suspicious for
age-indeterminate left middle cerebral artery calcified emboli.
Additional punctate focus of calcification overlying the posterior
left frontal lobe, unchanged as compared to the head CT of
[DATE]. This may reflect an additional calcified embolus.
Atherosclerotic calcification also present within the intracranial
internal carotid arteries and vertebral arteries.

Skull: Normal. Negative for fracture or focal lesion.

Sinuses/Orbits: Visualized orbits show no acute finding. Trace
bilateral ethmoid sinus mucosal thickening. Tiny right sphenoid
sinus mucous retention cyst.
IMPRESSION: 3 cm cortical/subcortical infarct within the anterolateral left
frontal lobe and anterior left insula, new from the brain MRI of
[DATE], but chronic in appearance. Please note a brain MRI would
have greater sensitivity for acute on chronic ischemia at this site.

Small foci of calcification within the left sylvian fissure and
overlying the lateral left frontal lobe suspicious for
age-indeterminate calcified emboli within left middle cerebral
artery branches.

Mild generalized cerebral atrophy.

Minimal paranasal sinus disease at the imaged levels, as described.

## 2021-02-17 IMAGING — MR MR HEAD W/O CM
6 of 10 series · 26 of 48 positions shown · non-contrast
Comparison: CT from earlier the same day.

CLINICAL DATA: Follow-up examination for acute stroke.

EXAM:
MRI HEAD WITHOUT CONTRAST
MRA HEAD WITHOUT CONTRAST
TECHNIQUE: Multiplanar, multi-echo pulse sequences of the brain and surrounding
structures were acquired without intravenous contrast. Angiographic
images of the Circle of Willis were acquired using MRA technique
without intravenous contrast.

[Series 3: DWI · axial · 3.0mm · 0.94mm/px · z∈[-31,+119]mm · 8 of 102 slices shown (1 of 2)]
[im 1/102]
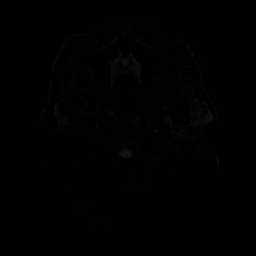
[im 12/102]
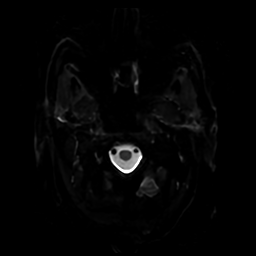
[im 34/102]
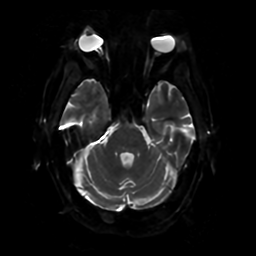
[im 45/102]
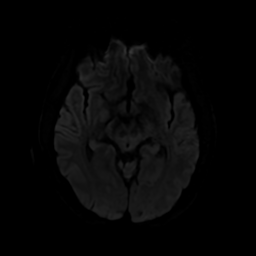
[im 57/102]
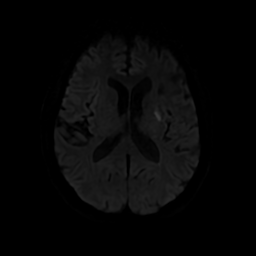
[im 68/102]
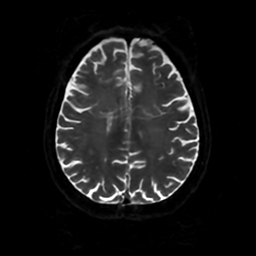
[im 90/102]
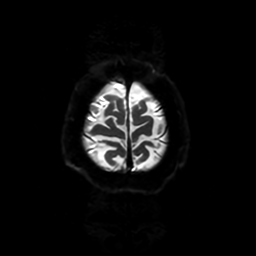
[im 102/102]
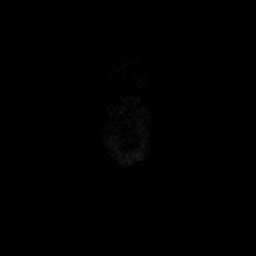

[Series 4: DWI · coronal · 4.0mm · 0.94mm/px · 7 of 77 slices shown (2 of 2)]
[im 1/77]
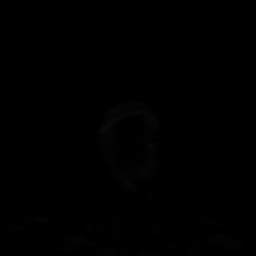
[im 13/77]
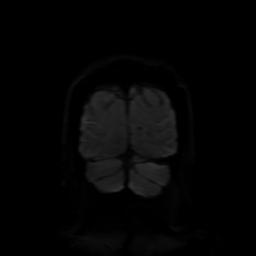
[im 26/77]
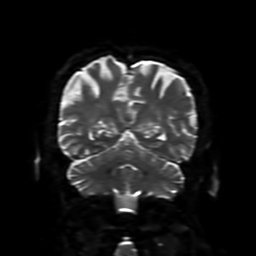
[im 39/77]
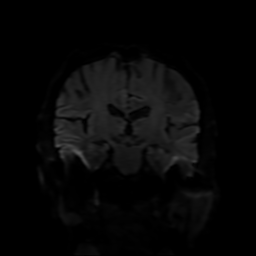
[im 51/77]
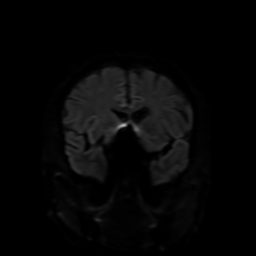
[im 64/77]
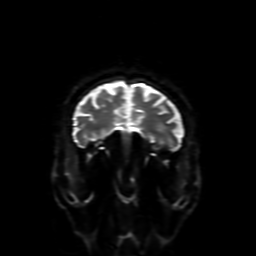
[im 77/77]
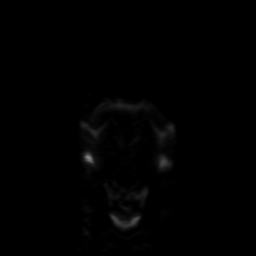

[Series 5: FLAIR · sagittal · 5.0mm · 0.23mm/px · 2 of 25 slices shown (1 of 2)]
[im 1/25]
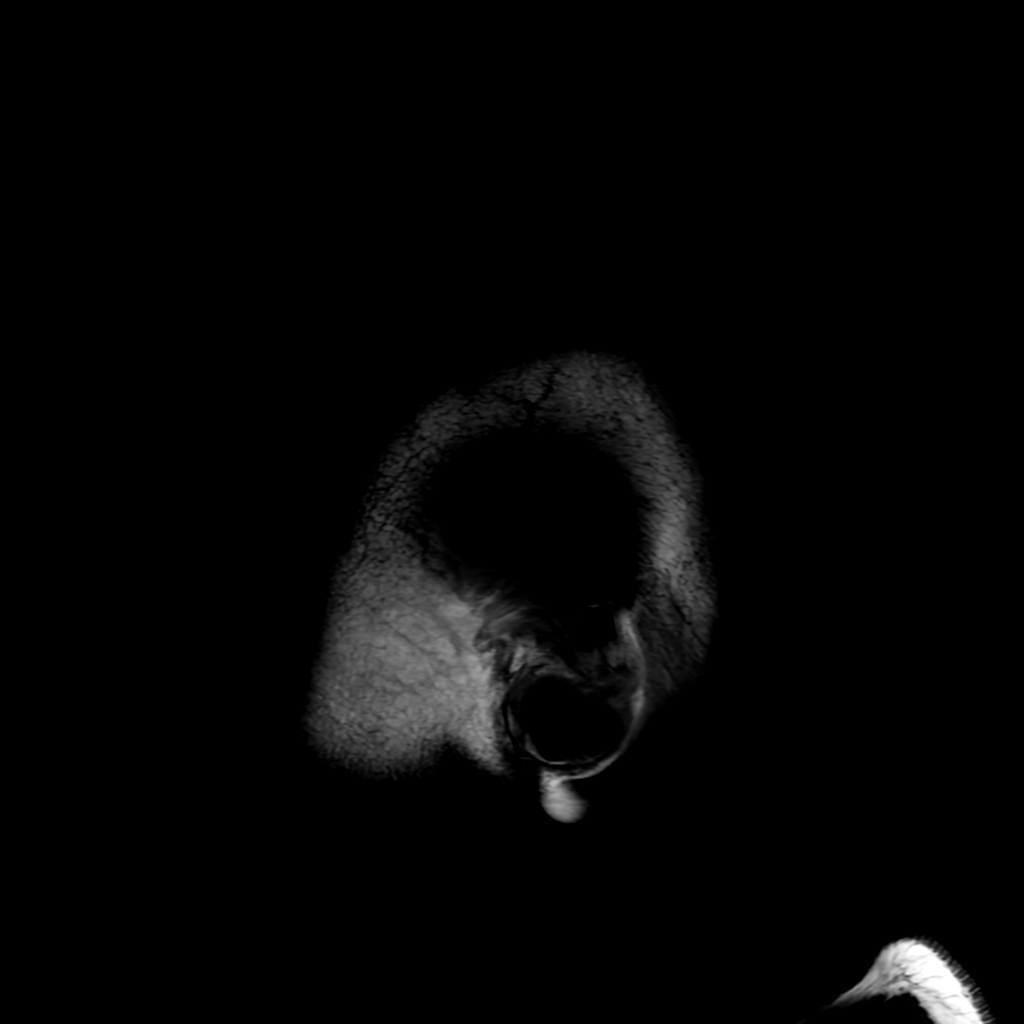
[im 25/25]
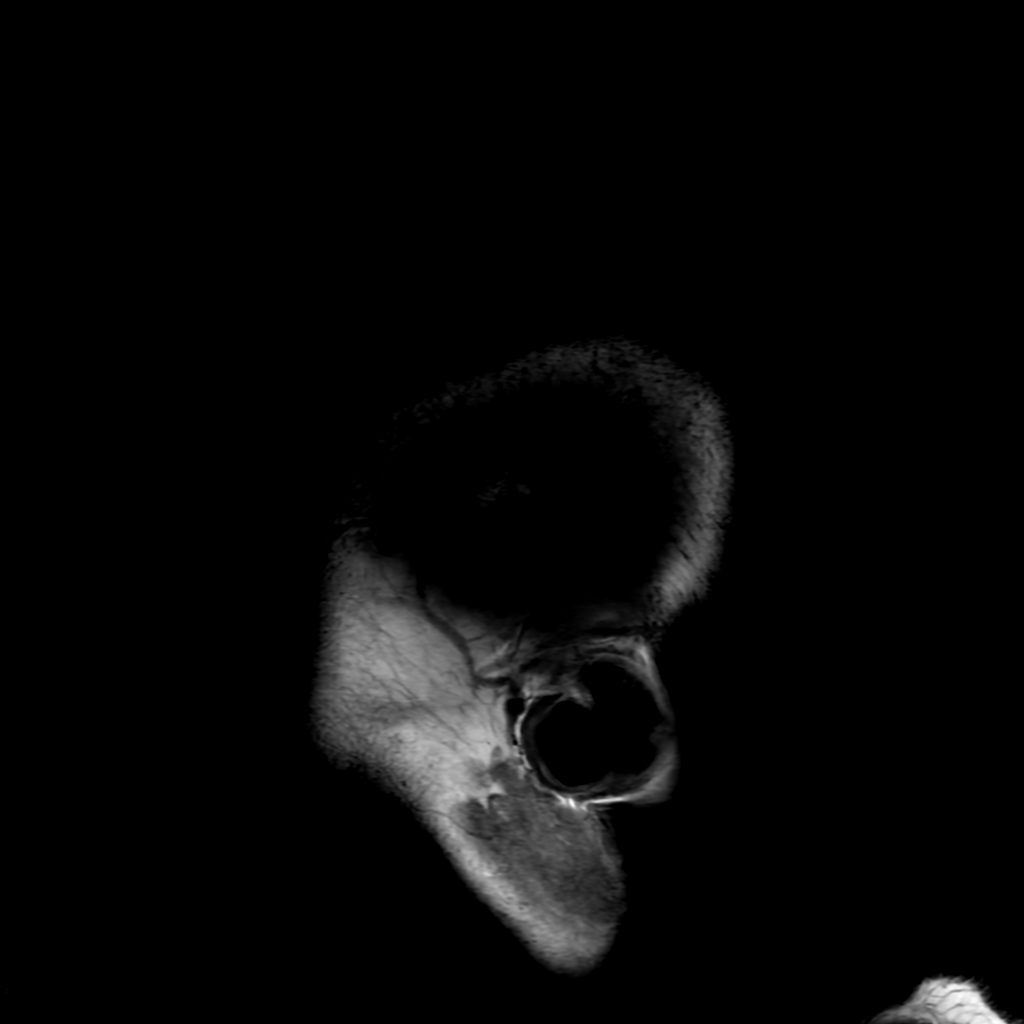

[Series 7: FLAIR · axial · 4.0mm · 0.45mm/px · z∈[-31,+119]mm · 3 of 35 slices shown (2 of 2)]
[im 1/35]
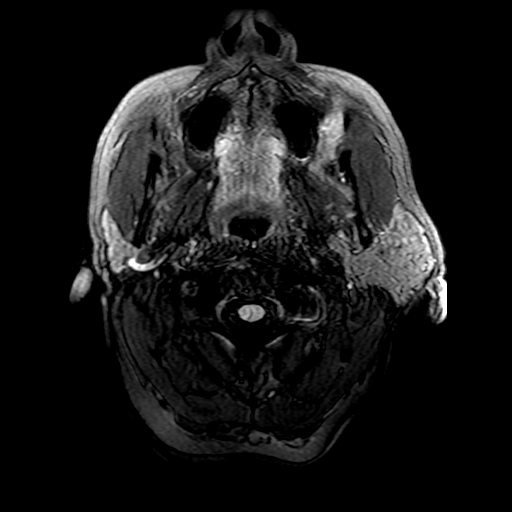
[im 18/35]
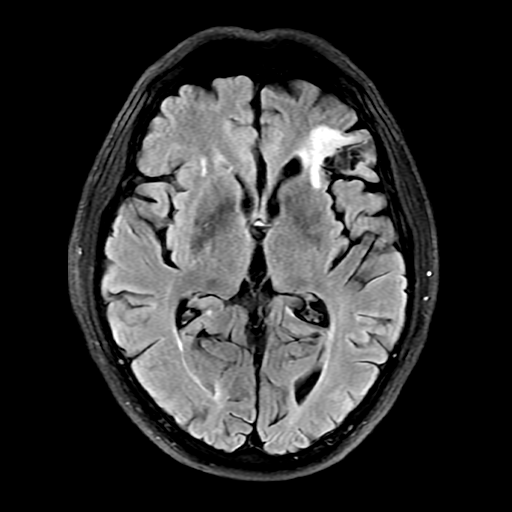
[im 35/35]
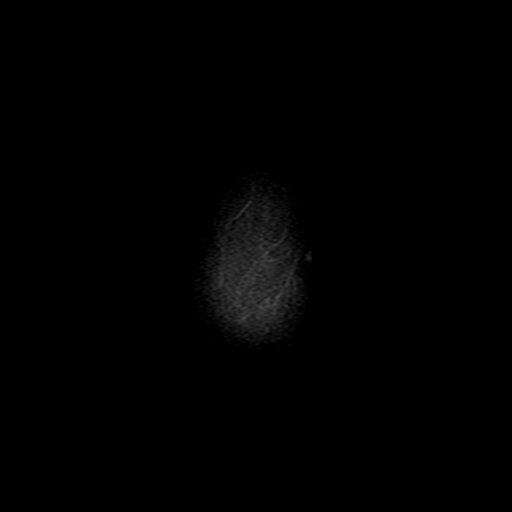

[Series 350: ADC · axial · 3.0mm · 0.94mm/px · z∈[-31,+119]mm · 4 of 50 slices shown (1 of 2)]
[im 1/50]
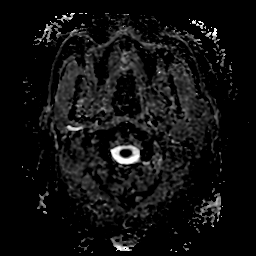
[im 17/50]
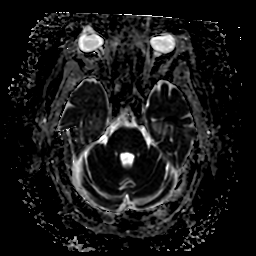
[im 33/50]
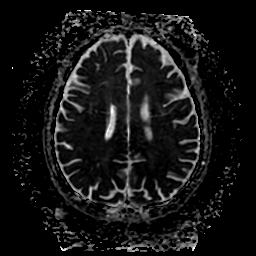
[im 50/50]
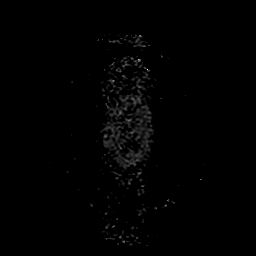

[Series 450: ADC · coronal · 4.0mm · 0.94mm/px · 2 of 39 slices shown (2 of 2)]
[im 1/39]
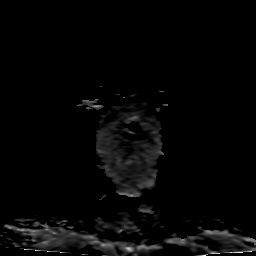
[im 20/39]
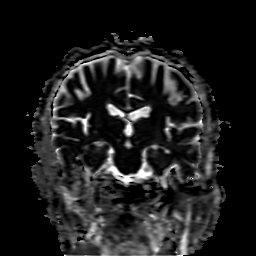

[26 of 48 positions shown; findings below may reference images not displayed]

FINDINGS: MRI HEAD FINDINGS

Brain: Cerebral volume within normal limits for age. Patchy T2/FLAIR
hyperintensity involving the periventricular and deep white matter
both cerebral hemispheres most consistent with chronic small vessel
ischemic disease. Focus of encephalomalacia and gliosis involving
the anterior left frontal operculum consistent with a chronic left
MCA distribution infarct. Associated chronic hemosiderin staining
present at this location. Additional chronic hemorrhagic lacunar
infarct present at the right frontal corona radiata (series 7, image
22).

2 cm focus of curvilinear restricted diffusion involving the left
lentiform and caudate nuclei, consistent with an acute perforator
type infarct (series 3, image 30). Additional punctate focus of
acute ischemia noted inferiorly adjacent to the anterior commissure
(series 3, image 25). No associated hemorrhage or mass effect. No
other diffusion abnormality to suggest acute or subacute ischemia.
Gray-white matter differentiation otherwise maintained. No other
evidence for acute intracranial hemorrhage. Few additional punctate
chronic micro hemorrhages noted, likely hypertensive/small vessel
related.

No mass lesion, midline shift or mass effect. No hydrocephalus or
extra-axial fluid collection. Pituitary gland suprasellar region
within normal limits. Midline structures intact.

Vascular: Major intracranial vascular flow voids are maintained.

Skull and upper cervical spine: Craniocervical junction within
normal limits. Bone marrow signal intensity within normal limits. No
scalp soft tissue abnormality.

Sinuses/Orbits: Globes and orbital soft tissues within normal
limits. Mild scattered mucosal thickening noted within the ethmoidal
air cells. Paranasal sinuses are otherwise clear. No significant
mastoid effusion.

Other: Subtle 4 mm nodular focus seen within the left IAC, which
could reflect a small intracanalicular lesion/mass, possibly a
schwannoma (series 6, image 7).

MRA HEAD FINDINGS

Anterior circulation: Both internal carotid arteries widely patent
to the termini without stenosis. A1 segments widely patent. Normal
anterior communicating artery complex. Both anterior cerebral
arteries widely patent to their distal aspects without stenosis. No
M1 stenosis or occlusion. Normal MCA bifurcations. Short-segment
severe distal left M3 stenosis noted (series 255, image 7). Distal
MCA branches otherwise well perfused and symmetric.

Posterior circulation: Vertebral arteries patent to the
vertebrobasilar junction without stenosis. Right vertebral artery
dominant. Both PICA origins patent and normal. Basilar widely patent
to its distal aspect without stenosis. Superior cerebral arteries
patent bilaterally. Both PCAs primarily supplied via the basilar and
are well perfused to their distal aspects.

Anatomic variants: None significant.  No aneurysm.
IMPRESSION: MRI HEAD IMPRESSION:

1. 2 cm acute ischemic nonhemorrhagic perforator type infarct
involving the left lentiform and caudate nuclei.
2. Additional punctate acute ischemic nonhemorrhagic infarct
inferiorly adjacent to the anterior commissure.
3. Chronic left MCA distribution infarct, with additional chronic
hemorrhagic lacunar infarct involving the right frontal corona
radiata.
4. Underlying mild chronic microvascular ischemic disease.
5. 4 mm nodular focus within the left IAC, which could reflect a
small intracanalicular lesion/mass, possibly a schwannoma.
Nonemergent follow-up examination with dedicated IAC protocol MRI of
the brain suggested for further evaluation.

MRA HEAD IMPRESSION:

1. Negative intracranial MRA for large vessel occlusion.
2. Single short-segment severe distal left M3 stenosis.
3. Otherwise negative intracranial MRA. No other proximal high-grade
or correctable stenosis identified.

## 2021-02-17 MED ORDER — ENOXAPARIN SODIUM 40 MG/0.4ML IJ SOSY
40.0000 mg | PREFILLED_SYRINGE | INTRAMUSCULAR | Status: DC
  Administered 2021-02-17 – 2021-02-18 (×2): 40 mg via SUBCUTANEOUS
  Filled 2021-02-17 (×2): qty 0.4

## 2021-02-17 MED ORDER — ATORVASTATIN CALCIUM 80 MG PO TABS
80.0000 mg | ORAL_TABLET | Freq: Every evening | ORAL | Status: DC
  Administered 2021-02-17: 80 mg via ORAL
  Filled 2021-02-17: qty 1

## 2021-02-17 MED ORDER — SENNOSIDES-DOCUSATE SODIUM 8.6-50 MG PO TABS: 1.0000 | ORAL_TABLET | Freq: Every evening | ORAL | Status: DC | PRN

## 2021-02-17 MED ORDER — CLOPIDOGREL BISULFATE 75 MG PO TABS
75.0000 mg | ORAL_TABLET | Freq: Every day | ORAL | Status: DC
  Administered 2021-02-18: 75 mg via ORAL
  Filled 2021-02-17: qty 1

## 2021-02-17 MED ORDER — ACETAMINOPHEN 650 MG RE SUPP: 650.0000 mg | RECTAL | Status: DC | PRN

## 2021-02-17 MED ORDER — ASPIRIN EC 81 MG PO TBEC
81.0000 mg | DELAYED_RELEASE_TABLET | Freq: Every day | ORAL | Status: DC
  Administered 2021-02-18: 81 mg via ORAL
  Filled 2021-02-17: qty 1

## 2021-02-17 MED ORDER — ACETAMINOPHEN 160 MG/5ML PO SOLN: 650.0000 mg | ORAL | Status: DC | PRN

## 2021-02-17 MED ORDER — ACETAMINOPHEN 325 MG PO TABS: 650.0000 mg | ORAL_TABLET | ORAL | Status: DC | PRN

## 2021-02-17 MED ORDER — STROKE: EARLY STAGES OF RECOVERY BOOK
Freq: Once | Status: AC
  Filled 2021-02-17: qty 1

## 2021-02-17 MED ORDER — SODIUM CHLORIDE 0.9 % IV SOLN: INTRAVENOUS | Status: DC

## 2021-02-17 NOTE — Telephone Encounter (Signed)
Spoke with pt wife (Ok per [employer]) in regards to possible stroke yesterday.  She reports that pt c/o not feeling good yesterday.  He could not process his thoughts and his speech was slow.  She reports that his handwriting was off also.  I advised her to call 911 to have pt evaluated immediately.  She expressed that she would.

## 2021-02-17 NOTE — ED Notes (Signed)
Patient transported to MRI 

## 2021-02-17 NOTE — ED Notes (Signed)
Patient in MRI 

## 2021-02-17 NOTE — ED Notes (Signed)
Report given to Elyse Jarvis, RN

## 2021-02-17 NOTE — ED Triage Notes (Signed)
Pt brought in via [facility] with c/c of Stroke symptoms, from home, [facility] 9/8 1000. Pt noticed had difficulty walking with some struggling to right, facial droop to right eye. Pt hx of stroke 2 years ago and MI last year. Pt tried witting something last night.   150/74, 55HR, 98%RA, 144CBG

## 2021-02-17 NOTE — H&P (Addendum)
History and Physical    Alan Mcdonald DOB: 12-26-1950 DOA: 02/17/2021  PCP: [facility] Consultants:  [facility] - cardiology; [place] - podiatry; [place] Patient coming from:  Home - lives with wife, Alan Mcdonald; [place]: Wife, [phone]  Chief Complaint: stroke symptoms  HPI: Alan Mcdonald is a 70 y.o. male with medical history significant of HTN; HLD; L acoustic neuroma; and CAD s/p CABG and AVR presenting with stroke symptoms.   Yesterday AM he was working out and noticed that he was "cloudy".  His thoughts were confused, couldn't comprehend what he was reading on the computer.  His speech was a little slurred but he didn't think too much about it.  He went to the store with his wife and she was concerned but he didn't think it was a big deal.  He got a flu shot and couldn't sleep last night.  He finally went to sleep.  This AM, his handwriting was off.  It was "off" when filling out his paperwork for the shot too.  He is not aware of prior CVA - he had an episode of aphasia in the past and then noticed a black spot in his eye and they thought it was due to a stroke.  It went away in a couple of weeks.  He takes ASA + Plavix daily.  He had a CABG 1 year ago.     ED Course: LKW yesterday.  Dysgraphia, difficulty reading.  Has L frontal lobe CVA, ?subacute.  Needs MRI.  Symptoms have improved/resolved.  Review of Systems: As per HPI; otherwise review of systems reviewed and negative.   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   LIMA to LAD SVG to OM    Past Surgical History:  Procedure Laterality  Date   AORTIC VALVE REPLACEMENT N/A 02/22/2020   Procedure: AORTIC VALVE REPLACEMENT (AVR) USING EDWARDS Resilia 25 MM AORTIC VALVE.;  Surgeon: Rexene Alberts, MD;  Location: [place];  Service: Open Heart Surgery;  Laterality: N/A;   CARDIAC CATHETERIZATION     CERVICAL SPINE SURGERY     CHOLECYSTECTOMY     CORONARY ARTERY BYPASS GRAFT N/A 02/22/2020   Procedure: CORONARY ARTERY BYPASS GRAFTING (CABG) USING LIMA to LAD; ENDSCOPICALLY HARVEST RIGHT GREATER SAPHENOUS VEIN: SVG to OM1;  Surgeon: Rexene Alberts, MD;  Location: [place];  Service: Open Heart Surgery;  Laterality: N/A;   ELBOW SURGERY     ENDOVEIN HARVEST OF GREATER SAPHENOUS VEIN Right 02/22/2020   Procedure: ENDOVEIN HARVEST OF GREATER SAPHENOUS VEIN;  Surgeon: Rexene Alberts, MD;  Location: [place];  Service: Open Heart Surgery;  Laterality: Right;   KIDNEY STONE SURGERY     LEFT HEART CATH AND CORONARY ANGIOGRAPHY N/A 02/22/2020   Procedure: LEFT HEART CATH AND CORONARY ANGIOGRAPHY;  Surgeon: Belva Crome, MD;  Location: [place] [facility];  Service: Cardiovascular;  Laterality: N/A;   TEE WITHOUT CARDIOVERSION N/A 02/22/2020   Procedure: TRANSESOPHAGEAL ECHOCARDIOGRAM (TEE);  Surgeon: Rexene Alberts, MD;  Location: [place];  Service: Open Heart Surgery;  Laterality: N/A;  Social History   Socioeconomic History   Marital status: Married    Spouse name: Not on file   Number of children: Not on file   Years of education: 14   Highest education level: Associate degree: occupational, Hotel manager, or vocational program  Occupational History   Occupation: semi retired  Tobacco Use   Smoking status: Former    Types: Cigarettes    Quit date: 2003    Years since quitting: 19.7   Smokeless tobacco: Never  Vaping Use   Vaping Use: Never used  Substance and Sexual Activity   Alcohol use: Yes    Comment: occasional   Drug use: Never   Sexual activity: Not on file  Other Topics Concern   Not on file  Social History Narrative    Not on file   Social Determinants of Health   Financial Resource Strain: Not on file  Food Insecurity: Not on file  Transportation Needs: Not on file  Physical Activity: Not on file  Stress: Not on file  Social Connections: Not on file  Intimate Partner Violence: Not on file    No Known Allergies  No family history on file.  Prior to Admission medications   Medication Sig Start Date End Date Taking? Authorizing Provider  acetaminophen (TYLENOL) 500 MG tablet Take 500 mg by mouth every 6 (six) hours as needed for mild pain or moderate pain.    [provider]  aspirin EC 81 MG tablet Take 81 mg by mouth daily.  02/23/14   [provider]  atorvastatin (LIPITOR) 80 MG tablet Take 1 tablet (80 mg total) by mouth every evening. 02/26/20   Gold, Patrick Jupiter E, PA-C  B Complex Vitamins (VITAMIN B-COMPLEX) TABS Take 1 tablet by mouth daily.     [provider]  carvedilol (COREG) 3.125 MG tablet Take 1 tablet (3.125 mg total) by mouth 2 (two) times daily with a meal. 02/26/20   Gold, Wilder Glade, PA-C  cholecalciferol (VITAMIN D3) 25 MCG (1000 UNIT) tablet Take 1,000 Units by mouth daily.    [provider]  clopidogrel (PLAVIX) 75 MG tablet Take 75 mg by mouth daily.    [provider]  Coenzyme Q10 (CO Q-10) 200 MG CAPS Take 1 tablet by mouth daily.    [provider]  glucosamine-chondroitin 500-400 MG tablet Take 1 tablet by mouth daily.    [provider]  losartan (COZAAR) 100 MG tablet Take 1 tablet (100 mg total) by mouth daily. 04/04/20   Jerline Pain, MD  Multiple Vitamin (MULTIVITAMIN WITH MINERALS) TABS tablet Take 1 tablet by mouth daily.    [provider]  RA KRILL OIL 500 MG CAPS Take by mouth.    [provider]  Turmeric 500 MG CAPS Take 1 tablet by mouth daily.    [provider]  Zinc 30 MG CAPS Take 1 capsule by mouth daily.    [provider]    Physical Exam: Vitals:    02/17/21 1215 02/17/21 1230 02/17/21 1245 02/17/21 1300  BP: (!) 143/70 (!) 142/64 128/64 135/70  Pulse: (!) 50 (!) 51 (!) 50 (!) 51  Resp: 20 (!) 22 19 (!) 22  Temp:      TempSrc:      SpO2: 99% 99% 98% 97%  Weight:      Height:         General:  Appears calm and comfortable and is in NAD, standing at bedside when I entered the room Eyes:  PERRL, EOMI, normal lids, iris ENT:  grossly normal hearing, lips & tongue, mmm Neck:  no LAD, masses or thyromegaly Cardiovascular:  RR with mild bradycardia. No LE edema.  Respiratory:   CTA bilaterally with no wheezes/rales/rhonchi.  Normal respiratory effort. Abdomen:  soft, NT, ND Skin:  no rash or induration seen on limited exam Musculoskeletal:  grossly normal tone BUE/BLE, good ROM, no bony abnormality Psychiatric:  grossly normal mood and affect, speech fluent and appropriate, AOx3 Neurologic:  CN 2-12 grossly intact, moves all extremities in coordinated fashion, sensation intact    Radiological Exams on Admission: Independently reviewed - see discussion in A/P where applicable  CT HEAD WO CONTRAST  Result Date: 02/17/2021 CLINICAL DATA:  Neuro deficit, acute, stroke suspected; stroke symptoms. Additional history provided: Difficulty walking with facial droop to right eye, history of stroke 2 years ago. EXAM: CT HEAD WITHOUT CONTRAST TECHNIQUE: Contiguous axial images were obtained from the base of the skull through the vertex without intravenous contrast. COMPARISON:  Brain MRI 11/29/2016 (images available, report unavailable). Head CT 02/23/2016 (images available, report unavailable). FINDINGS: Brain: Mild generalized cerebral atrophy. Chronic appearing cortical/subcortical infarct within the anterolateral left frontal lobe and anterior left insula measuring 3.0 x 3.0 x 2.3 cm (AP x TV x CC). This is new as compared to the brain MRI of 11/29/2016. There is no acute intracranial hemorrhage. No demarcated cortical infarct. No extra-axial  fluid collection. No evidence of an intracranial mass. No midline shift. Vascular: Small foci of calcification within the left sylvian fissure and overlying the lateral left frontal lobe suspicious for age-indeterminate left middle cerebral artery calcified emboli. Additional punctate focus of calcification overlying the posterior left frontal lobe, unchanged as compared to the head CT of 02/23/2016. This may reflect an additional calcified embolus. Atherosclerotic calcification also present within the intracranial internal carotid arteries and vertebral arteries. Skull: Normal. Negative for fracture or focal lesion. Sinuses/Orbits: Visualized orbits show no acute finding. Trace bilateral ethmoid sinus mucosal thickening. Tiny right sphenoid sinus mucous retention cyst. IMPRESSION: 3 cm cortical/subcortical infarct within the anterolateral left frontal lobe and anterior left insula, new from the brain MRI of 11/29/2016, but chronic in appearance. Please note a brain MRI would have greater sensitivity for acute on chronic ischemia at this site. Small foci of calcification within the left sylvian fissure and overlying the lateral left frontal lobe suspicious for age-indeterminate calcified emboli within left middle cerebral artery branches. Mild generalized cerebral atrophy. Minimal paranasal sinus disease at the imaged levels, as described. Electronically Signed   By: Kellie Simmering D.O.   On: 02/17/2021 12:03    EKG: Independently reviewed.  NSR with rate 53; RBBB with no evidence of acute ischemia   Labs on Admission: I have personally reviewed the available labs and imaging studies at the time of the admission.  Pertinent labs:   Glucose 138 WBC 6.3 Platelets 98 UA WNL UDS + THC   Assessment/Plan Active Problems:   * No active hospital problems. *   Possible CVA -Patient presenting with 24 hours of stroke-like symptoms including dysgraphia, difficulty reading -Concerning for CVA -Aspirin has  been given to reduce stroke mortality and decrease morbidity -Will place in observation status for CVA/TIA evaluation -Telemetry monitoring -MRI/MRA -If the patient does not have known afib and this is not detected on telemetry during hospitalization, consider outpatient Holter monitoring and/or loop recorder placement. -Risk stratification with FLP, A1c; will also check UDS -Patient is already taking DAPT and reports compliance -Neurology consult -PT/OT/ST/Nutrition  Consults  HTN -Allow permissive HTN for now -Treat BP only if >220/120, and then with goal of 15% reduction -Hold Coreg and Cozaar and plan to restart in 48-72 hours   HLD -Check FLP -Continue Lipitor 80 mg daily   CAD -s/p CABG and AVR in 02/2020 -No current c/o CP  Marijuana abuse  -Cessation encouraged; this should be encouraged on an ongoing basis -He was very reluctant to admit this but eventually did so    Note: This patient has been tested and is negative for the novel coronavirus COVID-19. He has been fully vaccinated against COVID-19.    DVT prophylaxis:  Lovenox  Code Status: Full - confirmed with patient Family Communication: None present Disposition Plan:  The patient is from: home  Anticipated d/c is to: home without [facility] services   Anticipated d/c date will depend on clinical response to treatment, but possibly as early as tomorrow if he has excellent response to treatment  Patient is currently: acutely ill Consults called: Neurology; PT/OT/ST/Nutrition; [facility] team Admission status: It is my clinical opinion that referral for OBSERVATION is reasonable and necessary in this patient based on the above information provided. The aforementioned taken together are felt to place the patient at high risk for further clinical deterioration. However it is anticipated that the patient may be medically stable for discharge from the hospital within 24 to 48 hours.      Karmen Bongo MD [facility]   How to contact the [facility] Attending or Consulting provider [place] or covering provider during after hours [place], for this patient?  Check the care team in [facility] and look for a) attending/consulting [facility] provider listed and b) the [facility] team listed Log into [email or web address] and use [place]'s universal password to access. If you do not have the password, please contact the hospital operator. Locate the [facility] provider you are looking for under [facility] and page to a number that you can be directly reached. If you still have difficulty reaching the provider, please page the [facility] (Director on Call) for the Hospitalists listed on amion for assistance.   02/17/2021, 1:43 PM

## 2021-02-17 NOTE — ED Provider Notes (Signed)
[place] EMERGENCY DEPARTMENT Provider Note   CSN: [id number] Arrival date & time: 02/17/21  1023     History Chief Complaint  Patient presents with   Stroke Symptoms    Pt brought in via [facility] with c/c of Stroke symptoms, from home, [facility] 9/8 1000. Pt noticed had difficulty walking with some struggling to right, facial droop to right eye. Pt hx of stroke 2 years ago and MI last year. Pt tried witting something last night.   150/74, 55HR, 98%RA, 144CBG     Alan Mcdonald is a 70 y.o. male who presents to the emergency department for evaluation of stroke symptoms.  Patient states that last known well was yesterday at 10 AM when he noticed difficulty walking and significant difficulty with reading writing and typing.  These symptoms have since resolved but he spoke with his cardiologist today who recommended that he come to the emergency department.  On arrival to the emergency department, the patient has no complaints of numbness, tingling, weakness and is able to read and write without difficulty.  Initial NIH 0 on my exam.  Denies chest pain, shortness of breath, abdominal pain, nausea, vomiting or other systemic symptoms.  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   Bilateral tinnitus 02/06/2021   Cough 02/06/2021   Disease of intestine, unspecified 02/06/2021   Encounter for fitting and adjustment of hearing aid 02/06/2021   Former heavy tobacco  smoker 02/06/2021   History of cholecystectomy 02/06/2021   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   Insomnia, unspecified 02/06/2021   Atypical chest pain 02/06/2021   Encounter for follow-up examination after completed treatment for conditions other than malignant neoplasm 02/06/2021   Pain in right knee 02/06/2021   Parkinson's disease ([place]) 02/06/2021   Posterior tibial tendinitis, right leg 02/06/2021   Postnasal drip 02/06/2021   Tremor, unspecified 02/06/2021   Aortic stenosis [id number]   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   Coronary artery disease 02/22/2020   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   S/P aortic valve replacement with bioprosthetic valve 02/22/2020   Unstable angina (HCC)    NSTEMI (non-ST elevated myocardial infarction) ([place]) 02/21/2020   Body mass index (BMI) 28.0-28.9, adult 01/18/2020   Essential (primary) hypertension 01/18/2020   Lumbar radiculopathy 01/18/2020   Asymmetrical hearing loss of both ears 02/21/2017   Chronic low back pain 06/07/2016   Family history of coronary artery disease 09/23/2014   Hyperlipidemia 09/23/2014   S/P coronary artery stent placement 09/23/2014   Cellulitis and abscess of hand 08/31/2014   Rash 04/23/2014   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    Past Surgical History:  Procedure Laterality Date   AORTIC VALVE REPLACEMENT N/A 02/22/2020   Procedure: AORTIC VALVE REPLACEMENT (AVR) USING EDWARDS Resilia 25 MM AORTIC VALVE.;  Surgeon: Rexene Alberts, MD;  Location: [facility];  Service: Open Heart Surgery;  Laterality: N/A;   CARDIAC CATHETERIZATION  CERVICAL SPINE SURGERY     CHOLECYSTECTOMY     CORONARY ARTERY BYPASS GRAFT N/A 02/22/2020   Procedure: CORONARY ARTERY BYPASS GRAFTING (CABG) USING LIMA to LAD; ENDSCOPICALLY HARVEST RIGHT GREATER SAPHENOUS VEIN: SVG to OM1;  Surgeon: Rexene Alberts, MD;  Location: [facility];  Service: Open Heart Surgery;  Laterality: N/A;   ELBOW SURGERY     ENDOVEIN HARVEST OF GREATER SAPHENOUS VEIN  Right 02/22/2020   Procedure: ENDOVEIN HARVEST OF GREATER SAPHENOUS VEIN;  Surgeon: Rexene Alberts, MD;  Location: [facility];  Service: Open Heart Surgery;  Laterality: Right;   KIDNEY STONE SURGERY     LEFT HEART CATH AND CORONARY ANGIOGRAPHY N/A 02/22/2020   Procedure: LEFT HEART CATH AND CORONARY ANGIOGRAPHY;  Surgeon: Belva Crome, MD;  Location: [facility];  Service: Cardiovascular;  Laterality: N/A;   TEE WITHOUT CARDIOVERSION N/A 02/22/2020   Procedure: TRANSESOPHAGEAL ECHOCARDIOGRAM (TEE);  Surgeon: Rexene Alberts, MD;  Location: [place] [facility];  Service: Open Heart Surgery;  Laterality: N/A;       History reviewed. No pertinent family history.  Social History   Tobacco Use   Smoking status: Former    Packs/day: 0.50    Years: 25.00    Pack years: 12.50    Types: Cigarettes    Quit date: 2003    Years since quitting: 19.7   Smokeless tobacco: Never  Vaping Use   Vaping Use: Never used  Substance Use Topics   Alcohol use: Yes    Comment: occasional   Drug use: Never    Home Medications Prior to Admission medications   Medication Sig Start Date End Date Taking? Authorizing Provider  acetaminophen (TYLENOL) 500 MG tablet Take 500 mg by mouth every 6 (six) hours as needed for mild pain or moderate pain.   Yes [provider]  aspirin EC 81 MG tablet Take 81 mg by mouth daily.  02/23/14  Yes [provider]  atorvastatin (LIPITOR) 80 MG tablet Take 1 tablet (80 mg total) by mouth every evening. 02/26/20  Yes Gold, Wayne E, PA-C  B Complex Vitamins (VITAMIN B-COMPLEX) TABS Take 1 tablet by mouth daily.    Yes [provider]  carvedilol (COREG) 3.125 MG tablet Take 1 tablet (3.125 mg total) by mouth 2 (two) times daily with a meal. 02/26/20  Yes Gold, Wayne E, PA-C  cholecalciferol (VITAMIN D3) 25 MCG (1000 UNIT) tablet Take 1,000 Units by mouth daily.   Yes [provider]  clopidogrel (PLAVIX) 75 MG tablet Take 75 mg by mouth daily.   Yes  [provider]  Coenzyme Q10 (CO Q-10) 200 MG CAPS Take 1 tablet by mouth daily.   Yes [provider]  glucosamine-chondroitin 500-400 MG tablet Take 1 tablet by mouth daily.   Yes [provider]  losartan (COZAAR) 100 MG tablet Take 1 tablet (100 mg total) by mouth daily. 04/04/20  Yes Jerline Pain, MD  Multiple Vitamin (MULTIVITAMIN WITH MINERALS) TABS tablet Take 1 tablet by mouth daily.   Yes [provider]  RA KRILL OIL 500 MG CAPS Take 500 mg by mouth daily.   Yes [provider]  Turmeric 500 MG CAPS Take 500 mg by mouth daily.   Yes [provider]  Zinc 30 MG CAPS Take 30 mg by mouth daily.   Yes [provider]    Allergies    Patient has no known allergies.  Review of Systems   Review of Systems  Constitutional:  Negative for chills and fever.  HENT:  Negative for ear pain and sore throat.   Eyes:  Negative for pain and visual disturbance.  Respiratory:  Negative for cough and shortness of breath.   Cardiovascular:  Negative for chest pain and palpitations.  Gastrointestinal:  Negative for abdominal pain and vomiting.  Genitourinary:  Negative for dysuria and hematuria.  Musculoskeletal:  Negative for arthralgias and back pain.  Skin:  Negative for color change and rash.  Neurological:  Positive for weakness. Negative for seizures and syncope.       Agraphia  All other systems reviewed and are negative.  Physical Exam Updated Vital Signs BP (!) 142/80   Pulse (!) 48   Temp 97.6 F (36.4 C) (Oral)   Resp 20   Ht '5\' 2"'$  (1.575 m)   Wt 70.3 kg   SpO2 97%   BMI 28.35 kg/m   Physical Exam Vitals and nursing note reviewed.  Constitutional:      Appearance: He is well-developed.  HENT:     Head: Normocephalic and atraumatic.  Eyes:     Conjunctiva/sclera: Conjunctivae normal.  Cardiovascular:     Rate and Rhythm: Regular rhythm. Bradycardia present.     Heart sounds: No murmur heard. Pulmonary:      Effort: Pulmonary effort is normal. No respiratory distress.     Breath sounds: Normal breath sounds.  Abdominal:     Palpations: Abdomen is soft.     Tenderness: There is no abdominal tenderness.  Musculoskeletal:     Cervical back: Neck supple.  Skin:    General: Skin is warm and dry.  Neurological:     Mental Status: He is alert.    ED Results / Procedures / Treatments   Labs (all labs ordered are listed, but only abnormal results are displayed) Labs Reviewed  CBC - Abnormal; Notable for the following components:      Result Value   Platelets 98 (*)    All other components within normal limits  DIFFERENTIAL - Abnormal; Notable for the following components:   Lymphs Abs 0.5 (*)    All other components within normal limits  COMPREHENSIVE METABOLIC PANEL - Abnormal; Notable for the following components:   Glucose, Bld 138 (*)    Total Protein 5.8 (*)    All other components within normal limits  RAPID URINE DRUG SCREEN, HOSP PERFORMED - Abnormal; Notable for the following components:   Tetrahydrocannabinol POSITIVE (*)    All other components within normal limits  URINALYSIS, ROUTINE W REFLEX MICROSCOPIC - Abnormal; Notable for the following components:   Specific Gravity, Urine <1.005 (*)    All other components within normal limits  I-STAT CHEM 8, ED - Abnormal; Notable for the following components:   Glucose, Bld 139 (*)    Hemoglobin 12.6 (*)    HCT 37.0 (*)    All other components within normal limits  CBG MONITORING, ED - Abnormal; Notable for the following components:   Glucose-Capillary 135 (*)    All other components within normal limits  RESP PANEL BY RT-PCR (FLU A&B, COVID) [id number]  ETHANOL  PROTIME-INR  APTT    EKG None  Radiology CT HEAD WO CONTRAST  Result Date: 02/17/2021 CLINICAL DATA:  Neuro deficit, acute, stroke suspected; stroke symptoms. Additional history provided: Difficulty walking with facial droop to right eye, history of stroke 2  years ago. EXAM: CT HEAD WITHOUT CONTRAST TECHNIQUE: Contiguous axial images were obtained from the base of the skull through  the vertex without intravenous contrast. COMPARISON:  Brain MRI 11/29/2016 (images available, report unavailable). Head CT 02/23/2016 (images available, report unavailable). FINDINGS: Brain: Mild generalized cerebral atrophy. Chronic appearing cortical/subcortical infarct within the anterolateral left frontal lobe and anterior left insula measuring 3.0 x 3.0 x 2.3 cm (AP x TV x CC). This is new as compared to the brain MRI of 11/29/2016. There is no acute intracranial hemorrhage. No demarcated cortical infarct. No extra-axial fluid collection. No evidence of an intracranial mass. No midline shift. Vascular: Small foci of calcification within the left sylvian fissure and overlying the lateral left frontal lobe suspicious for age-indeterminate left middle cerebral artery calcified emboli. Additional punctate focus of calcification overlying the posterior left frontal lobe, unchanged as compared to the head CT of 02/23/2016. This may reflect an additional calcified embolus. Atherosclerotic calcification also present within the intracranial internal carotid arteries and vertebral arteries. Skull: Normal. Negative for fracture or focal lesion. Sinuses/Orbits: Visualized orbits show no acute finding. Trace bilateral ethmoid sinus mucosal thickening. Tiny right sphenoid sinus mucous retention cyst. IMPRESSION: 3 cm cortical/subcortical infarct within the anterolateral left frontal lobe and anterior left insula, new from the brain MRI of 11/29/2016, but chronic in appearance. Please note a brain MRI would have greater sensitivity for acute on chronic ischemia at this site. Small foci of calcification within the left sylvian fissure and overlying the lateral left frontal lobe suspicious for age-indeterminate calcified emboli within left middle cerebral artery branches. Mild generalized cerebral  atrophy. Minimal paranasal sinus disease at the imaged levels, as described. Electronically Signed   By: Kellie Simmering D.O.   On: 02/17/2021 12:03    Procedures Procedures   Medications Ordered in ED Medications  aspirin EC tablet 81 mg (has no administration in time range)  atorvastatin (LIPITOR) tablet 80 mg (has no administration in time range)  clopidogrel (PLAVIX) tablet 75 mg (has no administration in time range)  enoxaparin (LOVENOX) injection 40 mg (has no administration in time range)   stroke: mapping our early stages of recovery book (has no administration in time range)  0.9 %  sodium chloride infusion (has no administration in time range)  acetaminophen (TYLENOL) tablet 650 mg (has no administration in time range)    Or  acetaminophen (TYLENOL) 160 MG/5ML solution 650 mg (has no administration in time range)    Or  acetaminophen (TYLENOL) suppository 650 mg (has no administration in time range)  senna-docusate (Senokot-S) tablet 1 tablet (has no administration in time range)    ED Course  I have reviewed the triage vital signs and the nursing notes.  Pertinent labs & imaging results that were available during my care of the patient were reviewed by me and considered in my medical decision making (see chart for details).    MDM Rules/Calculators/A&P                           Patient seen in the emergency department for evaluation of suspected stroke symptoms.  Initial neurologic evaluation unremarkable with no focal motor or sensory deficits.  Laboratory evaluation largely unremarkable.  UDS positive for THC.  CT head with a 3 mm age-indeterminate lesion that appears to be new in the left frontal lobe as well as a possible calcified embolus in the left MCA distribution.  Patient is outside of the window for thrombectomy and tPA and a stroke alert was not called.  Neurology was consulted and recommended medicine admission for MRI.  Patient  then admitted. Final Clinical  Impression(s) / ED Diagnoses Final diagnoses:  None    Rx / DC Orders ED Discharge Orders     None        Lynesha Bango, Debe Coder, MD 02/17/21 [phone]

## 2021-02-17 NOTE — Consult Note (Signed)
NEURO HOSPITALIST CONSULT NOTE   Requestig physician: Dr. Matilde Sprang  Reason for Consult: New onset of dysgraphia, dysphasia and right sided weakness.  History obtained from:  Patient, wife, chart.   HPI:                                                                                                                                          Alan Mcdonald is an 70 y.o. male with a PMHx of stroke 2 years ago (amaurosis fugax-did not get admitted to hospital), NSTEMI with CABG x 2, Aortic valve replacement, Lymphoma, Parkinsonians in f/up,  who presented with new onset of stroke symptoms consisting of difficulty walking with "struggling to the right", "facial droop to right eye", trouble speaking, difficulty writing. He states that the stroke symptoms began yesterday. BP was 150/74 in Triage, with HR 55, 98% RA and CBG of 144.   Patient felt "off" yesterday, but couldn't really clarify that. He left the house, worked out, and felt OK, but just a little slow with thinking and walking. He got a flu shot yesterday and had difficulty completing the forms. He states he could read left to right, but in the middle of the page, the words/letters would not make sense. Today, his wife noticed a right eye droop and him dragging along his left leg. He could still walk, just not normally. After, she decided to call his cardiologist to ask for advice and MD told her to bring him to ED. Patient did not want the ambulance called and went to the local fire dept around the block and EMS took over from there. He feels like his symptoms have improved, but still feels like his thinking is slowed.   He sees his cardiologist frequently in f/up from CABG/NSTEMI last year. His LDL was 69 in June 2022. He is on Lipitor '80mg'$ . He takes Plavix and ASA since his surgery and was due to stop Plavix on 02/21/21.   CT brain reveals a left frontal operculum stroke of indeterminate age, appearing more likely to be subacute  than chronic.   Neurology was asked to consult for finding of stroke.   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   LIMA to LAD SVG to OM  Amaurosis fugax, NSTEMI, Parkinsonism  Past Surgical History:  Procedure Laterality Date   AORTIC VALVE REPLACEMENT N/A 02/22/2020   Procedure: AORTIC VALVE REPLACEMENT (AVR) USING EDWARDS Resilia 25 MM AORTIC VALVE.;  Surgeon: Darylene Price  H, MD;  Location: [facility];  Service: Open Heart Surgery;  Laterality: N/A;   CARDIAC CATHETERIZATION     CERVICAL SPINE SURGERY     CHOLECYSTECTOMY     CORONARY ARTERY BYPASS GRAFT N/A 02/22/2020   Procedure: CORONARY ARTERY BYPASS GRAFTING (CABG) USING LIMA to LAD; ENDSCOPICALLY HARVEST RIGHT GREATER SAPHENOUS VEIN: SVG to OM1;  Surgeon: Rexene Alberts, MD;  Location: [place];  Service: Open Heart Surgery;  Laterality: N/A;   ELBOW SURGERY     ENDOVEIN HARVEST OF GREATER SAPHENOUS VEIN Right 02/22/2020   Procedure: ENDOVEIN HARVEST OF GREATER SAPHENOUS VEIN;  Surgeon: Rexene Alberts, MD;  Location: [place];  Service: Open Heart Surgery;  Laterality: Right;   KIDNEY STONE SURGERY     LEFT HEART CATH AND CORONARY ANGIOGRAPHY N/A 02/22/2020   Procedure: LEFT HEART CATH AND CORONARY ANGIOGRAPHY;  Surgeon: Belva Crome, MD;  Location: [facility];  Service: Cardiovascular;  Laterality: N/A;   TEE WITHOUT CARDIOVERSION N/A 02/22/2020   Procedure: TRANSESOPHAGEAL ECHOCARDIOGRAM (TEE);  Surgeon: Rexene Alberts, MD;  Location: [facility];  Service: Open Heart Surgery;  Laterality: N/A;    FMHx: multiple deaths from CAD, MIs.   Social History:  reports that he quit smoking about 19 years ago. His smoking use included cigarettes. He has  never used smokeless tobacco. He reports current alcohol use. He reports that he does not use drugs.  No Known Allergies  MEDICATIONS:                                                                                                                     Prior to Admission: Lipitor '80mg'$  po qd, Carvidiol 3/'125mg'$  po bid, ASA '81mg'$  po qd, Plavix '75mg'$  po qd, CoQ10, B complex, Vitamin D3, Glucosamine/chondroitin, MVI, Cozaar '100mg'$  po qd, Krill oil, turmeric, Zinc.   ROS:                                                                                                                                       General ROS: negative for - chills, fatigue, fever, night sweats, weight gain or weight loss Psychological ROS: negative for - behavioral disorder, hallucinations, memory difficulties, mood swings or suicidal ideation Ophthalmic ROS: as per HPI. No eye pain or loss of vision.  ENT ROS: negative for - epistaxis, nasal discharge, oral lesions, sore throat, tinnitus or vertigo Allergy and Immunology ROS:  negative for - hives or itchy/watery eyes Hematological and Lymphatic ROS: negative for - bleeding problems, bruising or swollen lymph nodes Endocrine ROS: negative for - galactorrhea, hair pattern changes, polydipsia/polyuria or temperature intolerance Respiratory ROS: negative for - cough, hemoptysis, shortness of breath or wheezing Cardiovascular ROS: negative for - chest pain, dyspnea on exertion, edema or irregular heartbeat Gastrointestinal ROS: negative for - abdominal pain, diarrhea, hematemesis, nausea/vomiting or stool incontinence Genito-Urinary ROS: negative for - dysuria, hematuria, incontinence or urinary frequency/urgency Musculoskeletal ROS: negative for - joint swelling or muscular weakness Neurological ROS: as noted in HPI Dermatological ROS: negative for rash and skin lesion changes   Blood pressure 139/71, pulse (!) 50, temperature 97.6 F (36.4 C), temperature source Oral, resp. rate  19, height '5\' 2"'$  (1.575 m), weight 70.3 kg, SpO2 98 %.   General Examination:                                                                                                       Physical Exam  HEENT-  Normocephalic, no lesions, without obvious abnormality.  Normal external eye and conjunctiva.   Cardiovascular- S1-S2 audible Lungs-no rhonchi or wheezing noted, no excessive work of breathing.  Saturations within normal limits Abdomen- All 4 quadrants palpated and nontender Extremities- Warm, dry and intact Musculoskeletal-no joint tenderness, deformity or swelling Skin-warm and dry, no hyperpigmentation, vitiligo, or suspicious lesions  Neurological Examination Mental Status: Alert, oriented, thought content appropriate.  Speech fluent without evidence of aphasia or dysarthria.  Able to follow 3 step commands without difficulty. Cranial Nerves: II: PERRL. Visual fields grossly normal  III,IV, VI: ptosis not present, extra-ocular motions intact bilaterally  V,VII: smile symmetric, facial light touch sensation normal bilaterally. Able to puff cheeks and raise eyebrows.  VIII: hearing normal bilaterally IX,X: uvula rises symmetrically XI: bilateral shoulder shrug XII: midline tongue extension Motor: Right : Upper extremity   5/5        Left:   Upper extremity   5/5 Right:  Lower extremity   5/5        Left: Lower extremity   5/5 Tone and bulk:normal tone throughout; no atrophy noted Subtle positive orbiting fingers test on the right.  Subtle lag of shoulder shrug on the right relative to the left Sensory:  light touch intact throughout, bilaterally. No extinction to DSS.  Deep Tendon Reflexes:  1+ bilat patella.  2+ upper extremities.  Plantars: Right: downgoing   Left: downgoing Cerebellar: normal finger-to-nose, normal rapid alternating movements and normal heel-to-shin test Gait: Able to get OOB without assistance. Steady with standing. He states he has been OOB to bathroom  alone.    Lab Results: Basic Metabolic Panel: Recent Labs  Lab 02/17/21 1033 02/17/21 1046  NA 140 142  K 3.5 3.6  CL 107 107  CO2 23  --   GLUCOSE 138* 139*  BUN 12 10  CREATININE 0.72 0.70  CALCIUM 9.0  --     CBC: Recent Labs  Lab 02/17/21 1033 02/17/21 1046  WBC 6.3  --   NEUTROABS 5.0  --  HGB 13.7 12.6*  HCT 39.6 37.0*  MCV 93.0  --   PLT 98*  --     Lipid Panel: LDL 69 in 6/22.   Imaging: CT HEAD WO CONTRAST  Result Date: 02/17/2021 CLINICAL DATA:  Neuro deficit, acute, stroke suspected; stroke symptoms. Additional history provided: Difficulty walking with facial droop to right eye, history of stroke 2 years ago. EXAM: CT HEAD WITHOUT CONTRAST TECHNIQUE: Contiguous axial images were obtained from the base of the skull through the vertex without intravenous contrast. COMPARISON:  Brain MRI 11/29/2016 (images available, report unavailable). Head CT 02/23/2016 (images available, report unavailable). FINDINGS: Brain: Mild generalized cerebral atrophy. Chronic appearing cortical/subcortical infarct within the anterolateral left frontal lobe and anterior left insula measuring 3.0 x 3.0 x 2.3 cm (AP x TV x CC). This is new as compared to the brain MRI of 11/29/2016. There is no acute intracranial hemorrhage. No demarcated cortical infarct. No extra-axial fluid collection. No evidence of an intracranial mass. No midline shift. Vascular: Small foci of calcification within the left sylvian fissure and overlying the lateral left frontal lobe suspicious for age-indeterminate left middle cerebral artery calcified emboli. Additional punctate focus of calcification overlying the posterior left frontal lobe, unchanged as compared to the head CT of 02/23/2016. This may reflect an additional calcified embolus. Atherosclerotic calcification also present within the intracranial internal carotid arteries and vertebral arteries. Skull: Normal. Negative for fracture or focal lesion.  Sinuses/Orbits: Visualized orbits show no acute finding. Trace bilateral ethmoid sinus mucosal thickening. Tiny right sphenoid sinus mucous retention cyst. IMPRESSION: 3 cm cortical/subcortical infarct within the anterolateral left frontal lobe and anterior left insula, new from the brain MRI of 11/29/2016, but chronic in appearance. Please note a brain MRI would have greater sensitivity for acute on chronic ischemia at this site. Small foci of calcification within the left sylvian fissure and overlying the lateral left frontal lobe suspicious for age-indeterminate calcified emboli within left middle cerebral artery branches. Mild generalized cerebral atrophy. Minimal paranasal sinus disease at the imaged levels, as described. Electronically Signed   By: Kellie Simmering D.O.   On: 02/17/2021 12:03     Assessment: 70 year old male with a PMHx of amaurosis fugax 2 years ago and MI last year with CABG, presenting with new onset of stroke symptoms consisting of difficulty walking with "struggling to the right", "facial droop to right eye", trouble speaking, difficulty writing. He states that the stroke symptoms began yesterday. His stroke risk factors are HLD, HTN, and CAD. -CTH with age undetermined subacute left frontal operculum infarct. Appears most likely to be subacute. -Stroke is completed, so not a candidate for IR procedure.  -His presenting symptoms have resolved on exam except for not witnessing gait.     Recommendations: -Medicine admit.  -Permissive HTN up to 220/110 x 24 hours, then normalize.  -MRI/MRA head. -Vascular US carotids.  -echocardiogram.  -NIHSS per stroke protocol.  -Frequent neurology checks/  -Stroke risk factor reduction.  -Check HbA1c with goal < 7.  -No need to check LDL, it was 69 on Lipitor '80mg'$  po qd in June.  -Stroke education.  -cardiac telemetry to monitor for arrhythmia.  -Continue home DAPT with ASA '81mg'$  po qd plus Plavix '75mg'$  po qd  -Do not stop Plavix on the  13th as previously instructed by cardiologist. Given stroke on DAPT, being on only ASA likely would increase the likelihood of stroke recurrence relative to ASA/Plavix combination.     Electronically signed: Dr. Kerney Elbe 02/17/2021, 12:34  PM

## 2021-02-17 NOTE — Telephone Encounter (Signed)
Pt seems to think he may have had a stroke yesterday. His speech and handwriting was off, he had problems completing a form yesterday. He is better today but he wants to know what he should do. Pt had a flu shot yesterday. Pt states he felt like he was on "speed" and his brain was going really fast. Pt states he had a stroke in his eye about 3 years ago.  Pt's BP is 150/60 pulse 53

## 2021-02-18 ENCOUNTER — Observation Stay (HOSPITAL_BASED_OUTPATIENT_CLINIC_OR_DEPARTMENT_OTHER): Payer: Medicare Other

## 2021-02-18 ENCOUNTER — Encounter (HOSPITAL_COMMUNITY): Payer: Self-pay | Admitting: Internal Medicine

## 2021-02-18 DIAGNOSIS — I6389 Other cerebral infarction: Secondary | ICD-10-CM | POA: Diagnosis not present

## 2021-02-18 DIAGNOSIS — I6330 Cerebral infarction due to thrombosis of unspecified cerebral artery: Secondary | ICD-10-CM | POA: Insufficient documentation

## 2021-02-18 DIAGNOSIS — R2990 Unspecified symptoms and signs involving the nervous system: Secondary | ICD-10-CM | POA: Diagnosis not present

## 2021-02-18 DIAGNOSIS — I639 Cerebral infarction, unspecified: Secondary | ICD-10-CM | POA: Diagnosis not present

## 2021-02-18 LAB — ECHOCARDIOGRAM COMPLETE
AR max vel: 3.03 cm2
AV Area VTI: 3.63 cm2
AV Area mean vel: 3.65 cm2
AV Mean grad: 3.6 mmHg
AV Peak grad: 6.8 mmHg
Ao pk vel: 1.31 m/s
Area-P 1/2: 3.28 cm2
Height: 62 in
S' Lateral: 2.75 cm
Single Plane A4C EF: 64.1 %
Weight: 2480 oz

## 2021-02-18 LAB — HEMOGLOBIN A1C
Hgb A1c MFr Bld: 5.1 % (ref 4.8–5.6)
Mean Plasma Glucose: 99.67 mg/dL

## 2021-02-18 MED ORDER — ASPIRIN EC 81 MG PO TBEC: 162.0000 mg | DELAYED_RELEASE_TABLET | Freq: Every day | ORAL | Status: AC

## 2021-02-18 MED ORDER — ASPIRIN EC 81 MG PO TBEC: 162.0000 mg | DELAYED_RELEASE_TABLET | Freq: Every day | ORAL | 11 refills | Status: DC

## 2021-02-18 NOTE — Progress Notes (Signed)
[facility] Renny Gunnarson A. Merlene Laughter, MD     [email or web address]          Richards Snipe is an 70 y.o. male.   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  Imaging results are reviewed in detail and discussed with the patient.  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  The patient should continue with dual antiplatelet agents and statin.  Aspirin will be increased 162 mg.  Dual antiplatelet agents are recommended for another 3 months.     Objective: Vital signs in last 24 hours: Temp:  [97.7 F (36.5 C)-97.9 F (36.6 C)] 97.9 F (36.6 C) (09/10 1057) Pulse Rate:  [48-59] 52 (09/10 1057) Resp:  [11-20] 14 (09/10 1057) BP: (110-175)/(59-92) 134/83 (09/10 1057) SpO2:  [96 %-100 %] 98 % (09/10 1057)    GENERAL: He is in a well at this time.  HEENT: Normal  ABDOMEN: soft  EXTREMITIES: No edema   BACK: Normal  SKIN: Normal by inspection.    MENTAL STATUS: Alert and oriented. Speech, language and cognition are generally intact. Judgment and insight normal.   CRANIAL NERVES: Pupils are equal, round and reactive to light and accomodation; extra ocular movements are full, there is no significant nystagmus; visual fields are full; upper and lower facial muscles are normal in strength and symmetric, there is no flattening of the nasolabial folds; tongue is midline; uvula is midline; shoulder elevation is normal.  MOTOR: Normal tone, bulk and strength; no pronator drift.  COORDINATION: Left finger to nose is normal, right finger to nose is normal, No rest tremor; no intention tremor; no postural tremor; no bradykinesia.  SENSATION: Normal to light touch, temperature, and pain.      Intake/Output from previous day: No intake/output data recorded. Intake/Output this  shift: No intake/output data recorded. Nutritional status:  Diet Order             Diet Heart Room service appropriate? Yes; Fluid consistency: Thin  Diet effective ____                    Lab Results: Results for orders placed or performed during the hospital encounter of 02/17/21 (from the past 48 hour(s))  Resp Panel by RT-PCR (Flu A&B, Covid) Nasopharyngeal Swab     Status: None   Collection Time: 02/17/21 10:30 AM   Specimen: Nasopharyngeal Swab; Nasopharyngeal(NP) swabs in vial transport medium  Result Value Ref Range   SARS Coronavirus 2 by RT PCR NEGATIVE NEGATIVE    Comment: (NOTE) SARS-CoV-2 target nucleic acids are NOT DETECTED.  The SARS-CoV-2 RNA is generally detectable in upper respiratory specimens during the acute phase of infection. The lowest concentration of SARS-CoV-2 viral copies this assay can detect is 138 copies/mL. A negative result does not preclude SARS-Cov-2 infection and should not be used as the sole basis for treatment or other patient management decisions. A negative result may occur with  improper specimen collection/handling, submission of specimen other than nasopharyngeal swab, presence of viral mutation(s) within the areas targeted by this assay, and inadequate number of viral copies(<138 copies/mL). A negative result must be combined with clinical observations, patient history, and epidemiological information. The expected result is Negative.  Fact Sheet for Patients:  EntrepreneurPulse.com.au  Fact Sheet for Healthcare Providers:  IncredibleEmployment.be  This test is no t yet approved or  cleared by the [place] and  has been authorized for detection and/or diagnosis of SARS-CoV-2 by FDA under an Emergency Use Authorization (EUA). This EUA will remain  in effect (meaning this test can be used) for the duration of the COVID-19 declaration under Section 564(b)(1) of the Act,  21 U.S.C.section 360bbb-3(b)(1), unless the authorization is terminated  or revoked sooner.       Influenza A by PCR NEGATIVE NEGATIVE   Influenza B by PCR NEGATIVE NEGATIVE    Comment: (NOTE) The Xpert Xpress SARS-CoV-2/FLU/RSV plus assay is intended as an aid in the diagnosis of influenza from Nasopharyngeal swab specimens and should not be used as a sole basis for treatment. Nasal washings and aspirates are unacceptable for Xpert Xpress SARS-CoV-2/FLU/RSV testing.  Fact Sheet for Patients: EntrepreneurPulse.com.au  Fact Sheet for Healthcare Providers: IncredibleEmployment.be  This test is not yet approved or cleared by the [place] FDA and has been authorized for detection and/or diagnosis of SARS-CoV-2 by FDA under an Emergency Use Authorization (EUA). This EUA will remain in effect (meaning this test can be used) for the duration of the COVID-19 declaration under Section 564(b)(1) of the Act, 21 U.S.C. section 360bbb-3(b)(1), unless the authorization is terminated or revoked.  Performed at [facility], [place] [address] [place], [place] [address]   Ethanol     Status: None   Collection Time: 02/17/21 10:33 AM  Result Value Ref Range   Alcohol, Ethyl (B) <10 <10 mg/dL    Comment: (NOTE) Lowest detectable limit for serum alcohol is 10 mg/dL.  For medical purposes only. Performed at [facility], [place] [address], [place], [place] [address]   Protime-INR     Status: None   Collection Time: 02/17/21 10:33 AM  Result Value Ref Range   Prothrombin Time 14.3 11.4 - 15.2 seconds   INR 1.1 0.8 - 1.2    Comment: (NOTE) INR goal varies based on device and disease states. Performed at [facility], [place] [address], [place], [place] [address]   APTT     Status: None   Collection Time: 02/17/21 10:33 AM  Result Value Ref Range   aPTT 32 24 - 36 seconds    Comment: Performed at [facility] [address] [place], [place] [address]  CBC     Status: Abnormal   Collection Time: 02/17/21 10:33 AM  Result Value Ref Range   WBC 6.3 4.0 - 10.5 K/uL   RBC 4.26 4.22 - 5.81 MIL/uL   Hemoglobin 13.7 13.0 - 17.0 g/dL   HCT 39.6 39.0 - 52.0 %   MCV 93.0 80.0 - 100.0 fL   MCH 32.2 26.0 - 34.0 pg   MCHC 34.6 30.0 - 36.0 g/dL   RDW 13.1 11.5 - 15.5 %   Platelets 98 (L) 150 - 400 K/uL    Comment: Immature Platelet Fraction may be clinically indicated, consider ordering this additional test [id number] REPEATED TO VERIFY PLATELET COUNT CONFIRMED BY SMEAR    nRBC 0.0 0.0 - 0.2 %    Comment: Performed at [facility], [place] [address], [place],  [address]  Differential     Status: Abnormal   Collection Time: 02/17/21 10:33 AM  Result Value Ref Range   Neutrophils Relative % 78 %   Neutro Abs 5.0 1.7 - 7.7 K/uL   Lymphocytes Relative 8 %   Lymphs Abs 0.5 (L) 0.7 - 4.0 K/uL   Monocytes Relative 9 %  Monocytes Absolute 0.6 0.1 - 1.0 K/uL   Eosinophils Relative 4 %   Eosinophils Absolute 0.3 0.0 - 0.5 K/uL   Basophils Relative 0 %   Basophils Absolute 0.0 0.0 - 0.1 K/uL   Immature Granulocytes 1 %   Abs Immature Granulocytes 0.03 0.00 - 0.07 K/uL    Comment: Performed at [facility], [place] [address], [place], [place] [address]  Comprehensive metabolic panel     Status: Abnormal   Collection Time: 02/17/21 10:33 AM  Result Value Ref Range   Sodium 140 135 - 145 mmol/L   Potassium 3.5 3.5 - 5.1 mmol/L   Chloride 107 98 - 111 mmol/L   CO2 23 22 - 32 mmol/L   Glucose, Bld 138 (H) 70 - 99 mg/dL    Comment: Glucose reference range applies only to samples taken after fasting for at least 8 hours.   BUN 12 8 - 23 mg/dL   Creatinine, Ser 0.72 0.61 - 1.24 mg/dL   Calcium 9.0 8.9 - 10.3 mg/dL   Total Protein 5.8 (L) 6.5 - 8.1 g/dL   Albumin 3.8 3.5 - 5.0 g/dL   AST 22 15 - 41 U/L   ALT 18 0 - 44 U/L   Alkaline Phosphatase 78 38 - 126 U/L   Total Bilirubin 1.1 0.3 - 1.2 mg/dL   GFR,  Estimated >60 >60 mL/min    Comment: (NOTE) Calculated using the CKD-EPI Creatinine Equation (2021)    Anion gap 10 5 - 15    Comment: Performed at [facility] [address] [place], [place] [address]  Urine rapid drug screen (hosp performed)     Status: Abnormal   Collection Time: 02/17/21 10:34 AM  Result Value Ref Range   Opiates NONE DETECTED NONE DETECTED   Cocaine NONE DETECTED NONE DETECTED   Benzodiazepines NONE DETECTED NONE DETECTED   Amphetamines NONE DETECTED NONE DETECTED   Tetrahydrocannabinol POSITIVE (A) NONE DETECTED   Barbiturates NONE DETECTED NONE DETECTED    Comment: (NOTE) DRUG SCREEN FOR MEDICAL PURPOSES ONLY.  IF CONFIRMATION IS NEEDED FOR ANY PURPOSE, NOTIFY LAB WITHIN 5 DAYS.  LOWEST DETECTABLE LIMITS FOR URINE DRUG SCREEN Drug Class                     Cutoff (ng/mL) Amphetamine and metabolites    1000 Barbiturate and metabolites    200 Benzodiazepine                 [id number] Tricyclics and metabolites     300 Opiates and metabolites        300 Cocaine and metabolites        300 THC                            50 Performed at [facility], [place] [address] [place], [place] [address]   Urinalysis, Routine w reflex microscopic Urine, Clean Catch     Status: Abnormal   Collection Time: 02/17/21 10:34 AM  Result Value Ref Range   Color, Urine YELLOW YELLOW   APPearance CLEAR CLEAR   Specific Gravity, Urine <1.005 (L) 1.005 - 1.030   pH 6.0 5.0 - 8.0   Glucose, UA NEGATIVE NEGATIVE mg/dL   Hgb urine dipstick NEGATIVE NEGATIVE   Bilirubin Urine NEGATIVE NEGATIVE   Ketones, ur NEGATIVE NEGATIVE mg/dL   Protein, ur NEGATIVE NEGATIVE mg/dL   Nitrite NEGATIVE NEGATIVE  Leukocytes,Ua NEGATIVE NEGATIVE    Comment: Microscopic not done on urines with negative protein, blood, leukocytes, nitrite, or glucose < 500 mg/dL. Performed at [facility], [place] [address] [place], [place] [address]   CBG monitoring, ED     Status: Abnormal    Collection Time: 02/17/21 10:34 AM  Result Value Ref Range   Glucose-Capillary 135 (H) 70 - 99 mg/dL    Comment: Glucose reference range applies only to samples taken after fasting for at least 8 hours.  I-stat chem 8, ED     Status: Abnormal   Collection Time: 02/17/21 10:46 AM  Result Value Ref Range   Sodium 142 135 - 145 mmol/L   Potassium 3.6 3.5 - 5.1 mmol/L   Chloride 107 98 - 111 mmol/L   BUN 10 8 - 23 mg/dL   Creatinine, Ser 0.70 0.61 - 1.24 mg/dL   Glucose, Bld 139 (H) 70 - 99 mg/dL    Comment: Glucose reference range applies only to samples taken after fasting for at least 8 hours.   Calcium, Ion 1.19 1.15 - 1.40 mmol/L   TCO2 26 22 - 32 mmol/L   Hemoglobin 12.6 (L) 13.0 - 17.0 g/dL   HCT 37.0 (L) 39.0 - 52.0 %  Hemoglobin A1c     Status: None   Collection Time: 02/18/21  5:00 AM  Result Value Ref Range   Hgb A1c MFr Bld 5.1 4.8 - 5.6 %    Comment: (NOTE) Pre diabetes:          5.7%-6.4%  Diabetes:              >6.4%  Glycemic control for   <7.0% adults with diabetes    Mean Plasma Glucose 99.67 mg/dL    Comment: Performed at [facility] [address], [place],  [address]    Lipid Panel No results for input(s): CHOL, TRIG, HDL, CHOLHDL, VLDL, LDLCALC in the last 72 hours.  Studies/Results:   TTE  1. Left ventricular ejection fraction, by estimation, is 60 to 65%. The  left ventricle has normal function. The left ventricle has no regional  wall motion abnormalities. There is mild left ventricular hypertrophy.  Left ventricular diastolic parameters  are indeterminate.   2. Right ventricular systolic function is normal. The right ventricular  size is normal. Tricuspid regurgitation signal is inadequate for assessing  PA pressure.   3. The mitral valve is normal in structure. No evidence of mitral valve  regurgitation. No evidence of mitral stenosis.   4. The aortic valve has been repaired/replaced. Aortic valve  regurgitation is not  visualized. No aortic stenosis is present. There is a  bioprosthetic valve present in the aortic position.   5. The inferior vena cava is normal in size with greater than 50%  respiratory variability, suggesting right atrial pressure of 3 mmHg.    BRAIN MRI MRA IMPRESSION: MRI HEAD IMPRESSION:   1. 2 cm acute ischemic nonhemorrhagic perforator type infarct involving the left lentiform and caudate nuclei. 2. Additional punctate acute ischemic nonhemorrhagic infarct inferiorly adjacent to the anterior commissure. 3. Chronic left MCA distribution infarct, with additional chronic hemorrhagic lacunar infarct involving the right frontal corona radiata. 4. Underlying mild chronic microvascular ischemic disease. 5. 4 mm nodular focus within the left IAC, which could reflect a small intracanalicular lesion/mass, possibly a schwannoma. Nonemergent follow-up examination with dedicated IAC protocol MRI of the brain suggested for further evaluation.   MRA HEAD IMPRESSION:   1. Negative  intracranial MRA for large vessel occlusion. 2. Single short-segment severe distal left M3 stenosis. 3. Otherwise negative intracranial MRA. No other proximal high-grade or correctable stenosis identified.         Medications:  Scheduled Meds:  aspirin EC  81 mg Oral Daily   atorvastatin  80 mg Oral QPM   clopidogrel  75 mg Oral Daily   enoxaparin (LOVENOX) injection  40 mg Subcutaneous Q24H   Continuous Infusions:  sodium chloride 50 mL/hr at 02/18/21 0644   PRN Meds:.acetaminophen **OR** acetaminophen (TYLENOL) oral liquid 160 mg/5 mL **OR** acetaminophen, senna-docusate     LOS: 0 days   Timofey Carandang A. Merlene Laughter, M.D.  Diplomate, Tax adviser of Psychiatry and Neurology ( Neurology).

## 2021-02-18 NOTE — Evaluation (Signed)
Occupational Therapy Evaluation Patient Details Name: Alan Mcdonald MRN: [id number] DOB: 1950-10-19 Today's Date: 02/18/2021    History of Present Illness The pt is a 70 yo male presenting with stoke sx of difficulty walking with "struggling to the right", "facial droop to right eye", trouble speaking, difficulty writing, as well as "cloudy" thought process (Patient states he could see the letters/numbers, they just did not make sense). CT revealed subacute L frontal CVA, MRI revealed 2 cm acute ischemic infarct of L lentiform and caudate nuclei. PMH includes: CAD, HTN, s/p aortic valve replacement, s/p CABG x2, NSTEMI, and Parkinsonism.   Clinical Impression   Patient admitted for the diagnosis above. PTA he lives with his spouse, and needs no assist with any aspect of his life.  He remains active.  Patient was able to complete the clock drawing test with no errors noted, write out and add two number correctly, and was able to write his name in cursive.  He and his spouse believe he is back to his baseline, and he is hoping to return home.  No OT needs identified in the acute setting.  No post acute OT recommendations.  OT again reinforced CVA symptoms.  Patient and spouse verbalize understanding.      Follow Up Recommendations  No OT follow up    Equipment Recommendations  None recommended by OT    Recommendations for Other Services       Precautions / Restrictions Precautions Precautions: None Restrictions Weight Bearing Restrictions: No      Mobility Bed Mobility Overal bed mobility: Independent                  Transfers Overall transfer level: Independent Equipment used: None             General transfer comment: no assist, no evidence of instability    Balance Overall balance assessment: No apparent balance deficits (not formally assessed)                               Standardized Balance Assessment Standardized Balance Assessment : Dynamic  Gait Index   Dynamic Gait Index Level Surface: Normal Change in Gait Speed: Normal Gait with Horizontal Head Turns: Normal Gait with Vertical Head Turns: Normal Gait and Pivot Turn: Normal Step Over Obstacle: Mild Impairment Step Around Obstacles: Normal Steps: Normal Total Score: 23     ADL either performed or assessed with clinical judgement   ADL Overall ADL's : At baseline                                             Vision Baseline Vision/History: 1 Wears glasses Patient Visual Report: No change from baseline       Perception     Praxis      Pertinent Vitals/Pain Pain Assessment: No/denies pain     Hand Dominance Right   Extremity/Trunk Assessment Upper Extremity Assessment Upper Extremity Assessment: Overall WFL for tasks assessed   Lower Extremity Assessment Lower Extremity Assessment: Defer to PT evaluation   Cervical / Trunk Assessment Cervical / Trunk Assessment: Normal   Communication Communication Communication: No difficulties   Cognition Arousal/Alertness: Awake/alert Behavior During Therapy: WFL for tasks assessed/performed Overall Cognitive Status: Within Functional Limits for tasks assessed  General Comments: patient states he feels at his baseline.   General Comments       Exercises     Shoulder Instructions      Home Living Family/patient expects to be discharged to:: Private residence Living Arrangements: Spouse/significant other Available Help at Discharge: Family;Available 24 hours/day Type of Home: House Home Access: Stairs to enter [employer] of Steps: 2 Entrance Stairs-Rails: Right;Left Home Layout: Two level;Able to live on main level with bedroom/bathroom Alternate Level Stairs-Number of Steps: full flight   Bathroom Shower/Tub: Occupational psychologist: Standard     Home Equipment: Environmental consultant - 2 wheels          Prior  Functioning/Environment Level of Independence: Independent        Comments: No assist with mobility, self care.  Very active, continues to drive.        OT Problem List: Decreased cognition      OT Treatment/Interventions:      OT Goals(Current goals can be found in the care plan section) Acute Rehab OT Goals Patient Stated Goal: return home, return to workout activities OT Goal Formulation: With patient Time For Goal Achievement: 02/18/21 Potential to Achieve Goals: Good  OT Frequency:     Barriers to D/C:  None noted          Co-evaluation              AM-PAC OT "6 Clicks" Daily Activity     Outcome Measure Help from another person eating meals?: None Help from another person taking care of personal grooming?: None Help from another person toileting, which includes using toliet, bedpan, or urinal?: None Help from another person bathing (including washing, rinsing, drying)?: None Help from another person to put on and taking off regular upper body clothing?: None Help from another person to put on and taking off regular lower body clothing?: None 6 Click Score: 24   End of Session Nurse Communication: Mobility status  Activity Tolerance: Patient tolerated treatment well Patient left: in bed;with call bell/phone within reach;with family/visitor present  OT Visit Diagnosis: Other symptoms and signs involving cognitive function                Time: [id number] OT Time Calculation (min): 16 min Charges:  OT General Charges $OT Visit: 1 Visit OT Evaluation $OT Eval Moderate Complexity: 1 Mod  02/18/2021  RP, OTR/L  Acute Rehabilitation Services  Office:  [phone]   Metta Clines 02/18/2021, 2:50 PM

## 2021-02-18 NOTE — Progress Notes (Signed)
Pt. Discharged home with wife via car. AVS provided to pt. All questions answered.

## 2021-02-18 NOTE — Discharge Summary (Signed)
Physician Discharge Summary  Alan Mcdonald [id number] DOB: 19-Dec-1950 DOA: 02/17/2021  PCP: [facility]  Admit date: 02/17/2021 Discharge date: 02/18/2021  Discharge disposition: Home   Recommendations for Outpatient Follow-Up:   Follow-up other stroke clinic in 1 month. Follow-up with PCP in 1 week   Discharge Diagnosis:   Principal Problem:   Stroke-like symptoms Active Problems:   Coronary artery disease   Essential (primary) hypertension   Hyperlipidemia   Cerebral thrombosis with cerebral infarction    Discharge Condition: Stable.  Diet recommendation:  Diet Order             Diet - low sodium heart healthy           Diet Heart Room service appropriate? Yes; Fluid consistency: Thin  Diet effective ____                     Code Status: Full Code     Hospital Course:   [name] is a 70 year old man with medical history significant for hypertension, hyperlipidemia, left acoustic neuroma, CAD s/p CABG and aortic valve replacement, who presented to the hospital with strokelike symptoms.  He complained of confusion, feeling cloudy, slurred speech, difficulty writing, reading and understanding.  He was found to have acute ischemic stroke involving the left lentiform and caudate nuclei and punctate acute ischemic stroke inferiorly adjacent to the anterior commissure.  He was evaluated by the Dr. Merlene Laughter, neurologist, who recommended dual antiplatelet therapy with aspirin 162 mg daily and Plavix 75 mg daily for 3 months.  He recommended that patient discontinue aspirin after 3 months.  Case was discussed with Dr. Merlene Laughter, neurologist, and from his standpoint, patient is okay for discharge.  He did well with PT and OT.  Patient is feeling better and he is deemed stable for discharge to home today.  Discharge plan was discussed with the patient and he verbalized understanding.    Medical Consultants:   Neurologist   Discharge Exam:     Vitals:   02/18/21 0500 02/18/21 0715 02/18/21 0956 02/18/21 1057  BP:  (!) 155/73 (!) 155/92 134/83  Pulse: (!) 51 (!) 49 (!) 54 (!) 52  Resp: '17 20 19 14  '$ Temp:   97.7 F (36.5 C) 97.9 F (36.6 C)  TempSrc:   Oral Oral  SpO2: 96% 98% 97% 98%  Weight:      Height:         GEN: NAD SKIN: Warm and dry EYES: EOMI ENT: MMM CV: RRR PULM: CTA B ABD: soft, ND, NT, +BS CNS: AAO x 3, non focal EXT: No edema or tenderness     The results of significant diagnostics from this hospitalization (including imaging, microbiology, ancillary and laboratory) are listed below for reference.     Procedures and Diagnostic Studies:   CT HEAD WO CONTRAST  Result Date: 02/17/2021 CLINICAL DATA:  Neuro deficit, acute, stroke suspected; stroke symptoms. Additional history provided: Difficulty walking with facial droop to right eye, history of stroke 2 years ago. EXAM: CT HEAD WITHOUT CONTRAST TECHNIQUE: Contiguous axial images were obtained from the base of the skull through the vertex without intravenous contrast. COMPARISON:  Brain MRI 11/29/2016 (images available, report unavailable). Head CT 02/23/2016 (images available, report unavailable). FINDINGS: Brain: Mild generalized cerebral atrophy. Chronic appearing cortical/subcortical infarct within the anterolateral left frontal lobe and anterior left insula measuring 3.0 x 3.0 x 2.3 cm (AP x TV x CC). This is new as compared to the brain MRI  of 11/29/2016. There is no acute intracranial hemorrhage. No demarcated cortical infarct. No extra-axial fluid collection. No evidence of an intracranial mass. No midline shift. Vascular: Small foci of calcification within the left sylvian fissure and overlying the lateral left frontal lobe suspicious for age-indeterminate left middle cerebral artery calcified emboli. Additional punctate focus of calcification overlying the posterior left frontal lobe, unchanged as compared to the head CT of 02/23/2016. This may  reflect an additional calcified embolus. Atherosclerotic calcification also present within the intracranial internal carotid arteries and vertebral arteries. Skull: Normal. Negative for fracture or focal lesion. Sinuses/Orbits: Visualized orbits show no acute finding. Trace bilateral ethmoid sinus mucosal thickening. Tiny right sphenoid sinus mucous retention cyst. IMPRESSION: 3 cm cortical/subcortical infarct within the anterolateral left frontal lobe and anterior left insula, new from the brain MRI of 11/29/2016, but chronic in appearance. Please note a brain MRI would have greater sensitivity for acute on chronic ischemia at this site. Small foci of calcification within the left sylvian fissure and overlying the lateral left frontal lobe suspicious for age-indeterminate calcified emboli within left middle cerebral artery branches. Mild generalized cerebral atrophy. Minimal paranasal sinus disease at the imaged levels, as described. Electronically Signed   By: Kellie Simmering D.O.   On: 02/17/2021 12:03   MR ANGIO HEAD WO CONTRAST  Result Date: 02/17/2021 CLINICAL DATA:  Follow-up examination for acute stroke. EXAM: MRI HEAD WITHOUT CONTRAST MRA HEAD WITHOUT CONTRAST TECHNIQUE: Multiplanar, multi-echo pulse sequences of the brain and surrounding structures were acquired without intravenous contrast. Angiographic images of the Circle of Willis were acquired using MRA technique without intravenous contrast. COMPARISON:  CT from earlier the same day. FINDINGS: MRI HEAD FINDINGS Brain: Cerebral volume within normal limits for age. Patchy T2/FLAIR hyperintensity involving the periventricular and deep white matter both cerebral hemispheres most consistent with chronic small vessel ischemic disease. Focus of encephalomalacia and gliosis involving the anterior left frontal operculum consistent with a chronic left MCA distribution infarct. Associated chronic hemosiderin staining present at this location. Additional chronic  hemorrhagic lacunar infarct present at the right frontal corona radiata (series 7, image 22). 2 cm focus of curvilinear restricted diffusion involving the left lentiform and caudate nuclei, consistent with an acute perforator type infarct (series 3, image 30). Additional punctate focus of acute ischemia noted inferiorly adjacent to the anterior commissure (series 3, image 25). No associated hemorrhage or mass effect. No other diffusion abnormality to suggest acute or subacute ischemia. Gray-white matter differentiation otherwise maintained. No other evidence for acute intracranial hemorrhage. Few additional punctate chronic micro hemorrhages noted, likely hypertensive/small vessel related. No mass lesion, midline shift or mass effect. No hydrocephalus or extra-axial fluid collection. Pituitary gland suprasellar region within normal limits. Midline structures intact. Vascular: Major intracranial vascular flow voids are maintained. Skull and upper cervical spine: Craniocervical junction within normal limits. Bone marrow signal intensity within normal limits. No scalp soft tissue abnormality. Sinuses/Orbits: Globes and orbital soft tissues within normal limits. Mild scattered mucosal thickening noted within the ethmoidal air cells. Paranasal sinuses are otherwise clear. No significant mastoid effusion. Other: Subtle 4 mm nodular focus seen within the left IAC, which could reflect a small intracanalicular lesion/mass, possibly a schwannoma (series 6, image 7). MRA HEAD FINDINGS Anterior circulation: Both internal carotid arteries widely patent to the termini without stenosis. A1 segments widely patent. Normal anterior communicating artery complex. Both anterior cerebral arteries widely patent to their distal aspects without stenosis. No M1 stenosis or occlusion. Normal MCA bifurcations. Short-segment severe distal left M3  stenosis noted (series 255, image 7). Distal MCA branches otherwise well perfused and symmetric.  Posterior circulation: Vertebral arteries patent to the vertebrobasilar junction without stenosis. Right vertebral artery dominant. Both PICA origins patent and normal. Basilar widely patent to its distal aspect without stenosis. Superior cerebral arteries patent bilaterally. Both PCAs primarily supplied via the basilar and are well perfused to their distal aspects. Anatomic variants: None significant.  No aneurysm. IMPRESSION: MRI HEAD IMPRESSION: 1. 2 cm acute ischemic nonhemorrhagic perforator type infarct involving the left lentiform and caudate nuclei. 2. Additional punctate acute ischemic nonhemorrhagic infarct inferiorly adjacent to the anterior commissure. 3. Chronic left MCA distribution infarct, with additional chronic hemorrhagic lacunar infarct involving the right frontal corona radiata. 4. Underlying mild chronic microvascular ischemic disease. 5. 4 mm nodular focus within the left IAC, which could reflect a small intracanalicular lesion/mass, possibly a schwannoma. Nonemergent follow-up examination with dedicated IAC protocol MRI of the brain suggested for further evaluation. MRA HEAD IMPRESSION: 1. Negative intracranial MRA for large vessel occlusion. 2. Single short-segment severe distal left M3 stenosis. 3. Otherwise negative intracranial MRA. No other proximal high-grade or correctable stenosis identified. Electronically Signed   By: Jeannine Boga M.D.   On: 02/17/2021 19:43   MR BRAIN WO CONTRAST  Result Date: 02/17/2021 CLINICAL DATA:  Follow-up examination for acute stroke. EXAM: MRI HEAD WITHOUT CONTRAST MRA HEAD WITHOUT CONTRAST TECHNIQUE: Multiplanar, multi-echo pulse sequences of the brain and surrounding structures were acquired without intravenous contrast. Angiographic images of the Circle of Willis were acquired using MRA technique without intravenous contrast. COMPARISON:  CT from earlier the same day. FINDINGS: MRI HEAD FINDINGS Brain: Cerebral volume within normal limits for  age. Patchy T2/FLAIR hyperintensity involving the periventricular and deep white matter both cerebral hemispheres most consistent with chronic small vessel ischemic disease. Focus of encephalomalacia and gliosis involving the anterior left frontal operculum consistent with a chronic left MCA distribution infarct. Associated chronic hemosiderin staining present at this location. Additional chronic hemorrhagic lacunar infarct present at the right frontal corona radiata (series 7, image 22). 2 cm focus of curvilinear restricted diffusion involving the left lentiform and caudate nuclei, consistent with an acute perforator type infarct (series 3, image 30). Additional punctate focus of acute ischemia noted inferiorly adjacent to the anterior commissure (series 3, image 25). No associated hemorrhage or mass effect. No other diffusion abnormality to suggest acute or subacute ischemia. Gray-white matter differentiation otherwise maintained. No other evidence for acute intracranial hemorrhage. Few additional punctate chronic micro hemorrhages noted, likely hypertensive/small vessel related. No mass lesion, midline shift or mass effect. No hydrocephalus or extra-axial fluid collection. Pituitary gland suprasellar region within normal limits. Midline structures intact. Vascular: Major intracranial vascular flow voids are maintained. Skull and upper cervical spine: Craniocervical junction within normal limits. Bone marrow signal intensity within normal limits. No scalp soft tissue abnormality. Sinuses/Orbits: Globes and orbital soft tissues within normal limits. Mild scattered mucosal thickening noted within the ethmoidal air cells. Paranasal sinuses are otherwise clear. No significant mastoid effusion. Other: Subtle 4 mm nodular focus seen within the left IAC, which could reflect a small intracanalicular lesion/mass, possibly a schwannoma (series 6, image 7). MRA HEAD FINDINGS Anterior circulation: Both internal carotid  arteries widely patent to the termini without stenosis. A1 segments widely patent. Normal anterior communicating artery complex. Both anterior cerebral arteries widely patent to their distal aspects without stenosis. No M1 stenosis or occlusion. Normal MCA bifurcations. Short-segment severe distal left M3 stenosis noted (series 255, image 7). Distal MCA  branches otherwise well perfused and symmetric. Posterior circulation: Vertebral arteries patent to the vertebrobasilar junction without stenosis. Right vertebral artery dominant. Both PICA origins patent and normal. Basilar widely patent to its distal aspect without stenosis. Superior cerebral arteries patent bilaterally. Both PCAs primarily supplied via the basilar and are well perfused to their distal aspects. Anatomic variants: None significant.  No aneurysm. IMPRESSION: MRI HEAD IMPRESSION: 1. 2 cm acute ischemic nonhemorrhagic perforator type infarct involving the left lentiform and caudate nuclei. 2. Additional punctate acute ischemic nonhemorrhagic infarct inferiorly adjacent to the anterior commissure. 3. Chronic left MCA distribution infarct, with additional chronic hemorrhagic lacunar infarct involving the right frontal corona radiata. 4. Underlying mild chronic microvascular ischemic disease. 5. 4 mm nodular focus within the left IAC, which could reflect a small intracanalicular lesion/mass, possibly a schwannoma. Nonemergent follow-up examination with dedicated IAC protocol MRI of the brain suggested for further evaluation. MRA HEAD IMPRESSION: 1. Negative intracranial MRA for large vessel occlusion. 2. Single short-segment severe distal left M3 stenosis. 3. Otherwise negative intracranial MRA. No other proximal high-grade or correctable stenosis identified. Electronically Signed   By: Jeannine Boga M.D.   On: 02/17/2021 19:43   ECHOCARDIOGRAM COMPLETE  Result Date: 02/18/2021    ECHOCARDIOGRAM REPORT   Patient Name:   Alan Mcdonald Date of  Exam: 02/18/2021 Medical Rec #:  [id number]   Height:       62.0 in Accession #:    [id number]  Weight:       155.0 lb Date of Birth:  June 04, 1951   BSA:          1.715 m Patient Age:    57 years    BP:           155/74 mmHg Patient Gender: M           HR:           55 bpm. Exam Location:  Inpatient Procedure: 2D Echo, Cardiac Doppler and Color Doppler Indications:    CVA  History:        Patient has prior history of Echocardiogram examinations, most                 recent 04/12/2020. Acute MI, Prior CABG; Risk                 Factors:Hypertension. TAVR.                 Aortic Valve: bioprosthetic valve is present in the aortic                 position.  Sonographer:    Cylinder Referring Phys: [place]  1. Left ventricular ejection fraction, by estimation, is 60 to 65%. The left ventricle has normal function. The left ventricle has no regional wall motion abnormalities. There is mild left ventricular hypertrophy. Left ventricular diastolic parameters are indeterminate.  2. Right ventricular systolic function is normal. The right ventricular size is normal. Tricuspid regurgitation signal is inadequate for assessing PA pressure.  3. The mitral valve is normal in structure. No evidence of mitral valve regurgitation. No evidence of mitral stenosis.  4. The aortic valve has been repaired/replaced. Aortic valve regurgitation is not visualized. No aortic stenosis is present. There is a bioprosthetic valve present in the aortic position.  5. The inferior vena cava is normal in size with greater than 50% respiratory variability, suggesting right atrial pressure of 3 mmHg. FINDINGS  Left Ventricle: Left ventricular ejection fraction, by estimation,  is 60 to 65%. The left ventricle has normal function. The left ventricle has no regional wall motion abnormalities. The left ventricular internal cavity size was normal in size. There is  mild left ventricular hypertrophy. Left ventricular diastolic parameters are  indeterminate. Right Ventricle: The right ventricular size is normal. Right ventricular systolic function is normal. Tricuspid regurgitation signal is inadequate for assessing PA pressure. The tricuspid regurgitant velocity is 0.92 m/s, and with an assumed right atrial  pressure of 3 mmHg, the estimated right ventricular systolic pressure is 6.4 mmHg. Left Atrium: Left atrial size was normal in size. Right Atrium: Right atrial size was normal in size. Pericardium: There is no evidence of pericardial effusion. Mitral Valve: The mitral valve is normal in structure. Mild mitral annular calcification. No evidence of mitral valve regurgitation. No evidence of mitral valve stenosis. Tricuspid Valve: The tricuspid valve is normal in structure. Tricuspid valve regurgitation is not demonstrated. No evidence of tricuspid stenosis. Aortic Valve: The aortic valve has been repaired/replaced. Aortic valve regurgitation is not visualized. No aortic stenosis is present. Aortic valve mean gradient measures 3.6 mmHg. Aortic valve peak gradient measures 6.8 mmHg. Aortic valve area, by VTI measures 3.63 cm. There is a bioprosthetic valve present in the aortic position. Pulmonic Valve: The pulmonic valve was not well visualized. Pulmonic valve regurgitation is not visualized. No evidence of pulmonic stenosis. Aorta: The aortic root is normal in size and structure. Venous: The inferior vena cava is normal in size with greater than 50% respiratory variability, suggesting right atrial pressure of 3 mmHg. IAS/Shunts: No atrial level shunt detected by color flow Doppler.  LEFT VENTRICLE PLAX 2D LVIDd:         3.80 cm     Diastology LVIDs:         2.75 cm     LV e' medial:    8.38 cm/s LV PW:         1.70 cm     LV E/e' medial:  13.0 LV IVS:        1.40 cm     LV e' lateral:   7.62 cm/s LVOT diam:     2.00 cm     LV E/e' lateral: 14.3 LV SV:         105 LV SV Index:   61 LVOT Area:     3.14 cm  LV Volumes (MOD) LV vol d, MOD A4C: 49.6 ml LV  vol s, MOD A4C: 17.8 ml LV SV MOD A4C:     49.6 ml RIGHT VENTRICLE            IVC RV S prime:     4.68 cm/s  IVC diam: 1.20 cm TAPSE (M-mode): 1.5 cm LEFT ATRIUM             Index       RIGHT ATRIUM          Index LA diam:        4.60 cm 2.68 cm/m  RA Area:     7.98 cm LA Vol (A2C):   43.1 ml 25.12 ml/m RA Volume:   13.10 ml 7.64 ml/m LA Vol (A4C):   42.4 ml 24.72 ml/m LA Biplane Vol: 44.6 ml 26.00 ml/m  AORTIC VALVE AV Area (Vmax):    3.03 cm AV Area (Vmean):   3.65 cm AV Area (VTI):     3.63 cm AV Vmax:           130.58 cm/s AV Vmean:  80.860 cm/s AV VTI:            0.290 m AV Peak Grad:      6.8 mmHg AV Mean Grad:      3.6 mmHg LVOT Vmax:         126.00 cm/s LVOT Vmean:        93.900 cm/s LVOT VTI:          0.335 m LVOT/AV VTI ratio: 1.16  AORTA Ao Root diam: 3.40 cm Ao Asc diam:  3.00 cm MITRAL VALVE                TRICUSPID VALVE MV Area (PHT): 3.28 cm     TR Peak grad:   3.4 mmHg MV E velocity: 109.00 cm/s  TR Vmax:        92.30 cm/s MV A velocity: 106.00 cm/s MV E/A ratio:  1.03         SHUNTS                             Systemic VTI:  0.34 m                             Systemic Diam: 2.00 cm Kirk Ruths MD Electronically signed by Kirk Ruths MD Signature Date/Time: 02/18/2021/1:15:46 PM    Final      Labs:   Basic Metabolic Panel: Recent Labs  Lab 02/17/21 1033 02/17/21 1046  NA 140 142  K 3.5 3.6  CL 107 107  CO2 23  --   GLUCOSE 138* 139*  BUN 12 10  CREATININE 0.72 0.70  CALCIUM 9.0  --    GFR Estimated Creatinine Clearance: 73 mL/min (by C-G formula based on SCr of 0.7 mg/dL). Liver Function Tests: Recent Labs  Lab 02/17/21 1033  AST 22  ALT 18  ALKPHOS 78  BILITOT 1.1  PROT 5.8*  ALBUMIN 3.8   No results for input(s): LIPASE, AMYLASE in the last 168 hours. No results for input(s): AMMONIA in the last 168 hours. Coagulation profile Recent Labs  Lab 02/17/21 1033  INR 1.1    CBC: Recent Labs  Lab 02/17/21 1033 02/17/21 1046  WBC 6.3   --   NEUTROABS 5.0  --   HGB 13.7 12.6*  HCT 39.6 37.0*  MCV 93.0  --   PLT 98*  --    Cardiac Enzymes: No results for input(s): CKTOTAL, CKMB, CKMBINDEX, TROPONINI in the last 168 hours. BNP: Invalid input(s): POCBNP CBG: Recent Labs  Lab 02/17/21 1034  GLUCAP 135*   D-Dimer No results for input(s): DDIMER in the last 72 hours. Hgb A1c Recent Labs    02/18/21 0500  HGBA1C 5.1   Lipid Profile No results for input(s): CHOL, HDL, LDLCALC, TRIG, CHOLHDL, LDLDIRECT in the last 72 hours. Thyroid function studies No results for input(s): TSH, T4TOTAL, T3FREE, THYROIDAB in the last 72 hours.  Invalid input(s): FREET3 Anemia work up No results for input(s): VITAMINB12, FOLATE, FERRITIN, TIBC, IRON, RETICCTPCT in the last 72 hours. Microbiology Recent Results (from the past 240 hour(s))  Resp Panel by RT-PCR (Flu A&B, Covid) Nasopharyngeal Swab     Status: None   Collection Time: 02/17/21 10:30 AM   Specimen: Nasopharyngeal Swab; Nasopharyngeal(NP) swabs in vial transport medium  Result Value Ref Range Status   SARS Coronavirus 2 by RT PCR NEGATIVE NEGATIVE Final    Comment: (NOTE) SARS-CoV-2 target  nucleic acids are NOT DETECTED.  The SARS-CoV-2 RNA is generally detectable in upper respiratory specimens during the acute phase of infection. The lowest concentration of SARS-CoV-2 viral copies this assay can detect is 138 copies/mL. A negative result does not preclude SARS-Cov-2 infection and should not be used as the sole basis for treatment or other patient management decisions. A negative result may occur with  improper specimen collection/handling, submission of specimen other than nasopharyngeal swab, presence of viral mutation(s) within the areas targeted by this assay, and inadequate number of viral copies(<138 copies/mL). A negative result must be combined with clinical observations, patient history, and epidemiological information. The expected result is  Negative.  Fact Sheet for Patients:  EntrepreneurPulse.com.au  Fact Sheet for Healthcare Providers:  IncredibleEmployment.be  This test is no t yet approved or cleared by the [place] FDA and  has been authorized for detection and/or diagnosis of SARS-CoV-2 by FDA under an Emergency Use Authorization (EUA). This EUA will remain  in effect (meaning this test can be used) for the duration of the COVID-19 declaration under Section 564(b)(1) of the Act, 21 U.S.C.section 360bbb-3(b)(1), unless the authorization is terminated  or revoked sooner.       Influenza A by PCR NEGATIVE NEGATIVE Final   Influenza B by PCR NEGATIVE NEGATIVE Final    Comment: (NOTE) The Xpert Xpress SARS-CoV-2/FLU/RSV plus assay is intended as an aid in the diagnosis of influenza from Nasopharyngeal swab specimens and should not be used as a sole basis for treatment. Nasal washings and aspirates are unacceptable for Xpert Xpress SARS-CoV-2/FLU/RSV testing.  Fact Sheet for Patients: EntrepreneurPulse.com.au  Fact Sheet for Healthcare Providers: IncredibleEmployment.be  This test is not yet approved or cleared by the [place] FDA and has been authorized for detection and/or diagnosis of SARS-CoV-2 by FDA under an Emergency Use Authorization (EUA). This EUA will remain in effect (meaning this test can be used) for the duration of the COVID-19 declaration under Section 564(b)(1) of the Act, 21 U.S.C. section 360bbb-3(b)(1), unless the authorization is terminated or revoked.  Performed at [facility], [address], [place], [place] [address]      Discharge Instructions:   Discharge Instructions     Diet - low sodium heart healthy   Complete by: As directed    Increase activity slowly   Complete by: As directed       Allergies as of 02/18/2021   No Known Allergies      Medication List     TAKE these  medications    acetaminophen 500 MG tablet Commonly known as: TYLENOL Take 500 mg by mouth every 6 (six) hours as needed for mild pain or moderate pain.   aspirin EC 81 MG tablet Take 81 mg by mouth daily.   atorvastatin 80 MG tablet Commonly known as: LIPITOR Take 1 tablet (80 mg total) by mouth every evening.   carvedilol 3.125 MG tablet Commonly known as: COREG Take 1 tablet (3.125 mg total) by mouth 2 (two) times daily with a meal.   cholecalciferol 25 MCG (1000 UNIT) tablet Commonly known as: VITAMIN D3 Take 1,000 Units by mouth daily.   clopidogrel 75 MG tablet Commonly known as: PLAVIX Take 75 mg by mouth daily.   Co Q-10 200 MG Caps Take 1 tablet by mouth daily.   glucosamine-chondroitin 500-400 MG tablet Take 1 tablet by mouth daily.   losartan 100 MG tablet Commonly known as: COZAAR Take 1 tablet (100 mg total) by mouth daily.  multivitamin with minerals Tabs tablet Take 1 tablet by mouth daily.   RA Krill Oil 500 MG Caps Take 500 mg by mouth daily.   Turmeric 500 MG Caps Take 500 mg by mouth daily.   Vitamin B-Complex Tabs Take 1 tablet by mouth daily.   Zinc 30 MG Caps Take 30 mg by mouth daily.        Follow-up Information     [facility]. Schedule an appointment as soon as possible for a visit in 1 month(s).   Contact information: [place], [place] [phone]                  If you experience worsening of your admission symptoms, develop shortness of breath, life threatening emergency, suicidal or homicidal thoughts you must seek medical attention immediately by calling 911 or calling your MD immediately  if symptoms less severe.   You must read complete instructions/literature along with all the possible adverse reactions/side effects for all the medicines you take and that have been prescribed to you. Take any new medicines after you have completely understood and accept  all the possible adverse reactions/side effects.    Please note   You were cared for by a hospitalist during your hospital stay. If you have any questions about your discharge medications or the care you received while you were in the hospital after you are discharged, you can call the unit and asked to speak with the hospitalist on call if the hospitalist that took care of you is not available. Once you are discharged, your primary care physician will handle any further medical issues. Please note that NO REFILLS for any discharge medications will be authorized once you are discharged, as it is imperative that you return to your primary care physician (or establish a relationship with a primary care physician if you do not have one) for your aftercare needs so that they can reassess your need for medications and monitor your lab values.       Time coordinating discharge: 35 minutes  Signed:  Fortune Brannigan  [facility] 02/18/2021, 5:18 PM   Pager on [email or web address]. If 7PM-7AM, please contact night-coverage at [email or web address]

## 2021-02-18 NOTE — Evaluation (Signed)
Physical Therapy Evaluation Patient Details Name: Alan Mcdonald MRN: [id number] DOB: 09-Jun-1951 Today's Date: 02/18/2021   History of Present Illness  The pt is a 70 yo male presenting with stoke sx of difficulty walking with "struggling to the right", "facial droop to right eye", trouble speaking, difficulty writing, as well as "cloudy" vision. CT revealed subacute L frontal CVA, MRI revealed 2 cm acute ischemic infarct of L lentiform and caudate nuclei. PMH includes: CAD, HTN, s/p aortic valve replacement, s/p CABG x2, NSTEMI, and Parkinsonism.   Clinical Impression  The pt was agreeable to PT eval at this time. The pt reports he was completely independent with all mobility, enjoys working out 5 days/week, and lives with his wife PTA. Despite deficits on admission the pt demos no difference in strength to MMT on evaluation, and denies difference in sensation bilaterally. He was able to walk >200 ft with no AD and no LOB as well as complete 4 stairs without need for rail or assistance. The pt is likely at baseline for mobility, no further acute PT needs. The pt and his wife were educated on progressive return to activities and BE FAST stroke signs and sx. Thank you for the consult, please re-consult if change in status.     Follow Up Recommendations No PT follow up;Supervision for mobility/OOB    Equipment Recommendations  None recommended by PT    Recommendations for Other Services       Precautions / Restrictions Precautions Precautions: None Restrictions Weight Bearing Restrictions: No      Mobility  Bed Mobility Overal bed mobility: Independent                  Transfers Overall transfer level: Independent Equipment used: None             General transfer comment: no assist, no evidence of instability  Ambulation/Gait Ambulation/Gait assistance: Supervision Gait Distance (Feet): 200 Feet Assistive device: None Gait Pattern/deviations: Step-through  pattern;WFL(Within Functional Limits) Gait velocity: 0.9 m/s Gait velocity interpretation: 1.31 - 2.62 ft/sec, indicative of limited community ambulator General Gait Details: pt with normal gait speed, no evidence of instability  Stairs Stairs: Yes Stairs assistance: Supervision Stair Management: No rails;Alternating pattern;Forwards Number of Stairs: 4 General stair comments: no use of rails, no instability  Wheelchair Mobility    Modified Rankin (Stroke Patients Only) Modified Rankin (Stroke Patients Only) Pre-Morbid Rankin Score: No symptoms Modified Rankin: Moderate disability     Balance Overall balance assessment: Independent                               Standardized Balance Assessment Standardized Balance Assessment : Dynamic Gait Index   Dynamic Gait Index Level Surface: Normal Change in Gait Speed: Normal Gait with Horizontal Head Turns: Normal Gait with Vertical Head Turns: Normal Gait and Pivot Turn: Normal Step Over Obstacle: Mild Impairment Step Around Obstacles: Normal Steps: Normal Total Score: 23       Pertinent Vitals/Pain Pain Assessment: No/denies pain    Home Living Family/patient expects to be discharged to:: Private residence Living Arrangements: Spouse/significant other Available Help at Discharge: Family;Available 24 hours/day Type of Home: House Home Access: Stairs to enter Entrance Stairs-Rails: Psychiatric nurse of Steps: 2 Home Layout: Two level;Able to live on main level with bedroom/bathroom Home Equipment: Gilford Rile - 2 wheels      Prior Function Level of Independence: Independent         Comments:  pt returned to full independence, very active, working out 5 days/week PTA     Hand Dominance        Extremity/Trunk Assessment   Upper Extremity Assessment Upper Extremity Assessment: Overall WFL for tasks assessed    Lower Extremity Assessment Lower Extremity Assessment: Overall WFL for  tasks assessed (no difference in strength or sensation)    Cervical / Trunk Assessment Cervical / Trunk Assessment: Normal  Communication   Communication: No difficulties  Cognition Arousal/Alertness: Awake/alert Behavior During Therapy: WFL for tasks assessed/performed Overall Cognitive Status: Within Functional Limits for tasks assessed                                               Assessment/Plan    PT Assessment Patent does not need any further PT services  PT Problem List         PT Treatment Interventions      PT Goals (Current goals can be found in the Care Plan section)  Acute Rehab PT Goals Patient Stated Goal: return home, return to workout activities PT Goal Formulation: With patient Time For Goal Achievement: 02/25/21 Potential to Achieve Goals: Good     AM-PAC PT "6 Clicks" Mobility  Outcome Measure Help needed turning from your back to your side while in a flat bed without using bedrails?: None Help needed moving from lying on your back to sitting on the side of a flat bed without using bedrails?: None Help needed moving to and from a bed to a chair (including a wheelchair)?: None Help needed standing up from a chair using your arms (e.g., wheelchair or bedside chair)?: None Help needed to walk in hospital room?: A Little Help needed climbing 3-5 steps with a railing? : A Little 6 Click Score: 22    End of Session   Activity Tolerance: Patient tolerated treatment well Patient left: in bed;with call bell/phone within reach;with family/visitor present;with nursing/sitter in room Nurse Communication: Mobility status (no PT needs) PT Visit Diagnosis: Other abnormalities of gait and mobility (R26.89)    Time: [id number] PT Time Calculation (min) (ACUTE ONLY): 16 min   Charges:   PT Evaluation $PT Eval Low Complexity: 1 Low          West Carbo, PT, DPT   Acute Rehabilitation Department Pager #: [phone]  Sandra Cockayne 02/18/2021, 12:48 PM

## 2021-02-18 NOTE — ED Notes (Signed)
This RN attempted to call [facility], told they will call back in few to receive report.

## 2021-02-20 ENCOUNTER — Encounter: Payer: Medicare Other | Admitting: Thoracic Surgery (Cardiothoracic Vascular Surgery)

## 2021-02-20 DIAGNOSIS — M76822 Posterior tibial tendinitis, left leg: Secondary | ICD-10-CM | POA: Diagnosis not present

## 2021-02-20 MED ORDER — BETAMETHASONE SOD PHOS & ACET 6 (3-3) MG/ML IJ SUSP
3.0000 mg | Freq: Once | INTRAMUSCULAR | Status: AC
  Administered 2021-02-20: 3 mg via INTRA_ARTICULAR

## 2021-02-20 NOTE — Progress Notes (Signed)
    HPI: 70 y.o. male presenting today for new complaint regarding pain and tenderness to the left foot and ankle.  Patient states that he gets recurring pain to this area.  He does have a history of gout.  He is concerned for an acute gout flareup.  No injuries.  He says injections of helped in the past relieve his pain.  Patient constantly walks and is having trouble with his activities.  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  Dermatology: Skin is warm, dry and supple bilateral lower extremities. Negative for open lesions or macerations.  Vascular: Palpable pedal pulses bilaterally. No edema or erythema noted. Capillary refill within normal limits.  Neurological: Epicritic and protective threshold grossly intact bilaterally.   Musculoskeletal Exam: Pain on palpation noted along posterior tibial tendon of the left lower extremity. Range of motion within normal limits. Muscle strength 5/5 in all muscle groups bilateral lower extremities.  Radiographic Exam:  Normal osseous mineralization. Joint spaces preserved. No fracture or dislocation identified.    Assessment: 1. Posterior tibial tendinitis left   Plan of Care:  1. Patient was evaluated. Radiographs were reviewed today. 2. Injection of 0.5 mL Celestone Soluspan injected into the posterior tibial tendon sheath.  3.  Continue OTC Tylenol as needed 4.  Last visit allopurinol was recommended for acute recurrent gout attacks.  However, the patient states that his PCP did not recommend allopurinol since he is on so many different  medications. 5.  Return to clinic as needed   Edrick Kins, DPM [facility]  Dr. Edrick Kins, DPM    [address], [place] [address]                Office [phone]  Fax [phone]

## 2021-03-20 ENCOUNTER — Encounter (HOSPITAL_COMMUNITY): Payer: Self-pay | Admitting: Radiology

## 2021-04-05 ENCOUNTER — Ambulatory Visit (INDEPENDENT_AMBULATORY_CARE_PROVIDER_SITE_OTHER): Payer: Medicare Other | Admitting: Neurology

## 2021-04-05 ENCOUNTER — Other Ambulatory Visit: Payer: Self-pay

## 2021-04-05 ENCOUNTER — Encounter: Payer: Self-pay | Admitting: Neurology

## 2021-04-05 ENCOUNTER — Telehealth: Payer: Self-pay | Admitting: Neurology

## 2021-04-05 VITALS — BP 164/74 | HR 88 | Ht 62.0 in | Wt 155.0 lb

## 2021-04-05 DIAGNOSIS — I6330 Cerebral infarction due to thrombosis of unspecified cerebral artery: Secondary | ICD-10-CM

## 2021-04-05 NOTE — Telephone Encounter (Signed)
Medicare/GPM order sent to [facility], [facility] they will reach out to the patient to schedule.

## 2021-04-05 NOTE — Patient Instructions (Signed)
Continue with Aspirin 162 mg and Plavix 75 mg daily  Strict blood pressure management  Will check a lipid panel and CTA neck today  Follow up in 6 months.

## 2021-04-05 NOTE — Progress Notes (Addendum)
[facility]  PATIENT: Alan Mcdonald DOB: April 24, 1951  REFERRING CLINICIAN: [facility] HISTORY FROM: Patient and spouse  REASON FOR VISIT: Stroke    HISTORICAL  CHIEF COMPLAINT:  Chief Complaint  Patient presents with   New Patient (Initial Visit)    Rm 41, with wife, states he is doing well, would like discuss prevention treatment     HISTORY OF PRESENT ILLNESS:  This is a 70 year old gentleman with past medical history of hypertension, hyperlipidemia, CAD status post CABG and aortic valve replacement last September 2021 previously on aspirin 81 mg and Plavix 75 mg for total of 1 year who was recently admitted in the hospital for acute stroke.  Patient presented to the hospital after period of confusion, feeling cloudy, slurred speech, and difficulty writing.  He was found to have a acute ischemic stroke involving the left lentiform and caudate nucleus.  He did have the whole stroke work-up including MRI, MRA of the head and echocardiogram, a MRA or CTA of the neck was not performed.  He was seen with PT, did well and was discharged without any further outpatient physical therapy.  Plan at discharge was to continue Plavix 75 mg and to increase aspirin to 162 mg.  Since discharge he reported he is doing much better he continue on aspirin 162 mg and and Plavix 75 mg, no additional complaints.  He continues to exercise.   OTHER MEDICAL CONDITIONS: Hypertension, hyperlipidemia, CAD status post CABG and aortic valve replacement last September 2021 on aspirin and Plavix, left acoustic neuroma   REVIEW OF SYSTEMS: Full 14 system review of systems performed and negative with exception of: As noted in the HPI  ALLERGIES: No Known Allergies  HOME MEDICATIONS: Outpatient Medications Prior to Visit  Medication Sig Dispense Refill   acetaminophen (TYLENOL) 500 MG tablet Take 500 mg by mouth every 6 (six) hours as needed for mild pain or moderate pain.     aspirin  EC 81 MG tablet Take 2 tablets (162 mg total) by mouth daily.     atorvastatin (LIPITOR) 80 MG tablet Take 1 tablet (80 mg total) by mouth every evening. 30 tablet 1   B Complex Vitamins (VITAMIN B-COMPLEX) TABS Take 1 tablet by mouth daily.      carvedilol (COREG) 3.125 MG tablet Take 1 tablet (3.125 mg total) by mouth 2 (two) times daily with a meal. 60 tablet 1   cholecalciferol (VITAMIN D3) 25 MCG (1000 UNIT) tablet Take 1,000 Units by mouth daily.     clopidogrel (PLAVIX) 75 MG tablet Take 75 mg by mouth daily.     Coenzyme Q10 (CO Q-10) 200 MG CAPS Take 1 tablet by mouth daily.     glucosamine-chondroitin 500-400 MG tablet Take 1 tablet by mouth daily.     losartan (COZAAR) 100 MG tablet Take 1 tablet (100 mg total) by mouth daily. 90 tablet 3   Multiple Vitamin (MULTIVITAMIN WITH MINERALS) TABS tablet Take 1 tablet by mouth daily.     RA KRILL OIL 500 MG CAPS Take 500 mg by mouth daily.     Turmeric 500 MG CAPS Take 500 mg by mouth daily.     Zinc 30 MG CAPS Take 30 mg by mouth daily.     No facility-administered medications prior to visit.    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   LIMA to LAD SVG to OM    PAST SURGICAL HISTORY: Past Surgical History:  Procedure Laterality Date   AORTIC VALVE REPLACEMENT N/A 02/22/2020   Procedure: AORTIC VALVE REPLACEMENT (AVR) USING EDWARDS Resilia 25 MM AORTIC VALVE.;  Surgeon: Rexene Alberts, MD;  Location: [facility];  Service: Open Heart Surgery;  Laterality: N/A;   CARDIAC CATHETERIZATION     CERVICAL SPINE SURGERY     CHOLECYSTECTOMY     CORONARY ARTERY BYPASS GRAFT N/A 02/22/2020   Procedure: CORONARY ARTERY BYPASS GRAFTING (CABG)  USING LIMA to LAD; ENDSCOPICALLY HARVEST RIGHT GREATER SAPHENOUS VEIN: SVG to OM1;  Surgeon: Rexene Alberts, MD;  Location: [facility];  Service: Open Heart Surgery;  Laterality: N/A;   ELBOW SURGERY     ENDOVEIN HARVEST OF GREATER SAPHENOUS VEIN Right 02/22/2020   Procedure: ENDOVEIN HARVEST OF GREATER SAPHENOUS VEIN;  Surgeon: Rexene Alberts, MD;  Location: [facility];  Service: Open Heart Surgery;  Laterality: Right;   KIDNEY STONE SURGERY     LEFT HEART CATH AND CORONARY ANGIOGRAPHY N/A 02/22/2020   Procedure: LEFT HEART CATH AND CORONARY ANGIOGRAPHY;  Surgeon: Belva Crome, MD;  Location: [facility];  Service: Cardiovascular;  Laterality: N/A;   TEE WITHOUT CARDIOVERSION N/A 02/22/2020   Procedure: TRANSESOPHAGEAL ECHOCARDIOGRAM (TEE);  Surgeon: Rexene Alberts, MD;  Location: [facility];  Service: Open Heart Surgery;  Laterality: N/A;    FAMILY HISTORY: History reviewed. No pertinent family history.  SOCIAL HISTORY: Social History   Socioeconomic History   Marital status: Married    Spouse name: Not on file   Number of children: Not on file   Years of education: 14   Highest education level: Associate degree: occupational, Hotel manager, or vocational program  Occupational History   Occupation: semi retired  Tobacco Use   Smoking status: Former    Packs/day: 0.50    Years: 25.00    Pack years: 12.50    Types: Cigarettes    Quit date: 2003    Years since quitting: 19.8   Smokeless tobacco: Never  Vaping Use   Vaping Use: Never used  Substance and Sexual Activity   Alcohol use: Yes    Comment: occasional   Drug use: Never   Sexual activity: Not on file  Other Topics Concern   Not on file  Social History Narrative   Not on file   Social Determinants of Health   Financial Resource Strain: Not on file  Food Insecurity: Not on file  Transportation Needs: Not on file  Physical Activity: Not on file  Stress: Not on file  Social Connections: Not on file  Intimate Partner  Violence: Not on file     PHYSICAL EXAM  GENERAL EXAM/CONSTITUTIONAL: Vitals:  Vitals:   04/05/21 0920  BP: (!) 164/74  Pulse: 88  Weight: 155 lb (70.3 kg)  Height: 5\' 2"  (1.575 m)   Body mass index is 28.35 kg/m. Wt Readings from Last 3 Encounters:  04/05/21 155 lb (70.3 kg)  02/17/21 155 lb (70.3 kg)  01/12/21 160 lb (72.6 kg)   Patient is in no distress; well developed, nourished and groomed; neck is supple  EYES: Pupils round and reactive to light, Visual fields full to confrontation, Extraocular movements intacts,   MUSCULOSKELETAL: Gait, strength, tone, movements  noted in Neurologic exam below  NEUROLOGIC: MENTAL STATUS:  No flowsheet data found. awake, alert, oriented to person, place and time recent and remote memory intact normal attention and concentration language fluent, comprehension intact, naming intact fund of knowledge appropriate  CRANIAL NERVE:  2nd, 3rd, 4th, 6th - pupils equal and reactive to light, visual fields full to confrontation, extraocular muscles intact, no nystagmus 5th - facial sensation symmetric 7th - facial strength symmetric 8th - hearing intact 9th - palate elevates symmetrically, uvula midline 11th - shoulder shrug symmetric 12th - tongue protrusion midline  MOTOR:  normal bulk and tone, full strength in the BUE, BLE  SENSORY:  normal and symmetric to light touch, pinprick, temperature, vibration  COORDINATION:  finger-nose-finger, fine finger movements normal  REFLEXES:  deep tendon reflexes present and symmetric  GAIT/STATION:  normal   DIAGNOSTIC DATA (LABS, IMAGING, TESTING) - I reviewed patient records, labs, notes, testing and imaging myself where available.  Lab Results  Component Value Date   WBC 6.3 02/17/2021   HGB 12.6 (L) 02/17/2021   HCT 37.0 (L) 02/17/2021   MCV 93.0 02/17/2021   PLT 98 (L) 02/17/2021      Component Value Date/Time   NA 142 02/17/2021 1046   NA 141 04/28/2020 0851   K  3.6 02/17/2021 1046   CL 107 02/17/2021 1046   CO2 23 02/17/2021 1033   GLUCOSE 139 (H) 02/17/2021 1046   BUN 10 02/17/2021 1046   BUN 13 04/28/2020 0851   CREATININE 0.70 02/17/2021 1046   CALCIUM 9.0 02/17/2021 1033   PROT 5.8 (L) 02/17/2021 1033   PROT 6.2 04/28/2020 0851   ALBUMIN 3.8 02/17/2021 1033   ALBUMIN 4.3 04/28/2020 0851   AST 22 02/17/2021 1033   ALT 18 02/17/2021 1033   ALKPHOS 78 02/17/2021 1033   BILITOT 1.1 02/17/2021 1033   BILITOT 0.5 04/28/2020 0851   GFRNONAA >60 02/17/2021 1033   GFRAA 105 04/28/2020 0851   Lab Results  Component Value Date   CHOL 131 04/28/2020   HDL 36 (L) 04/28/2020   LDLCALC 69 04/28/2020   TRIG 152 (H) 04/28/2020   CHOLHDL 3.6 04/28/2020   Lab Results  Component Value Date   HGBA1C 5.1 02/18/2021   No results found for: VITAMINB12 No results found for: TSH   MRI HEAD IMPRESSION: 1. 2 cm acute ischemic nonhemorrhagic perforator type infarct involving the left lentiform and caudate nuclei. 2. Additional punctate acute ischemic nonhemorrhagic infarct inferiorly adjacent to the anterior commissure. 3. Chronic left MCA distribution infarct, with additional chronic hemorrhagic lacunar infarct involving the right frontal corona radiata. 4. Underlying mild chronic microvascular ischemic disease. 5. 4 mm nodular focus within the left IAC, which could reflect a small intracanalicular lesion/mass, possibly a schwannoma. Nonemergent follow-up examination with dedicated IAC protocol MRI of the brain suggested for further evaluation.   MRA HEAD IMPRESSION: 1. Negative intracranial MRA for large vessel occlusion. 2. Single short-segment severe distal left M3 stenosis. 3. Otherwise negative intracranial MRA. No other proximal high-grade or correctable stenosis identified.   Echocardiogram 1. Left ventricular ejection fraction, by estimation, is 60 to 65%. The  left ventricle has normal function. The left ventricle has no regional wall  motion abnormalities. There is mild left ventricular hypertrophy. Left ventricular diastolic parameters are indeterminate.   2. Right ventricular systolic function is normal. The right ventricular size is normal. Tricuspid regurgitation signal is inadequate for assessing PA pressure.   3. The mitral valve is normal in structure. No  evidence of mitral valve regurgitation. No evidence of mitral stenosis.   4. The aortic valve has been repaired/replaced. Aortic valve regurgitation is not visualized. No aortic stenosis is present. There is a bioprosthetic valve present in the aortic position.   5. The inferior vena cava is normal in size with greater than 50% respiratory variability, suggesting right atrial pressure of 3 mmHg.  ASSESSMENT AND PLAN  70 y.o. year old male with vascular risk factors including hypertension, hyperlipidemia, CAD status post CABG who was found to have a recent left lentiform and caudate nuclei stroke.  Stroke etiology likely small vessel disease but cannot rule out large vessel.  He reported his blood pressure usually runs in the 140 -150s at home, today on exam it was 164/74.  He is on a statin and his LDL done in November 2021 was 69.  He does not have any history of diabetes, his last A1c is 5.1.  When he comes to stroke risk factors, he is well managed except his blood pressure that sometimes runs high.  I will repeat the lipid panel since his been almost a year and also obtain a CT angiogram of the neck.  Patient was already on Plavix and aspirin from a cardiac perspective, now after having a stroke while on Aspirin and Plavix, it is reasonable to continue both medications and increase the aspirin to 162 mg.  Discussed with patient bleeding risk factor and signs to prompt him to seek medical treatment.  I will contact the patient after completion of the CT angiogram of the neck otherwise I will see him in 6 months for follow-up.   1. Cerebral thrombosis with cerebral infarction      PLAN: Continue with Aspirin 162 mg and Plavix 75 mg daily  Strict blood pressure management  Will check a lipid panel and CTA neck today  Follow up in 6 months.    Orders Placed This Encounter  Procedures   CT ANGIO NECK W OR WO CONTRAST   Lipid Panel    No orders of the defined types were placed in this encounter.   Return in about 6 months (around 10/04/2021).    Alric Ran, MD 04/05/2021, 9:57 PM  [facility] [address] [place], [place] [address] [phone]

## 2021-04-06 LAB — LIPID PANEL
Chol/HDL Ratio: 3.1 ratio (ref 0.0–5.0)
Cholesterol, Total: 118 mg/dL (ref 100–199)
HDL: 38 mg/dL — ABNORMAL LOW (ref >39)
LDL Chol Calc (NIH): 64 mg/dL (ref 0–99)
Triglycerides: 79 mg/dL (ref 0–149)
VLDL Cholesterol Cal: 16 mg/dL (ref 5–40)

## 2021-04-08 ENCOUNTER — Other Ambulatory Visit: Payer: Self-pay | Admitting: Cardiology

## 2021-04-08 MED ORDER — AMLODIPINE BESYLATE 5 MG PO TABS: 5.0000 mg | ORAL_TABLET | Freq: Every day | ORAL | 3 refills | Status: DC

## 2021-04-10 ENCOUNTER — Telehealth: Payer: Self-pay | Admitting: *Deleted

## 2021-04-10 NOTE — Telephone Encounter (Signed)
Gural, Jaymian "Smitty"  [place] Triage (supporting Jerline Pain, MD) 2 days ago   Thank you    [place], Thana Farr, MD  [place], Shanna "Smitty" 2 days ago   Thank you for the update. I will go ahead and start amlodipine 5mg  once a day for you. I will send into your pharmacy Candee Furbish, MD    Michaelyn Barter, RN routed conversation to You; Jerline Pain, MD 3 days ago   [place], Erick "Smitty"  [place] Triage (supporting [place], Thana Farr, MD) 3 days ago   Advice is needed: I had a stroke Sept 11, did a f/u visit with neurologist he has concerns on my bp. I have monitored different times a day running 160 above.  Sloan Savin  RX for Amlodipine was sent into pharmacy 04/08/2021

## 2021-04-14 ENCOUNTER — Ambulatory Visit
Admission: RE | Admit: 2021-04-14 | Discharge: 2021-04-14 | Disposition: A | Discharge status: Home / Self Care | Payer: Medicare Other | Source: Ambulatory Visit | Attending: Neurology | Admitting: Neurology

## 2021-04-14 DIAGNOSIS — I6330 Cerebral infarction due to thrombosis of unspecified cerebral artery: Secondary | ICD-10-CM

## 2021-04-14 IMAGING — CT CT ANGIO NECK
2 of 3 series · 9 of 32 positions shown, 14 images · IV contrast (APPLIED)
Comparison: None.

CLINICAL DATA: Stroke and TIA

EXAM:
CT ANGIOGRAPHY NECK
TECHNIQUE: Multidetector CT imaging of the neck was performed using the
standard protocol during bolus administration of intravenous
contrast. Multiplanar CT image reconstructions and MIPs were
obtained to evaluate the vascular anatomy. Carotid stenosis
measurements (when applicable) are obtained utilizing NASCET
criteria, using the distal internal carotid diameter as the
denominator.
CONTRAST:  75mL [5X] IOPAMIDOL ([5X]) INJECTION 76%

[Series 5: carotid angio · axial · 0.47mm/px · z∈[+779,+1001]mm · 7 of 149 slices shown, 12 images]
[im 19/149  soft-tissue]
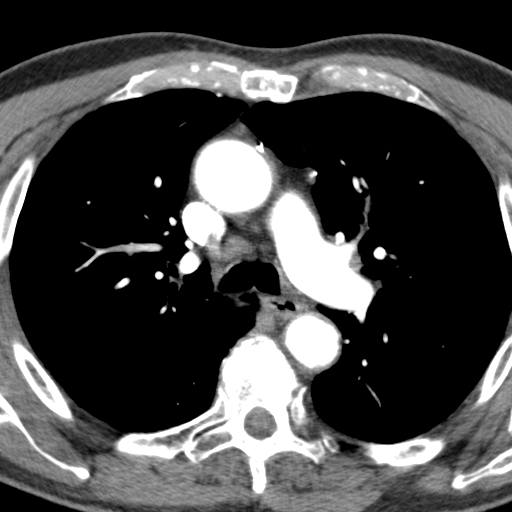
[im 19/149  bone]
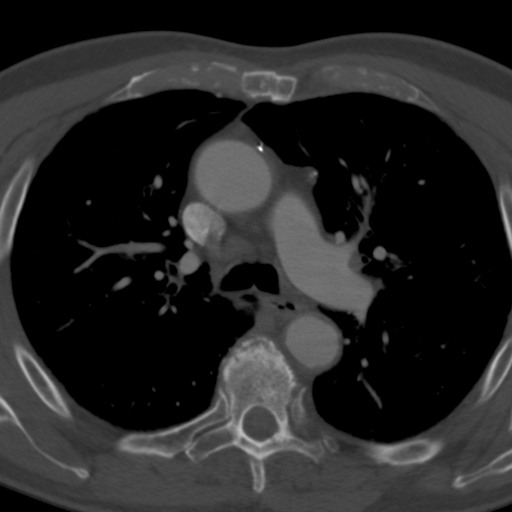
[im 38/149  soft-tissue]
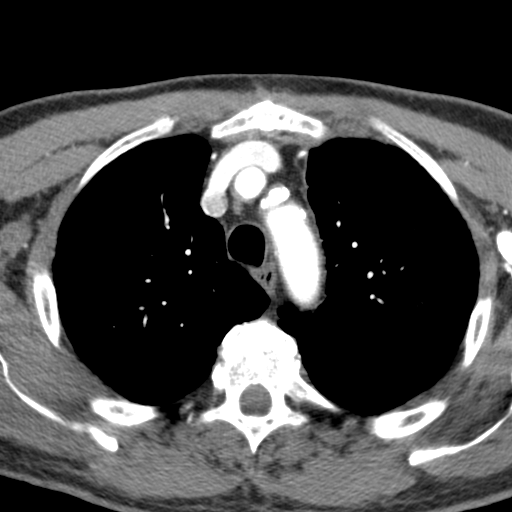
[im 56/149  soft-tissue]
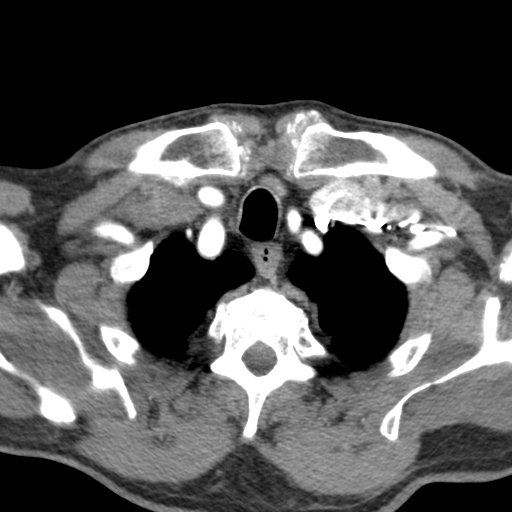
[im 75/149  soft-tissue]
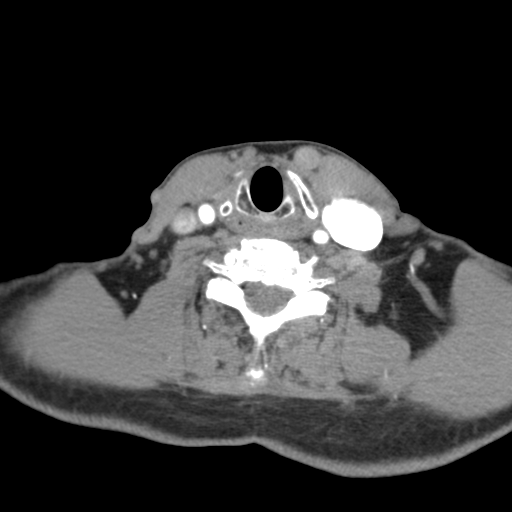
[im 75/149  lung]
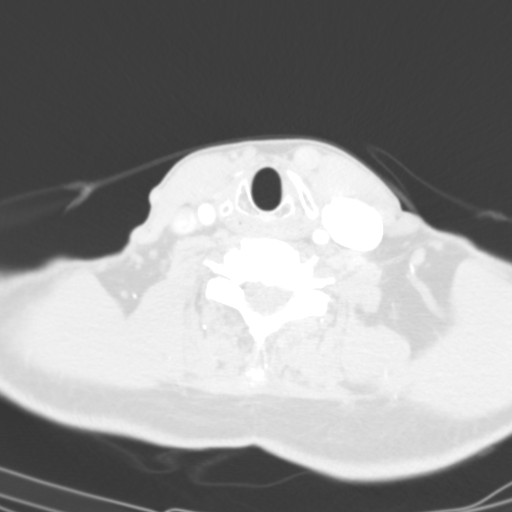
[im 93/149  soft-tissue]
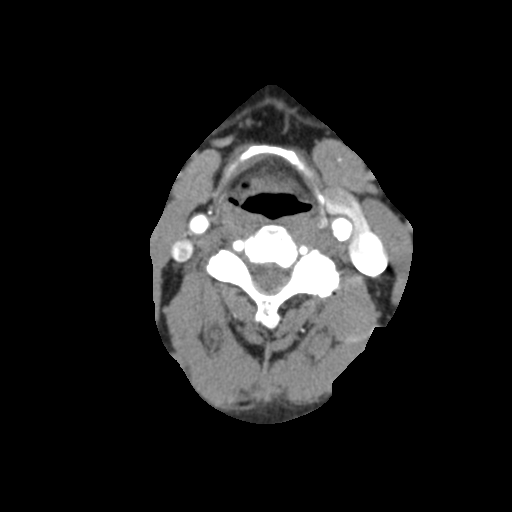
[im 93/149  lung]
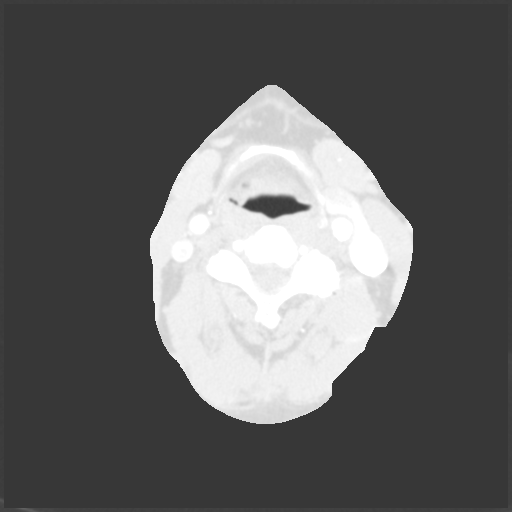
[im 112/149  soft-tissue]
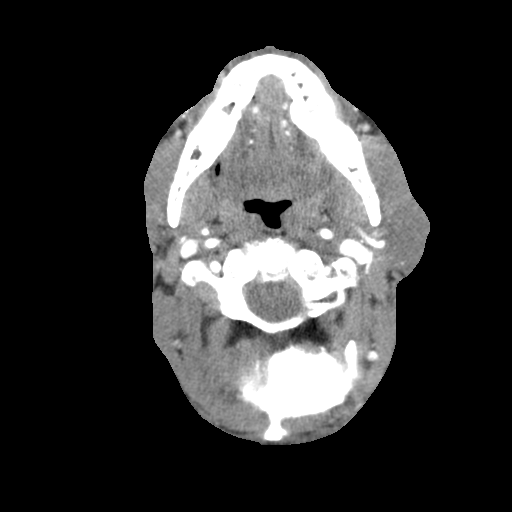
[im 112/149  lung]
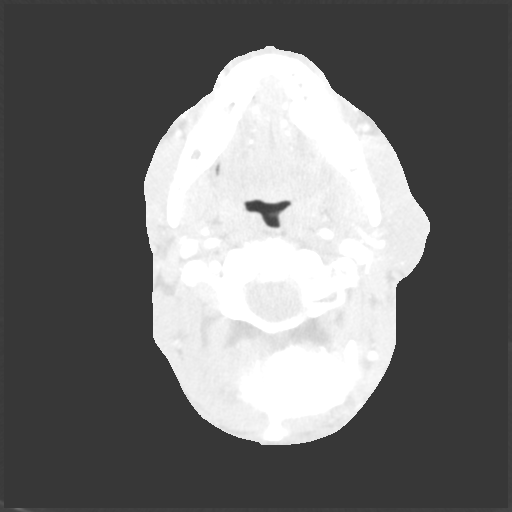
[im 130/149  soft-tissue]
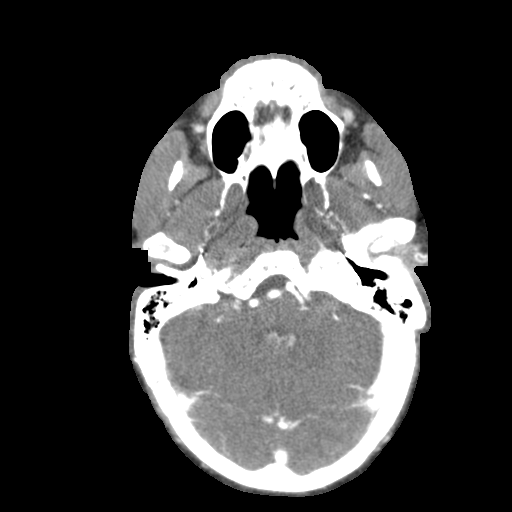
[im 130/149  lung]
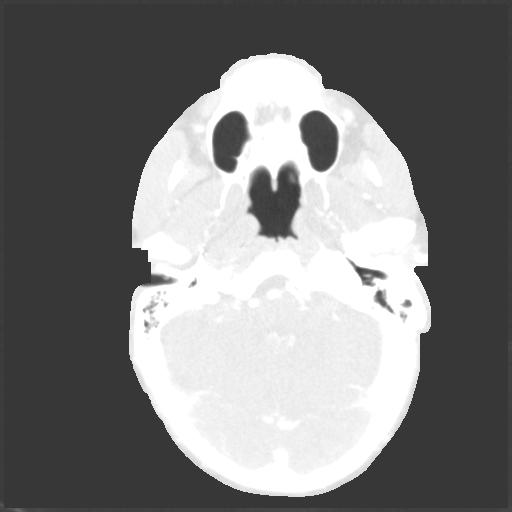

[Series 12: axial thick · axial · 0.36mm/px · z∈[+845,+940]mm · 2 of 57 slices shown]
[im 19/57  soft-tissue]
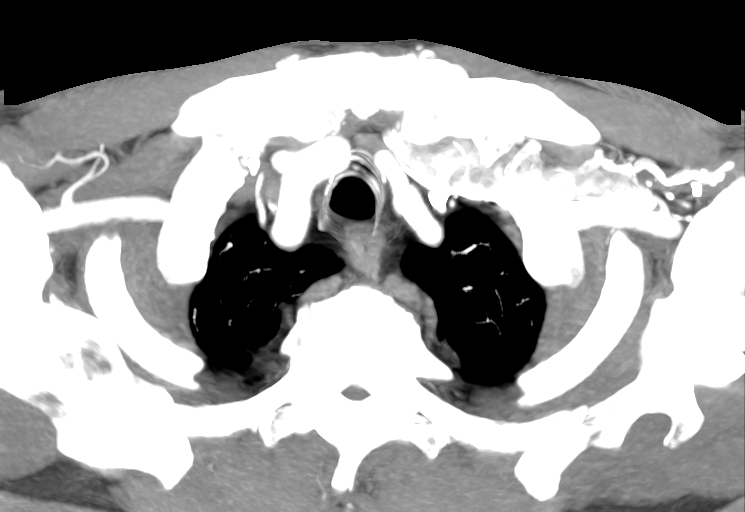
[im 38/57  soft-tissue]
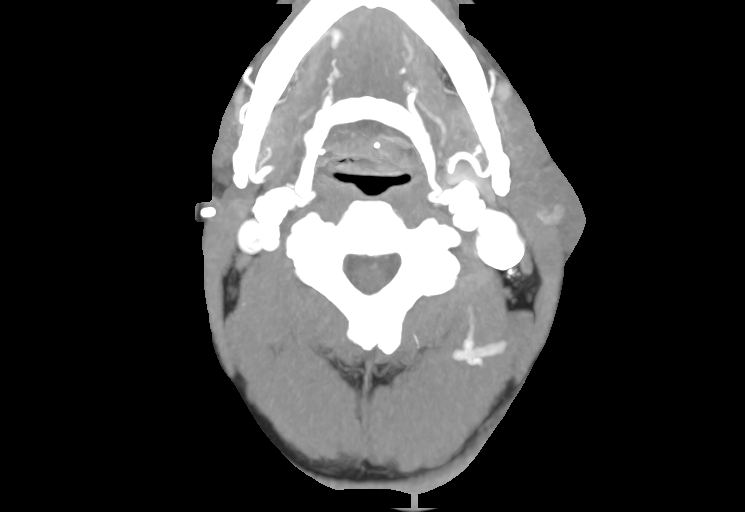

[9 of 32 positions shown; findings below may reference images not displayed]

FINDINGS: Skeleton: There is no bony spinal canal stenosis. No lytic or
blastic lesion.

Other neck: Normal pharynx, larynx and major salivary glands. No
cervical lymphadenopathy. Unremarkable thyroid gland.

Upper chest: No pneumothorax or pleural effusion. No nodules or
masses.

Aortic arch: There is mild calcific atherosclerosis of the aortic
arch. There is no aneurysm, dissection or hemodynamically
significant stenosis of the visualized ascending aorta and aortic
arch. Conventional 3 vessel aortic branching pattern. The visualized
proximal subclavian arteries are widely patent.

Right carotid system:

--Common carotid artery: Widely patent origin without common carotid
artery dissection or aneurysm.

--Internal carotid artery: No dissection, occlusion or aneurysm.
Mild atherosclerotic calcification at the carotid bifurcation
without hemodynamically significant stenosis.

--External carotid artery: No acute abnormality.

Left carotid system:

--Common carotid artery: Widely patent origin without common carotid
artery dissection or aneurysm.

--Internal carotid artery:No dissection, occlusion or aneurysm. Mild
atherosclerotic calcification at the carotid bifurcation without
hemodynamically significant stenosis.

--External carotid artery: No acute abnormality.

Vertebral arteries: Right dominant configuration. Both origins are
normal. No dissection, occlusion or flow-limiting stenosis to the
vertebrobasilar confluence.

Review of the MIP images confirms the above findings
IMPRESSION: 1. No emergent large vessel occlusion or hemodynamically significant
stenosis of the carotid or vertebral arteries.

Aortic Atherosclerosis ([5X]-[5X]).

## 2021-04-14 MED ORDER — AMLODIPINE BESYLATE 5 MG PO TABS: 2.5000 mg | ORAL_TABLET | Freq: Every day | ORAL | 3 refills | Status: DC

## 2021-04-14 MED ORDER — IOPAMIDOL (ISOVUE-370) INJECTION 76%
75.0000 mL | Freq: Once | INTRAVENOUS | Status: AC | PRN
  Administered 2021-04-14: 75 mL via INTRAVENOUS

## 2021-05-29 ENCOUNTER — Ambulatory Visit: Payer: Medicare Other | Admitting: Podiatry

## 2021-06-13 ENCOUNTER — Encounter: Payer: Self-pay | Admitting: Podiatry

## 2021-06-13 ENCOUNTER — Other Ambulatory Visit: Payer: Self-pay

## 2021-06-13 ENCOUNTER — Ambulatory Visit (INDEPENDENT_AMBULATORY_CARE_PROVIDER_SITE_OTHER): Payer: Medicare Other | Admitting: Podiatry

## 2021-06-13 DIAGNOSIS — M76822 Posterior tibial tendinitis, left leg: Secondary | ICD-10-CM

## 2021-06-13 MED ORDER — BETAMETHASONE SOD PHOS & ACET 6 (3-3) MG/ML IJ SUSP
3.0000 mg | Freq: Once | INTRAMUSCULAR | Status: AC
Start: 2021-06-13 — End: ?

## 2021-06-13 NOTE — Progress Notes (Signed)
° ° °  HPI: 71 y.o. male presenting today for an acute flareup of left medial ankle pain that has been present for about 2 weeks now.  Patient states that he had sudden onset of left medial ankle pain and he went to [facility].  They prescribed him a prednisone pack.  He is currently on anticoagulants secondary to stroke.  He also wore a immobilization cam boot for 1 week.  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  Dermatology: Skin is warm, dry and supple bilateral lower extremities. Negative for open lesions or macerations.  Vascular: Palpable pedal pulses bilaterally. No edema or erythema noted. Capillary refill within normal limits.  Neurological: Epicritic and protective threshold grossly intact bilaterally.   Musculoskeletal Exam: Pain on palpation noted along posterior tibial tendon of the left lower extremity as it inserts onto the navicular tuberosity. Range of motion within normal limits. Muscle strength 5/5 in all muscle groups bilateral lower extremities.  Radiographic Exam taken at [facility] 05/29/2021:  Normal osseous mineralization. Joint spaces preserved.  Irregularity of the navicular with accessory navicular noted on lateral and AP view.  X-rays were reviewed personally by me today on CD  Assessment: 1. Posterior tibial tendinitis left 2.  Accessory navicular left   Plan of Care:  1. Patient was evaluated. Radiographs were reviewed today. 2. Injection of 0.5 mL Celestone Soluspan  injected into the posterior tibial tendon sheath.  3.  Patient completed his Medrol Dosepak 4.  Continue wearing ankle brace daily 5.  Continue Tylenol as needed 6.  Return to clinic as needed   Edrick Kins, DPM [facility]  Dr. Edrick Kins, DPM    [address], [place] [address]                Office [phone]  Fax [phone]

## 2021-06-23 ENCOUNTER — Encounter: Payer: Self-pay | Admitting: Cardiology

## 2021-06-23 MED ORDER — AMOXICILLIN 500 MG PO CAPS: 500.0000 mg | ORAL_CAPSULE | Freq: Once | ORAL | 3 refills | Status: AC

## 2021-07-24 ENCOUNTER — Other Ambulatory Visit: Payer: Self-pay

## 2021-07-24 ENCOUNTER — Ambulatory Visit (INDEPENDENT_AMBULATORY_CARE_PROVIDER_SITE_OTHER): Payer: Medicare Other | Admitting: Cardiology

## 2021-07-24 ENCOUNTER — Encounter: Payer: Self-pay | Admitting: Cardiology

## 2021-07-24 DIAGNOSIS — I10 Essential (primary) hypertension: Secondary | ICD-10-CM

## 2021-07-24 DIAGNOSIS — Z951 Presence of aortocoronary bypass graft: Secondary | ICD-10-CM | POA: Diagnosis not present

## 2021-07-24 DIAGNOSIS — I2510 Atherosclerotic heart disease of native coronary artery without angina pectoris: Secondary | ICD-10-CM

## 2021-07-24 DIAGNOSIS — E782 Mixed hyperlipidemia: Secondary | ICD-10-CM

## 2021-07-24 DIAGNOSIS — Z953 Presence of xenogenic heart valve: Secondary | ICD-10-CM | POA: Diagnosis not present

## 2021-07-24 DIAGNOSIS — I6330 Cerebral infarction due to thrombosis of unspecified cerebral artery: Secondary | ICD-10-CM

## 2021-07-24 NOTE — Progress Notes (Signed)
Cardiology Office Note:    Date:  07/24/2021   ID:  Alan Mcdonald, DOB 03/23/1951, MRN [id number]  PCP:  Zachery Dauer, MD   [facility] Providers Cardiologist:  Candee Furbish, MD     Referring MD: [facility]    History of Present Illness:    Alan Mcdonald is a 71 y.o. male here for the follow-up of CAD post CABG with aortic valve replacement September 2021 who was admitted with stroke in the fall 2022.  Acute ischemic stroke involving the left lentiform and caudate nucleus.  Was placed on Plavix 75 as well as increased aspirin 162.  Echocardiogram normal ejection fraction 65% with no other abnormalities.  On statin.  Previously had critical left main coronary artery disease and moderate aortic stenosis.  Previously went down to [place] for a large wedding.  He is from there.  His wife is as well.  She just had lumbar spine surgery.  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   LIMA to LAD SVG to OM    Past Surgical History:  Procedure Laterality Date   AORTIC VALVE REPLACEMENT N/A 02/22/2020   Procedure: AORTIC VALVE REPLACEMENT (AVR) USING EDWARDS Resilia 25 MM AORTIC VALVE.;  Surgeon: Rexene Alberts, MD;  Location: [place];  Service: Open Heart Surgery;  Laterality: N/A;   CARDIAC CATHETERIZATION     CERVICAL SPINE SURGERY     CHOLECYSTECTOMY     CORONARY ARTERY BYPASS GRAFT N/A 02/22/2020   Procedure: CORONARY ARTERY BYPASS GRAFTING (CABG) USING LIMA to LAD; ENDSCOPICALLY HARVEST RIGHT GREATER SAPHENOUS VEIN: SVG to OM1;  Surgeon: Rexene Alberts, MD;  Location: [place];  Service: Open Heart Surgery;  Laterality: N/A;   ELBOW SURGERY     ENDOVEIN HARVEST OF GREATER SAPHENOUS VEIN  Right 02/22/2020   Procedure: ENDOVEIN HARVEST OF GREATER SAPHENOUS VEIN;  Surgeon: Rexene Alberts, MD;  Location: [place];  Service: Open Heart Surgery;  Laterality: Right;   KIDNEY STONE SURGERY     LEFT HEART CATH AND CORONARY ANGIOGRAPHY N/A 02/22/2020   Procedure: LEFT HEART CATH AND CORONARY ANGIOGRAPHY;  Surgeon: Belva Crome, MD;  Location: [place] [facility];  Service: Cardiovascular;  Laterality: N/A;   TEE WITHOUT CARDIOVERSION N/A 02/22/2020   Procedure: TRANSESOPHAGEAL ECHOCARDIOGRAM (TEE);  Surgeon: Rexene Alberts, MD;  Location: [place];  Service: Open Heart Surgery;  Laterality: N/A;    Current Medications: Current Meds  Medication Sig   acetaminophen (TYLENOL) 500 MG tablet Take 500 mg by mouth every 6 (six) hours as needed for mild pain or moderate pain.   amLODipine (NORVASC) 5 MG tablet Take 0.5 tablets (2.5 mg total) by mouth daily.   atorvastatin (LIPITOR) 80 MG tablet Take 1 tablet (80 mg total) by mouth every evening.   B Complex Vitamins (VITAMIN B-COMPLEX) TABS Take 1 tablet by mouth daily.    carvedilol (COREG) 3.125 MG tablet Take 1 tablet (3.125 mg total) by mouth 2 (two) times daily with a meal.   cholecalciferol (VITAMIN D3) 25 MCG (1000 UNIT) tablet Take 1,000 Units by mouth daily.   clopidogrel (PLAVIX) 75 MG tablet Take 75 mg by mouth daily.  Coenzyme Q10 (CO Q-10) 200 MG CAPS Take 1 tablet by mouth daily.   glucosamine-chondroitin 500-400 MG tablet Take 1 tablet by mouth daily.   losartan (COZAAR) 100 MG tablet Take 1 tablet (100 mg total) by mouth daily.   Multiple Vitamin (MULTIVITAMIN WITH MINERALS) TABS tablet Take 1 tablet by mouth daily.   RA KRILL OIL 500 MG CAPS Take 500 mg by mouth daily.   Turmeric 500 MG CAPS Take 500 mg by mouth daily.   Zinc 30 MG CAPS Take 30 mg by mouth daily.   Current Facility-Administered Medications for the 07/24/21 encounter (Office Visit) with Jerline Pain, MD  Medication   betamethasone acetate-betamethasone  sodium phosphate (CELESTONE) injection 3 mg     Allergies:   Patient has no known allergies.   Social History   Socioeconomic History   Marital status: Married    Spouse name: Not on file   Number of children: Not on file   Years of education: 14   Highest education level: Associate degree: occupational, Hotel manager, or vocational program  Occupational History   Occupation: semi retired  Tobacco Use   Smoking status: Former    Packs/day: 0.50    Years: 25.00    Pack years: 12.50    Types: Cigarettes    Quit date: 2003    Years since quitting: 20.1   Smokeless tobacco: Never  Vaping Use   Vaping Use: Never used  Substance and Sexual Activity   Alcohol use: Yes    Comment: occasional   Drug use: Never   Sexual activity: Not on file  Other Topics Concern   Not on file  Social History Narrative   Not on file   Social Determinants of Health   Financial Resource Strain: Not on file  Food Insecurity: Not on file  Transportation Needs: Not on file  Physical Activity: Not on file  Stress: Not on file  Social Connections: Not on file     Family History: The patient's  Younger brother died of heart disease. Quit smoking 20 years ago.   ROS:   Please see the history of present illness.     All other systems reviewed and are negative.  EKGs/Labs/Other Studies Reviewed:     Recent Labs: 02/17/2021: ALT 18; BUN 10; Creatinine, Ser 0.70; Hemoglobin 12.6; Platelets 98; Potassium 3.6; Sodium 142  Recent Lipid Panel    Component Value Date/Time   CHOL 118 04/05/2021 1026   TRIG 79 04/05/2021 1026   HDL 38 (L) 04/05/2021 1026   CHOLHDL 3.1 04/05/2021 1026   CHOLHDL 4.1 02/22/2020 0208   VLDL 30 02/22/2020 0208   LDLCALC 64 04/05/2021 1026     Risk Assessment/Calculations:              Physical Exam:    VS:  BP 130/70 (BP Location: Left Arm, Patient Position: Sitting, Cuff Size: Normal)    Pulse (!) 54    Ht 5\' 2"  (1.575 m)    Wt 151 lb (68.5 kg)    SpO2 98%     BMI 27.62 kg/m     Wt Readings from Last 3 Encounters:  07/24/21 151 lb (68.5 kg)  04/05/21 155 lb (70.3 kg)  02/17/21 155 lb (70.3 kg)     GEN:  Well nourished, well developed in no acute distress HEENT: Normal NECK: No JVD; No carotid bruits LYMPHATICS: No lymphadenopathy CARDIAC: RRR, no murmurs, no rubs, gallops RESPIRATORY:  Clear to auscultation without rales, wheezing or rhonchi  ABDOMEN:  Soft, non-tender, non-distended MUSCULOSKELETAL:  No edema; No deformity  SKIN: Warm and dry NEUROLOGIC:  Alert and oriented x 3 PSYCHIATRIC:  Normal affect   ASSESSMENT:    1. Coronary artery disease involving native coronary artery of native heart without angina pectoris   2. Cerebral thrombosis with cerebral infarction   3. S/P aortic valve replacement with bioprosthetic valve   4. S/P CABG x 2   5. Mixed hyperlipidemia   6. Essential (primary) hypertension    PLAN:    In order of problems listed above:  Coronary artery disease Overall doing very well without any anginal symptoms.  Continue with goal-directed medical therapy.  Has previously been on both aspirin and Plavix.  Plavix was reinitiated after stroke.  Cerebral thrombosis with cerebral infarction Neurology notes reviewed.  Plavix, aspirin. Small vessel. Had trouble filling out flu shot paper work. Next morning could hardly write. Went to [facility] [employer] [facility] [employer]. Brought in. No bleeding.   S/P aortic valve replacement with bioprosthetic valve Edwards bovine pericardial tissue valve 25 mm.  Dental antibiotics.  Doing well.  Recent echocardiogram reviewed.  Stable.  S/P CABG x 2 LIMA to LAD SVG to OM.  Stable.  Hyperlipidemia Currently on atorvastatin 80 mg a day.  Last LDL 64 in October 2022.  Excellent.  Hemoglobin A1c 5.1 hemoglobin 12.6 creatinine 0.7.  Outside labs reviewed.  Essential (primary) hypertension Currently taking low-dose amlodipine 2.5 mg a day as well as carvedilol 3.125 mg twice a day.  On losartan 100 mg a  day.  Had previously messaged with lower blood pressures on 5 mg of amlodipine.  This is why he is on the low-dose 2.5. Lost 10 pounds. Working out.      Medication Adjustments/Labs and Tests Ordered: Current medicines are reviewed at length with the patient today.  Concerns regarding medicines are outlined above.  No orders of the defined types were placed in this encounter.  No orders of the defined types were placed in this encounter.   Patient Instructions  Medication Instructions:  The current medical regimen is effective;  continue present plan and medications.  *If you need a refill on your cardiac medications before your next appointment, please call your pharmacy*  Follow-Up: At [facility], you and your health needs are our priority.  As part of our continuing mission to provide you with exceptional heart care, we have created designated Provider Care Teams.  These Care Teams include your primary Cardiologist (physician) and Advanced Practice Providers (APPs -  Physician Assistants and Nurse Practitioners) who all work together to provide you with the care you need, when you need it.  We recommend signing up for the patient portal called "MyChart".  Sign up information is provided on this After Visit Summary.  MyChart is used to connect with patients for Virtual Visits (Telemedicine).  Patients are able to view lab/test results, encounter notes, upcoming appointments, etc.  Non-urgent messages can be sent to your provider as well.   To learn more about what you can do with MyChart, go to [email or web address].    Your next appointment:   1 year(s)  The format for your next appointment:   In Person  Provider:   Candee Furbish, MD     Thank you for choosing [facility]!!      Signed, Candee Furbish, MD  07/24/2021 9:11 AM    [place]

## 2021-07-24 NOTE — Assessment & Plan Note (Signed)
Edwards bovine pericardial tissue valve 25 mm.  Dental antibiotics.  Doing well.  Recent echocardiogram reviewed.  Stable.

## 2021-07-24 NOTE — Assessment & Plan Note (Signed)
Overall doing very well without any anginal symptoms.  Continue with goal-directed medical therapy.  Has previously been on both aspirin and Plavix.  Plavix was reinitiated after stroke.

## 2021-07-24 NOTE — Patient Instructions (Signed)
Medication Instructions:  The current medical regimen is effective;  continue present plan and medications.  *If you need a refill on your cardiac medications before your next appointment, please call your pharmacy*  Follow-Up: At [facility], you and your health needs are our priority.  As part of our continuing mission to provide you with exceptional heart care, we have created designated Provider Care Teams.  These Care Teams include your primary Cardiologist (physician) and Advanced Practice Providers (APPs -  Physician Assistants and Nurse Practitioners) who all work together to provide you with the care you need, when you need it.  We recommend signing up for the patient portal called "MyChart".  Sign up information is provided on this After Visit Summary.  MyChart is used to connect with patients for Virtual Visits (Telemedicine).  Patients are able to view lab/test results, encounter notes, upcoming appointments, etc.  Non-urgent messages can be sent to your provider as well.   To learn more about what you can do with MyChart, go to [email or web address].    Your next appointment:   1 year(s)  The format for your next appointment:   In Person  Provider:   Mark Skains, MD   Thank you for choosing [facility]!!    

## 2021-07-24 NOTE — Assessment & Plan Note (Signed)
Currently on atorvastatin 80 mg a day.  Last LDL 64 in October 2022.  Excellent.  Hemoglobin A1c 5.1 hemoglobin 12.6 creatinine 0.7.  Outside labs reviewed.

## 2021-07-24 NOTE — Assessment & Plan Note (Addendum)
Currently taking low-dose amlodipine 2.5 mg a day as well as carvedilol 3.125 mg twice a day.  On losartan 100 mg a day.  Had previously messaged with lower blood pressures on 5 mg of amlodipine.  This is why he is on the low-dose 2.5. Lost 10 pounds. Working out.

## 2021-07-24 NOTE — Assessment & Plan Note (Addendum)
Neurology notes reviewed.  Plavix, aspirin. Small vessel. Had trouble filling out flu shot paper work. Next morning could hardly write. Went to [facility] [employer] [facility] [employer]. Brought in. No bleeding.

## 2021-07-24 NOTE — Assessment & Plan Note (Signed)
LIMA to LAD SVG to OM.  Stable.

## 2021-09-21 ENCOUNTER — Telehealth: Payer: Self-pay | Admitting: Neurology

## 2021-09-21 NOTE — Telephone Encounter (Signed)
Raquel Sarna, Landa with [facility] is asking for a 5 day hold on pt's 5 day hold on clopidogrel (PLAVIX) 75 MG tablet due to a EGD and a colonoscopy scheduled for 06-08.  The call back # for Nursing line is [phone] xt [phone] ?

## 2021-09-25 NOTE — Telephone Encounter (Signed)
Spoke with Alan Mcdonald, ok to hold Plavix for a total of 5 days for up coming EGD and Colonoscopy.

## 2021-09-27 ENCOUNTER — Encounter: Payer: Self-pay | Admitting: Neurology

## 2021-09-27 ENCOUNTER — Ambulatory Visit (INDEPENDENT_AMBULATORY_CARE_PROVIDER_SITE_OTHER): Payer: Medicare Other | Admitting: Neurology

## 2021-09-27 VITALS — BP 160/72 | HR 51 | Ht 62.0 in | Wt 150.0 lb

## 2021-09-27 DIAGNOSIS — I6330 Cerebral infarction due to thrombosis of unspecified cerebral artery: Secondary | ICD-10-CM | POA: Diagnosis not present

## 2021-09-27 DIAGNOSIS — I10 Essential (primary) hypertension: Secondary | ICD-10-CM | POA: Diagnosis not present

## 2021-09-27 NOTE — Progress Notes (Signed)
? ?[facility] ? ?PATIENT: Alan Mcdonald ?DOB: 24-Jan-1951 ? ?REFERRING CLINICIAN: Tapp, Dannielle Burn, MD ?HISTORY FROM: Patient and spouse  ?REASON FOR VISIT: Stroke  ? ? ?HISTORICAL ? ?CHIEF COMPLAINT:  ?Chief Complaint  ?Patient presents with  ? Follow-up  ?  Rm 12. Accompanied by wife. ?Pt states BP has been elevated lately. On two 81 mg aspirin daily, remains on Plavix.  ? ? ?INTERVAL HISTORY 09/27/2021:  ?Patient presents today for follow-up, he is accompanied by his wife, he is doing well since last visit, he is compliant with his medications and reports only issues that sometimes his blood pressure run high.  He is currently on Norvasc 2.5 mg daily, Coreg 3.125 mg twice daily and losartan 100 mg daily.  Denies any strokelike symptoms, no falls, no other question or concern.  He was recently diagnosed with iron deficiency anemia, started on iron supplement and is scheduled for a EGD and colonoscopy in June at the [place].  At last visit, I will check his lipid panel, showing an LDL of 64 and his CTA neck was completed showing no large vessel occlusion. ? ?HISTORY OF PRESENT ILLNESS:  ?This is a 71 year old gentleman with past medical history of hypertension, hyperlipidemia, CAD status post CABG and aortic valve replacement last September 2021 previously on aspirin 81 mg and Plavix 75 mg for total of 1 year who was recently admitted in the hospital for acute stroke.  Patient presented to the hospital after period of confusion, feeling cloudy, slurred speech, and difficulty writing.  He was found to have a acute ischemic stroke involving the left lentiform and caudate nucleus.  He did have the whole stroke work-up including MRI, MRA of the head and echocardiogram, a MRA or CTA of the neck was not performed.  He was seen with PT, did well and was discharged without any further outpatient physical therapy.  Plan at discharge was to continue Plavix 75 mg and to increase aspirin to 162 mg.  Since discharge  he reported he is doing much better he continue on aspirin 162 mg and and Plavix 75 mg, no additional complaints.  He continues to exercise.  ? ?OTHER MEDICAL CONDITIONS: Hypertension, hyperlipidemia, CAD status post CABG and aortic valve replacement last September 2021 on aspirin and Plavix, left acoustic neuroma ? ? ?REVIEW OF SYSTEMS: Full 14 system review of systems performed and negative with exception of: As noted in the HPI ? ?ALLERGIES: ?No Known Allergies ? ?HOME MEDICATIONS: ?Outpatient Medications Prior to Visit  ?Medication Sig Dispense Refill  ? acetaminophen (TYLENOL) 500 MG tablet Take 500 mg by mouth every 6 (six) hours as needed for mild pain or moderate pain.    ? amLODipine (NORVASC) 5 MG tablet Take 0.5 tablets (2.5 mg total) by mouth daily. 45 tablet 3  ? aspirin EC 81 MG tablet Take 81 mg by mouth 2 (two) times daily. Swallow whole.    ? atorvastatin (LIPITOR) 80 MG tablet Take 1 tablet (80 mg total) by mouth every evening. 30 tablet 1  ? B Complex Vitamins (VITAMIN B-COMPLEX) TABS Take 1 tablet by mouth daily.     ? carvedilol (COREG) 3.125 MG tablet Take 1 tablet (3.125 mg total) by mouth 2 (two) times daily with a meal. (Patient taking differently: Take 6.25 mg by mouth. Split pill - 1/2 in the morning, 1/2 in the afternoon) 60 tablet 1  ? cholecalciferol (VITAMIN D3) 25 MCG (1000 UNIT) tablet Take 1,000 Units by mouth daily.    ?  clopidogrel (PLAVIX) 75 MG tablet Take 75 mg by mouth daily.    ? Coenzyme Q10 (CO Q-10) 200 MG CAPS Take 1 tablet by mouth daily.    ? ferrous sulfate 325 (65 FE) MG tablet Take 1 tablet by mouth 3 (three) times a week.    ? glucosamine-chondroitin 500-400 MG tablet Take 1 tablet by mouth daily.    ? losartan (COZAAR) 100 MG tablet Take 1 tablet (100 mg total) by mouth daily. 90 tablet 3  ? Multiple Vitamin (MULTIVITAMIN WITH MINERALS) TABS tablet Take 1 tablet by mouth daily.    ? RA KRILL OIL 500 MG CAPS Take 500 mg by mouth daily.    ? Turmeric 500 MG CAPS Take  500 mg by mouth daily.    ? Zinc 30 MG CAPS Take 30 mg by mouth daily.    ? ?Facility-Administered Medications Prior to Visit  ?Medication Dose Route Frequency Provider Last Rate Last Admin  ? betamethasone acetate-betamethasone sodium phosphate (CELESTONE) injection 3 mg  3 mg Intra-articular Once Edrick Kins, DPM      ? ? ?PAST MEDICAL HISTORY: ?Past Medical History:  ?Diagnosis Date  ? Aortic stenosis 02/22/2020  ? Coronary artery disease   ? High cholesterol   ? History of lymphoma   ? Hypertension   ? S/P aortic valve replacement with bioprosthetic valve 02/22/2020  ? Edwards Inspiris Resilia stented bovine pericardial tissue valve, size 25 mm  ? 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  ? LIMA to LAD SVG to OM  ? ? ?PAST SURGICAL HISTORY: ?Past Surgical History:  ?Procedure Laterality Date  ? AORTIC VALVE REPLACEMENT N/A 02/22/2020  ? Procedure: AORTIC VALVE REPLACEMENT (AVR) USING EDWARDS Resilia 25 MM AORTIC VALVE.;  Surgeon: Rexene Alberts, MD;  Location: [place];  Service: Open Heart Surgery;  Laterality: N/A;  ? CARDIAC CATHETERIZATION    ? CERVICAL SPINE SURGERY    ? CHOLECYSTECTOMY    ? CORONARY ARTERY BYPASS GRAFT N/A 02/22/2020  ? Procedure: CORONARY ARTERY BYPASS GRAFTING (CABG) USING LIMA to LAD; ENDSCOPICALLY HARVEST RIGHT GREATER SAPHENOUS VEIN: SVG to OM1;  Surgeon: Rexene Alberts, MD;  Location: [place];  Service: Open Heart Surgery;  Laterality: N/A;  ? ELBOW SURGERY    ? ENDOVEIN HARVEST OF GREATER SAPHENOUS VEIN Right 02/22/2020  ? Procedure: ENDOVEIN HARVEST OF GREATER SAPHENOUS VEIN;  Surgeon: Rexene Alberts, MD;  Location: [place];  Service: Open Heart Surgery;  Laterality: Right;  ? KIDNEY STONE SURGERY    ? LEFT HEART CATH AND CORONARY ANGIOGRAPHY N/A 02/22/2020  ? Procedure: LEFT HEART CATH AND CORONARY ANGIOGRAPHY;  Surgeon: Belva Crome, MD;  Location: [place] [facility];  Service: Cardiovascular;  Laterality: N/A;  ? TEE WITHOUT CARDIOVERSION N/A 02/22/2020  ? Procedure: TRANSESOPHAGEAL ECHOCARDIOGRAM  (TEE);  Surgeon: Rexene Alberts, MD;  Location: [place];  Service: Open Heart Surgery;  Laterality: N/A;  ? ? ?FAMILY HISTORY: ?History reviewed. No pertinent family history. ? ?SOCIAL HISTORY: ?Social History  ? ?Socioeconomic History  ? Marital status: Married  ?  Spouse name: Not on file  ? Number of children: Not on file  ? Years of education: 69  ? Highest education level: Associate degree: occupational, Hotel manager, or vocational program  ?Occupational History  ? Occupation: semi retired  ?Tobacco Use  ? Smoking status: Former  ?  Packs/day: 0.50  ?  Years: 25.00  ?  Pack years: 12.50  ?  Types: Cigarettes  ?  Quit date: 2003  ?  Years since quitting: 20.3  ? Smokeless tobacco: Never  ?Vaping Use  ? Vaping Use: Never used  ?Substance and Sexual Activity  ? Alcohol use: Yes  ?  Comment: occasional  ? Drug use: Never  ? Sexual activity: Not on file  ?Other Topics Concern  ? Not on file  ?Social History Narrative  ? Not on file  ? ?Social Determinants of Health  ? ?Financial Resource Strain: Not on file  ?Food Insecurity: Not on file  ?Transportation Needs: Not on file  ?Physical Activity: Not on file  ?Stress: Not on file  ?Social Connections: Not on file  ?Intimate Partner Violence: Not on file  ? ? ? ?PHYSICAL EXAM ? ?GENERAL EXAM/CONSTITUTIONAL: ?Vitals:  ?Vitals:  ? 09/27/21 0910  ?BP: (!) 160/72  ?Pulse: (!) 51  ?Weight: 150 lb (68 kg)  ?Height: '5\' 2"'$  (1.575 m)  ? ? ?Body mass index is 27.44 kg/m?. ?Wt Readings from Last 3 Encounters:  ?09/27/21 150 lb (68 kg)  ?07/24/21 151 lb (68.5 kg)  ?04/05/21 155 lb (70.3 kg)  ? ?Patient is in no distress; well developed, nourished and groomed; neck is supple ? ?EYES: ?Pupils round and reactive to light, Visual fields full to confrontation, Extraocular movements intacts,  ? ?MUSCULOSKELETAL: ?Gait, strength, tone, movements noted in Neurologic exam below ? ?NEUROLOGIC: ?MENTAL STATUS:  ?   ? View : No data to display.  ?  ?  ?  ? ?awake, alert, oriented to person, place  and time ?recent and remote memory intact ?normal attention and concentration ?language fluent, comprehension intact, naming intact ?fund of knowledge appropriate ? ?CRANIAL NERVE:  ?2nd, 3rd, 4th, 6t

## 2021-09-27 NOTE — Patient Instructions (Signed)
Continue current medications ?Blood pressure goal less than 891 systolic ?Follow-up with primary care doctor ?Return in 1 year ?

## 2021-10-04 ENCOUNTER — Ambulatory Visit: Payer: Medicare Other | Admitting: Neurology

## 2021-12-29 ENCOUNTER — Encounter: Payer: Self-pay | Admitting: Cardiology

## 2021-12-29 MED ORDER — LOSARTAN POTASSIUM 100 MG PO TABS: 100.0000 mg | ORAL_TABLET | Freq: Every day | ORAL | 1 refills | Status: AC

## 2022-01-16 ENCOUNTER — Telehealth: Payer: Self-pay

## 2022-01-16 NOTE — Telephone Encounter (Signed)
   Pre-operative Risk Assessment    Patient Name: Alan Mcdonald  DOB: Aug 16, 1950 MRN: [id number]      Request for Surgical Clearance    Procedure:   LUMBAR SPINE - LEFT- L4-L5  Date of Surgery:  Clearance 01/22/22                                 Surgeon:  DR. DAVE [facility] Surgeon's Group or Practice Name:  [facility] Phone number:  [phone] Fax number:  [phone]   Type of Clearance Requested:   - Pharmacy:  Hold Aspirin and Clopidogrel (Plavix) 7 DAYS PRIOR AND 1 DAY AFTER   Type of Anesthesia:  Not Indicated   Additional requests/questions:    SignedJacinta Shoe   01/16/2022, 1:06 PM

## 2022-01-16 NOTE — Telephone Encounter (Signed)
Primary Cardiologist:Mark Marlou Porch, MD  Chart reviewed as part of pre-operative protocol coverage. Alan Mcdonald's past medical history indicates that he is currently on aspirin 162 mg daily and Plavix due to CVA 02/2021. He does have a history of CABG for which he previously took aspirin 81 mg.   Forwarding note to requesting provider. If cardiac medical clearance is requested, you will need to notify us so that we can arrange a phone call appointment for further preoperative risk assessment.   Removing request from the preop pool.   Emmaline Life, NP-C    01/16/2022, 1:29 PM [place] [address], [address] Office [phone] Fax [phone]

## 2022-01-17 ENCOUNTER — Telehealth: Payer: Self-pay | Admitting: *Deleted

## 2022-01-17 NOTE — Telephone Encounter (Signed)
S/w the pt and his wife. Pt's wife tells me they have postponed procedure , as the requesting office has been having trouble getting the clearance request to doctors the pt see's at the [place] as well. We have proceeded on cardiology side still and have scheduled a tele pre op appt 01/23/22 @ 9 am. Both the pt and his wife thanked me for the help. Med rec and consent are done.

## 2022-01-17 NOTE — Telephone Encounter (Signed)
S/w the pt and his wife. Pt's wife tells me they have postponed procedure , as the requesting office has been having trouble getting the clearance request to doctors the pt see's at the [place] as well. We have proceeded on cardiology side still and have scheduled a tele pre op appt 01/23/22 @ 9 am. Both the pt and his wife thanked me for the help. Med rec and consent are done.      Patient Consent for Virtual Visit        Alan Mcdonald has provided verbal consent on 01/17/2022 for a virtual visit (video or telephone).   CONSENT FOR VIRTUAL VISIT FOR:  Alan Mcdonald  By participating in this virtual visit I agree to the following:  I hereby voluntarily request, consent and authorize [facility] and its employed or contracted physicians, physician assistants, nurse practitioners or other licensed health care professionals (the Practitioner), to provide me with telemedicine health care services (the "Services") as deemed necessary by the treating Practitioner. I acknowledge and consent to receive the Services by the Practitioner via telemedicine. I understand that the telemedicine visit will involve communicating with the Practitioner through live audiovisual communication technology and the disclosure of certain medical information by electronic transmission. I acknowledge that I have been given the opportunity to request an in-person assessment or other available alternative prior to the telemedicine visit and am voluntarily participating in the telemedicine visit.  I understand that I have the right to withhold or withdraw my consent to the use of telemedicine in the course of my care at any time, without affecting my right to future care or treatment, and that the Practitioner or I may terminate the telemedicine visit at any time. I understand that I have the right to inspect all information obtained and/or recorded in the course of the telemedicine visit and may receive copies of available information for  a reasonable fee.  I understand that some of the potential risks of receiving the Services via telemedicine include:  Delay or interruption in medical evaluation due to technological equipment failure or disruption; Information transmitted may not be sufficient (e.g. poor resolution of images) to allow for appropriate medical decision making by the Practitioner; and/or  In rare instances, security protocols could fail, causing a breach of personal health information.  Furthermore, I acknowledge that it is my responsibility to provide information about my medical history, conditions and care that is complete and accurate to the best of my ability. I acknowledge that Practitioner's advice, recommendations, and/or decision may be based on factors not within their control, such as incomplete or inaccurate data provided by me or distortions of diagnostic images or specimens that may result from electronic transmissions. I understand that the practice of medicine is not an exact science and that Practitioner makes no warranties or guarantees regarding treatment outcomes. I acknowledge that a copy of this consent can be made available to me via my patient portal ([facility]), or I can request a printed copy by calling the office of [facility].    I understand that my insurance will be billed for this visit.   I have read or had this consent read to me. I understand the contents of this consent, which adequately explains the benefits and risks of the Services being provided via telemedicine.  I have been provided ample opportunity to ask questions regarding this consent and the Services and have had my questions answered to my satisfaction. I give my informed consent for the  services to be provided through the use of telemedicine in my medical care

## 2022-01-17 NOTE — Telephone Encounter (Signed)
I will also send an FYI to requesting office of my conversation today with the pt and his wife. Clearance request sent to [place] today stating 2nd attempts. The clearance request only sent to our office yesterday 01/16/22.[facility] prides themself in being able to provide unremarkable care for our pt's. When it comes to needing a surgery/procedure we do all that we can to provide the clearance to the surgeon with the informed decision and recommendations from the cardiologist in a timely manner. We again only received the request yesterday. Please see my notes from today with the pt and his wife.

## 2022-01-23 ENCOUNTER — Ambulatory Visit (INDEPENDENT_AMBULATORY_CARE_PROVIDER_SITE_OTHER): Payer: Medicare Other | Admitting: Nurse Practitioner

## 2022-01-23 DIAGNOSIS — Z01810 Encounter for preprocedural cardiovascular examination: Secondary | ICD-10-CM | POA: Diagnosis not present

## 2022-01-23 NOTE — Progress Notes (Signed)
Virtual Visit via Telephone Note   Because of [name]'s co-morbid illnesses, he is at least at moderate risk for complications without adequate follow up.  This format is felt to be most appropriate for this patient at this time.  The patient did not have access to video technology/had technical difficulties with video requiring transitioning to audio format only (telephone).  All issues noted in this document were discussed and addressed.  No physical exam could be performed with this format.  Please refer to the patient's chart for his consent to telehealth for [facility].  Evaluation Performed:  Preoperative cardiovascular risk assessment _____________   Date:  01/23/2022   Patient ID:  Alan Mcdonald, DOB [birth year], MRN [id number] Patient Location:  Home Provider location:   Office  Primary Care Provider:  Zachery Dauer, MD Primary Cardiologist:  Candee Furbish, MD  Chief Complaint / Patient Profile   71 y.o. y/o male with a h/o CAD s/p CABG in 02/2020, aortic stenosis s/p AVR in 02/2020, CVA, hypertension, and hyperlipidemia, who is pending Lumbar spine-left-L4-L5 steroid injection with Dr. Lenord Carbo of [place] and presents today for telephonic preoperative cardiovascular risk assessment.  Past Medical History    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   LIMA to LAD SVG to OM   Past Surgical History:  Procedure Laterality Date   AORTIC VALVE REPLACEMENT N/A 02/22/2020   Procedure: AORTIC VALVE REPLACEMENT (AVR) USING EDWARDS Resilia 25 MM AORTIC VALVE.;  Surgeon: Rexene Alberts, MD;  Location: [place];  Service: Open Heart Surgery;  Laterality: N/A;    CARDIAC CATHETERIZATION     CERVICAL SPINE SURGERY     CHOLECYSTECTOMY     CORONARY ARTERY BYPASS GRAFT N/A 02/22/2020   Procedure: CORONARY ARTERY BYPASS GRAFTING (CABG) USING LIMA to LAD; ENDSCOPICALLY HARVEST RIGHT GREATER SAPHENOUS VEIN: SVG to OM1;  Surgeon: Rexene Alberts, MD;  Location: [place];  Service: Open Heart Surgery;  Laterality: N/A;   ELBOW SURGERY     ENDOVEIN HARVEST OF GREATER SAPHENOUS VEIN Right 02/22/2020   Procedure: ENDOVEIN HARVEST OF GREATER SAPHENOUS VEIN;  Surgeon: Rexene Alberts, MD;  Location: [place];  Service: Open Heart Surgery;  Laterality: Right;   KIDNEY STONE SURGERY     LEFT HEART CATH AND CORONARY ANGIOGRAPHY N/A 02/22/2020   Procedure: LEFT HEART CATH AND CORONARY ANGIOGRAPHY;  Surgeon: Belva Crome, MD;  Location: [place] [facility];  Service: Cardiovascular;  Laterality: N/A;   TEE WITHOUT CARDIOVERSION N/A 02/22/2020   Procedure: TRANSESOPHAGEAL ECHOCARDIOGRAM (TEE);  Surgeon: Rexene Alberts, MD;  Location: [place];  Service: Open Heart Surgery;  Laterality: N/A;    Allergies  No Known Allergies  History of Present Illness    Alan Mcdonald is a 71 y.o. male who presents via audio/video conferencing for a telehealth visit today.  Pt was last seen in cardiology clinic on 07/24/2021 by Dr. Marlou Porch.  At that time Alan Mcdonald was doing well.  The patient is now pending procedure as outlined above. Since his last visit, he has done well from a cardiac standpoint. He exercises regularly.  He denies chest pain, palpitations, dyspnea,  pnd, orthopnea, n, v, dizziness, syncope, edema, weight gain, or early satiety. All other systems reviewed and are otherwise negative except as noted above.   Home Medications    Prior to Admission medications   Medication Sig Start Date End Date Taking? Authorizing Provider  acetaminophen (TYLENOL) 500 MG tablet Take 500 mg by mouth every 6 (six) hours as needed for mild pain or moderate pain.    [provider]   amLODipine (NORVASC) 5 MG tablet Take 0.5 tablets (2.5 mg total) by mouth daily. 04/14/21   Jerline Pain, MD  aspirin EC 81 MG tablet Take 81 mg by mouth 2 (two) times daily. Swallow whole.    [provider]  atorvastatin (LIPITOR) 80 MG tablet Take 1 tablet (80 mg total) by mouth every evening. 02/26/20   Gold, Patrick Jupiter E, PA-C  B Complex Vitamins (VITAMIN B-COMPLEX) TABS Take 1 tablet by mouth daily.     [provider]  carvedilol (COREG) 3.125 MG tablet Take 1 tablet (3.125 mg total) by mouth 2 (two) times daily with a meal. Patient taking differently: Take 6.25 mg by mouth. Split pill - 1/2 in the morning, 1/2 in the afternoon 02/26/20   Gold, Wilder Glade, PA-C  cholecalciferol (VITAMIN D3) 25 MCG (1000 UNIT) tablet Take 1,000 Units by mouth daily.    [provider]  clopidogrel (PLAVIX) 75 MG tablet Take 75 mg by mouth daily.    [provider]  Coenzyme Q10 (CO Q-10) 200 MG CAPS Take 1 tablet by mouth daily.    [provider]  ferrous sulfate 325 (65 FE) MG tablet Take 1 tablet by mouth 3 (three) times a week. 09/13/21   [provider]  glucosamine-chondroitin 500-400 MG tablet Take 1 tablet by mouth daily.    [provider]  losartan (COZAAR) 100 MG tablet Take 1 tablet (100 mg total) by mouth daily. 12/29/21   Jerline Pain, MD  Multiple Vitamin (MULTIVITAMIN WITH MINERALS) TABS tablet Take 1 tablet by mouth daily.    [provider]  RA KRILL OIL 500 MG CAPS Take 500 mg by mouth daily.    [provider]  Turmeric 500 MG CAPS Take 500 mg by mouth daily.    [provider]  Zinc 30 MG CAPS Take 30 mg by mouth daily.    [provider]    Physical Exam    Vital Signs:  Alan Mcdonald does not have vital signs available for review today.  Given telephonic nature of communication, physical exam is limited. AAOx3. NAD. Normal affect.  Speech and respirations are unlabored.  Accessory Clinical  Findings    None  Assessment & Plan    1.  Preoperative Cardiovascular Risk Assessment:  According to the Revised Cardiac Risk Index (RCRI), his Perioperative Risk of Major Cardiac Event is (%): 6.6. His Functional Capacity in METs is: 8.97 according to the Duke Activity Status Index (DASI).Therefore, based on ACC/AHA guidelines, patient would be at acceptable risk for the planned procedure without further cardiovascular testing.   From a cardiac perspective, patient may hold aspirin for 7 days prior to procedure. However, patient takes aspirin and Plavix for a history of CVA. Therefore, final recommendations for holding aspirin and Plavix prior to surgery should come from neurology.  A copy of this note will be routed to requesting surgeon.  Time:   Today, I have spent 7 minutes with the patient with telehealth technology discussing medical history, symptoms, and management plan.  Lenna Sciara, NP  01/23/2022, 9:15 AM

## 2022-04-09 ENCOUNTER — Telehealth: Payer: Self-pay | Admitting: Cardiology

## 2022-04-09 ENCOUNTER — Telehealth: Payer: Self-pay | Admitting: *Deleted

## 2022-04-09 NOTE — Telephone Encounter (Signed)
Patient's wife is calling stating [facility] never received the clearance from patient's tele-visit on 08/15. Procedure was rescheduled for 11/09. She is requesting this be re-faxed for patient to hold his plavix in time. Please advise.

## 2022-04-09 NOTE — Telephone Encounter (Addendum)
Alan Mcdonald called and said they never received the clearance back in 01/2022 from our office. I stated that we had faxed the clearance notes from 01/23/22 Alan Browner, NP. I did ask Alan Mcdonald why did she wait so long to let us know never received 01/23/22 notes. Alan Mcdonald stated to me that the pt just called her.    I then called the pt and s/w his wife and she said she has had a hard time with Dr.Eichman office getting this done. I said let me s/w the pre op provider and let me see what we can do to take care of the pt.   I then s/w Alan Mcdonald, [facility] and she states she will do a regular phone appt, no charge as our office has already cleared the pt andf this is a re-do per Alan Mcdonald.    I then called the pt and his wife back and that Alan Mcdonald will call today at 11:30 and will fax notes to Dr. Davy Pique office. I will also old school fax notes as well. Pt and his wife are super grateful for our help in this matter.    Pre-operative Risk Assessment    Patient Name: Alan Mcdonald  DOB: [birth year] MRN: [id number]      Request for Surgical Clearance    Procedure:   LUMBAR SPINE-LEFT-L4-L5  INJECTION  Date of Surgery:  Clearance 04/19/22                                 Surgeon:  DR. DAVE [facility]  Surgeon's Group or Practice Name:  [facility] Phone number:  [phone] Fax number:  [phone]   Type of Clearance Requested:   - Medical  - Pharmacy:  Hold Clopidogrel (Plavix) x 7 DAYS PRIOR   Type of Anesthesia:  Not Indicated   Additional requests/questions:    Alan Mcdonald   04/09/2022, 9:23 AM

## 2022-04-09 NOTE — Telephone Encounter (Signed)
   Name: Alan Mcdonald  DOB: 08/07/1950  MRN: [id number]   Primary Cardiologist: Candee Furbish, MD  Chart reviewed as part of pre-operative protocol coverage. Patient was contacted 04/09/2022 in reference to pre-operative risk assessment for pending surgery as outlined below.  Alan Mcdonald was last seen on 07/24/2021 by Dr. Marlou Porch.  Since that day, Alan Mcdonald has done well.  He was seen last Thursday by one of the cardiology PAs at the [facility].  Was told he was doing well from a heart standpoint and was told to follow-up in a year.  He also has an appoint with Dr. Marlou Porch in February.  From an activity level little has changed from his conversation back in August with Diona Browner, NP.  He works out 5 days a week for about an hour including free weights, calisthenics, treadmill, and elliptical.  He scored well over 4 METS on the DASI.  He is on Plavix and aspirin.  History of CVA.  From a cardiac standpoint he is cleared to hold his aspirin x7 days prior to the procedure.  Please restart when medically safe to do so.  His Plavix guidance will need to come from his neurologist.  His wife tells me that neurology cleared him to hold Plavix x7 days prior to the procedure.  Again, will need to restart when medically safe to do so.  Therefore, based on ACC/AHA guidelines, the patient would be at acceptable risk for the planned procedure without further cardiovascular testing.   The patient was advised that if he develops new symptoms prior to surgery to contact our office to arrange for a follow-up visit, and he verbalized understanding.  I will route this recommendation to the requesting party via Epic fax function and remove from pre-op pool. Please call with questions.  Elgie Collard, PA-C 04/09/2022, 11:52 AM

## 2022-04-09 NOTE — Telephone Encounter (Signed)
See phone note

## 2022-04-09 NOTE — Telephone Encounter (Signed)
Alan Mcdonald called and said they never received the clearance back in 01/2022 from our office. I stated that we had faxed the clearance notes from 01/23/22 Alan Browner, NP. I did ask Alan Mcdonald why did she wait so long to let us know never received 01/23/22 notes. Alan Mcdonald stated to me that the pt just called her.   I then called the pt and s/w his wife and she said she has had a hard time with Dr.Eichman office getting this done. I said let me s/w the pre op provider and let me see what we can do to take care of the pt.  I then s/w Alan Mcdonald, [facility] and she states she will do a regular phone appt, no charge as our office has already cleared the pt andf this is a re-do per Alan Mcdonald.   I then called the pt and his wife back and that Alan Mcdonald will call today at 11:30 and will fax notes to Dr. Davy Pique office. I will also old school fax notes as well. Pt and his wife are super grateful for our help in this matter.

## 2022-04-12 ENCOUNTER — Telehealth: Payer: Self-pay | Admitting: Cardiology

## 2022-04-12 NOTE — Telephone Encounter (Signed)
Pt sent this via Mychart to the sching pool:     No vomiting,no nausea, no sob Cp coming and going I have not taken nitroglycerin    Good Afternoon Smitty,  Can you tell me a little more about your chest pain    1. Are you having CP right now?   2. Are you experiencing any other symptoms (ex. SOB, nausea, vomiting, sweating)?   3. How long have you been experiencing CP?   4. Is your CP continuous or coming and going?   5. Have you taken Nitroglycerin?    Comments: My family doctor has concerns about the by  still running in higher numbers and low heart rate. I am having some left side upper chest pinching not all the time but is continuing to happen.

## 2022-04-12 NOTE — Telephone Encounter (Signed)
I spoke with patient and his wife.  They report patient saw PCP at [facility] today and PCP was concerned about patient's elevated BP and low heart rate. Amlodipine has been adjusted due to low blood pressure in the past.  Dose has ranged from 2.5 mg daily to 5 mg daily.  He is currently taking 5 mg daily. Heart rate has been running in mid 50's  and BP around 140/75 but did go up to 169 systolic at one time.  Patient has been keeping record of readings.  Patient also reports "twinge" in left chest above his heart.  Occurs about once per week for the last 6 weeks or so. Lasts a couple of minutes.  Patient describes as feeling like a "pinch".  Patient initially said pain happened at random times and was not related to exertion.  He then said he had one episode while walking on the treadmill recently. Patient is scheduled for injection on 11/9 and was cleared for this on 10/30.  He just stopped ASA and Plavix and is asking if he should resume these and post pone procedure.  I have scheduled patient to see Richardson Dopp, PA on 11/7 for evaluation.  Will check with preop team regarding medications and procedure.

## 2022-04-12 NOTE — Telephone Encounter (Signed)
I spoke with patient's wife and gave her information regarding postponing procedure and resuming medications.  Patient will follow up with Richardson Dopp, PA on 11/7

## 2022-04-12 NOTE — Telephone Encounter (Signed)
   Patient Name: Alan Mcdonald  DOB: 1951-05-30 MRN: [id number]  Primary Cardiologist: Candee Furbish, MD  Chart reviewed as part of pre-operative protocol coverage.   Received message from triage nurse that patient has had recent concern for elevated BP, chest discomfort.  Would recommend patient postpone upcoming procedure and resume aspirin and Plavix at this time.  He has been scheduled for an office visit on 04/17/2022 for further evaluation in case there are any issues that would impact surgical recommendations.   I will route this message as FYI to requesting party and remove this message from the preop box as separate preop APP input not needed at this time.   Please call with any questions.  Lenna Sciara, NP 04/12/2022, 1:58 PM

## 2022-04-16 DIAGNOSIS — Z01810 Encounter for preprocedural cardiovascular examination: Secondary | ICD-10-CM | POA: Insufficient documentation

## 2022-04-16 NOTE — Progress Notes (Unsigned)
Cardiology Office Note:    Date:  04/17/2022   ID:  Dorette Grate, DOB 1950-09-02, MRN [id number]  PCP:  Zachery Dauer, MD  [facility] Prov[facility] Cardiologist:  Candee Furbish, MD    Referring MD: Zachery Dauer, MD   Chief Complaint:  Chest Pain and Hypertension    Patient Profile: Coronary artery disease  Aortic stenosis S/p prior PCI to RCA NSTEMI S/p CABG + bioprosthetic AVR in 02/2020  Echo 02/18/21: EF 60-65, no RWMA, mild LVh, normal RVSF, AVR ok (mean 3.6 mmHg), RAP 3 S/p CVA 02/2021 Head/Neck CTA 04/17/21: no Lg vessel occl; no carotid or vertebral art stenosis  Hypertension  Hyperlipidemia Follicular Lymphoma    Cardiac Studies & Procedures   CARDIAC CATHETERIZATION  CARDIAC CATHETERIZATION 02/22/2020  Narrative  Severe distal left main disease  Proximal LAD 60-70% and 80% mid before large diagonal.  95% ostial circumflex stenosis and 70% dominant obtuse marginal proximal stenosis.  Diffuse proximal distal 60% stenosis some of which is diffuse in-stent.  Faint septal perforator collaterals emanate from the PDA towards the LAD which is not opacified.  LV function is normal.  LVEDP is 8 mmHg.  Heavily calcified aortic valve with 15 mm mean gradient on pullback.  RECOMMENDATIONS:   He developed prolonged chest pain immediately post cath.  IV fluid bolus was administered, IV nitroglycerin started, and 50 mcg of fentanyl were given.  The pain gradually resolved over 15 to 20 minutes.  In lab consult with Dr. Roxy Manns we decided to do urgent CABG on the patient.  Initial plan to place intra-aortic balloon pump was decided against after decision for urgent surgery.  Findings Coronary Findings Diagnostic  Dominance: Right  Left Main Mid LM to Dist LM lesion is 99% stenosed.  Left Anterior Descending There is moderate diffuse disease throughout the vessel. Ost LAD to Prox LAD lesion is 75% stenosed. Mid LAD lesion is 80% stenosed.  First Diagonal  Branch Vessel is small in size.  Second Septal Branch  Left Circumflex Vessel is moderate in size. Ost Cx to Prox Cx lesion is 95% stenosed.  First Obtuse Marginal Branch The vessel exhibits minimal luminal irregularities. 1st Mrg lesion is 70% stenosed.  Second Obtuse Marginal Branch Vessel is small in size.  Right Coronary Artery There is mild diffuse disease throughout the vessel. Prox RCA lesion is 45% stenosed. Prox RCA to Mid RCA lesion is 60% stenosed. The lesion was previously treated. Dist RCA lesion is 50% stenosed.  First Right Posterolateral Branch Vessel is small in size.  Second Right Posterolateral Branch Vessel is large in size.  Third Right Posterolateral Branch Vessel is small in size.  Intervention  No interventions have been documented.     ECHOCARDIOGRAM  ECHOCARDIOGRAM COMPLETE 02/18/2021  Narrative ECHOCARDIOGRAM REPORT    Patient Name:   CAGE GUPTON Date of Exam: 02/18/2021 Medical Rec #:  [id number]   Height:       62.0 in Accession #:    [id number]  Weight:       155.0 lb Date of Birth:  December 20, 1950   BSA:          1.715 m Patient Age:    1 years    BP:           155/74 mmHg Patient Gender: M           HR:           55 bpm. Exam Location:  Inpatient  Procedure: 2D Echo, Cardiac Doppler  and Color Doppler  Indications:    CVA  History:        Patient has prior history of Echocardiogram examinations, most recent 04/12/2020. Acute MI, Prior CABG; Risk Factors:Hypertension. TAVR. Aortic Valve: bioprosthetic valve is present in the aortic position.  Sonographer:    [place] Referring Phys: [place]   1. Left ventricular ejection fraction, by estimation, is 60 to 65%. The left ventricle has normal function. The left ventricle has no regional wall motion abnormalities. There is mild left ventricular hypertrophy. Left ventricular diastolic parameters are indeterminate. 2. Right ventricular systolic function is  normal. The right ventricular size is normal. Tricuspid regurgitation signal is inadequate for assessing PA pressure. 3. The mitral valve is normal in structure. No evidence of mitral valve regurgitation. No evidence of mitral stenosis. 4. The aortic valve has been repaired/replaced. Aortic valve regurgitation is not visualized. No aortic stenosis is present. There is a bioprosthetic valve present in the aortic position. 5. The inferior vena cava is normal in size with greater than 50% respiratory variability, suggesting right atrial pressure of 3 mmHg.  FINDINGS Left Ventricle: Left ventricular ejection fraction, by estimation, is 60 to 65%. The left ventricle has normal function. The left ventricle has no regional wall motion abnormalities. The left ventricular internal cavity size was normal in size. There is mild left ventricular hypertrophy. Left ventricular diastolic parameters are indeterminate.  Right Ventricle: The right ventricular size is normal. Right ventricular systolic function is normal. Tricuspid regurgitation signal is inadequate for assessing PA pressure. The tricuspid regurgitant velocity is 0.92 m/s, and with an assumed right atrial pressure of 3 mmHg, the estimated right ventricular systolic pressure is 6.4 mmHg.  Left Atrium: Left atrial size was normal in size.  Right Atrium: Right atrial size was normal in size.  Pericardium: There is no evidence of pericardial effusion.  Mitral Valve: The mitral valve is normal in structure. Mild mitral annular calcification. No evidence of mitral valve regurgitation. No evidence of mitral valve stenosis.  Tricuspid Valve: The tricuspid valve is normal in structure. Tricuspid valve regurgitation is not demonstrated. No evidence of tricuspid stenosis.  Aortic Valve: The aortic valve has been repaired/replaced. Aortic valve regurgitation is not visualized. No aortic stenosis is present. Aortic valve mean gradient measures 3.6 mmHg.  Aortic valve peak gradient measures 6.8 mmHg. Aortic valve area, by VTI measures 3.63 cm. There is a bioprosthetic valve present in the aortic position.  Pulmonic Valve: The pulmonic valve was not well visualized. Pulmonic valve regurgitation is not visualized. No evidence of pulmonic stenosis.  Aorta: The aortic root is normal in size and structure.  Venous: The inferior vena cava is normal in size with greater than 50% respiratory variability, suggesting right atrial pressure of 3 mmHg.  IAS/Shunts: No atrial level shunt detected by color flow Doppler.   LEFT VENTRICLE PLAX 2D LVIDd:         3.80 cm     Diastology LVIDs:         2.75 cm     LV e' medial:    8.38 cm/s LV PW:         1.70 cm     LV E/e' medial:  13.0 LV IVS:        1.40 cm     LV e' lateral:   7.62 cm/s LVOT diam:     2.00 cm     LV E/e' lateral: 14.3 LV SV:  105 LV SV Index:   61 LVOT Area:     3.14 cm  LV Volumes (MOD) LV vol d, MOD A4C: 49.6 ml LV vol s, MOD A4C: 17.8 ml LV SV MOD A4C:     49.6 ml  RIGHT VENTRICLE            IVC RV S prime:     4.68 cm/s  IVC diam: 1.20 cm TAPSE (M-mode): 1.5 cm  LEFT ATRIUM             Index       RIGHT ATRIUM          Index LA diam:        4.60 cm 2.68 cm/m  RA Area:     7.98 cm LA Vol (A2C):   43.1 ml 25.12 ml/m RA Volume:   13.10 ml 7.64 ml/m LA Vol (A4C):   42.4 ml 24.72 ml/m LA Biplane Vol: 44.6 ml 26.00 ml/m AORTIC VALVE AV Area (Vmax):    3.03 cm AV Area (Vmean):   3.65 cm AV Area (VTI):     3.63 cm AV Vmax:           130.58 cm/s AV Vmean:          80.860 cm/s AV VTI:            0.290 m AV Peak Grad:      6.8 mmHg AV Mean Grad:      3.6 mmHg LVOT Vmax:         126.00 cm/s LVOT Vmean:        93.900 cm/s LVOT VTI:          0.335 m LVOT/AV VTI ratio: 1.16  AORTA Ao Root diam: 3.40 cm Ao Asc diam:  3.00 cm  MITRAL VALVE                TRICUSPID VALVE MV Area (PHT): 3.28 cm     TR Peak grad:   3.4 mmHg MV E velocity: 109.00 cm/s  TR  Vmax:        92.30 cm/s MV A velocity: 106.00 cm/s MV E/A ratio:  1.03         SHUNTS Systemic VTI:  0.34 m Systemic Diam: 2.00 cm  Kirk Ruths MD Electronically signed by Kirk Ruths MD Signature Date/Time: 02/18/2021/1:15:46 PM    Final   TEE  ECHO INTRAOPERATIVE TEE 02/23/2020  Interpretation Summary *INTRAOPERATIVE TRANSESOPHAGEAL REPORT *    Patient Name:   NOEMI ISHMAEL Date of Exam: 02/22/2020 Medical Rec #:  [id number]   Height:       63.0 in Accession #:    [id number]  Weight:       154.7 lb Date of Birth:  [birth year]   BSA:          1.73 m Patient Age:    51 years    BP:           123/77 mmHg Patient Gender: M           HR:           62 bpm. Exam Location:  Inpatient  Transesophogeal exam was perform intraoperatively during surgical procedure. Patient was closely monitored under general anesthesia during the entirety of examination.  Indications:     CABG Sonographer:     Clayton Lefort RDCS (AE) Performing Phys: [place] Phys: Roberts Gaudy MD  PROCEDURE: Intraoperative Transesophogeal The TEE exam was begun by Dr. Oleta Mouse who  inserted the probe and performed the pre-bypass exam. The post-bypass exam was performed by Dr. Roberts Gaudy. The report was made by Dr. Roberts Gaudy. Complications: No known complications during this procedure. PRE-OP FINDINGS Left Ventricle: The left ventricle has hyperdynamic systolic function, with an ejection fraction of >65%. The cavity size was normal. There is moderately increased left ventricular wall thickness.  On the post-bypass exam, there was LV contractile dyssynchrony due to ventricular pacing. There was mild to moderate diffuse LV hypokinesis. The ejection fraction was estimated at 40-45%.  Right Ventricle: On the pre-bypass exam, the RV was mildly enlarged. There was normal RV systolic function.  On the post-bypass exam, the RV size was increased from the pre-bypass exam. There was  moderate RV systolic dysfunction which improved during the post-bypass period. There was no interventricular septal flattening noted.  Left Atrium: The left atrial appendage is well visualized and there is evidence of thrombus present.  Right Atrium: On the pre-bypass exam, the interatrial septum was in a neutral position.  On the post-bypass exam, the interatrial septum bowed from right to left consistent with elevated right sided pressures.  Interatrial Septum: No atrial level shunt detected by color flow Doppler.  Pericardium: There is no evidence of pericardial effusion.  Mitral Valve: The mitral valve leaflets had normal thickness. There was mild mitral annular calcification which involved the base of the posterior leaflet. The leaflets opened normally. The leaflets coapted normally and there was mild mitral insuuficiency.  On the post-bypass exam, the mitral valve appeared unchanged from the pre-bypass exam, there was trace mitral insufficiency.  Tricuspid Valve: The tricuspid valve was normal in structure. Tricuspid valve regurgitation is trivial by color flow Doppler. On the post-bypass exam, there was trivial tricuspid regurgitaion.  Aortic Valve: The aortic valve was tri-leaflet with severe leaflet thickening and restriction to opening. The left coronary cusp was immobile and the right and noncoronary cusps had restricted motion. The was mild to moderate aortic stenosis. The peak transaortic velocity was 2.79 m/sec. with a peak gradient of 31 mm hg and a mean gradient of 17 mm hg. The velocity ratio was 0.46. There was no aortic insufficiency seen.  On the post-bypass exam, there was a bio-prosthetic valve in the aortic position. The leaflets opened normally. There was a small central jet of aortic insufficiency which was within the sewing ring. The peak trans-aortic velocity was 1.24 m/sec. with a mean gradient of 3 mm hg.  Pulmonic Valve: The pulmonic valve was normal in  structure, with normal. Pulmonic valve regurgitation is trivial by color flow Doppler.   Aorta: The ascending aortic and aortic root were normal in size with normal wall thickness.   Roberts Gaudy MD Electronically signed by Roberts Gaudy MD Signature Date/Time: 02/23/2020/3:40:50 PM    Final             History of Present Illness:   Hawk Mones is a 71 y.o. male with the above problem list.  He was last seen by Dr. Marlou Porch in 07/2021.  The patient called in recently with elevated BPs and low HRs (noted by PCP at [facility] Notes indicate patient's Amlodipine has been adjusted in the past due to low BP. Pt also noted chest pain. He is currently off ASA and Plavix for upcoming ESI on 11/9.  Notes indicate he was asked to resume ASA and Plavix.  He is here today with his wife.  Over the past couple of months, he has had a left-sided chest discomfort he  describes as a pinch.  It may last for 5 minutes.  He has not taken nitroglycerin.  He has developed it while walking on the treadmill in the past.  He was able to keep exercising and his pain resolved.  These have only happened occasionally.  He has not had any associated radiation, shortness of breath, nausea, diaphoresis.  He has not had shortness of breath with exertion.  He has not had symptoms like his previous myocardial infarction.  He has not had orthopnea or significant leg edema.  He has not had syncope.        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    amLODipine (NORVASC) 5 MG tablet Take 1.5 tablets (7.5 mg total) by mouth daily.   aspirin EC 81 MG tablet Take 81 mg by mouth 2 (two) times daily. Swallow whole.   atorvastatin (LIPITOR) 80 MG tablet Take 1 tablet (80 mg total) by mouth every evening.   B Complex Vitamins (VITAMIN B-COMPLEX) TABS Take 1 tablet by mouth daily.    carvedilol (COREG) 3.125 MG tablet Take 1 tablet (3.125 mg total) by mouth 2 (two) times daily with a meal.   cholecalciferol (VITAMIN D3) 25 MCG (1000 UNIT) tablet Take 1,000 Units by mouth daily.   clopidogrel (PLAVIX) 75 MG tablet Take 75 mg by mouth daily.   Coenzyme Q10 (CO Q-10) 200 MG CAPS Take 1 tablet by mouth daily.   ferrous sulfate 325 (65 FE) MG tablet Take 1 tablet by mouth 3 (three) times a week.   glucosamine-chondroitin 500-400 MG tablet Take 1 tablet by mouth daily.   losartan (COZAAR) 100 MG tablet Take 1 tablet (100 mg total) by mouth daily.   Multiple Vitamin (MULTIVITAMIN WITH MINERALS) TABS tablet Take 1 tablet by mouth daily.   OVER THE COUNTER MEDICATION Take 500 mg by mouth daily. (GINGER EXTRACT)   OVER THE COUNTER MEDICATION Take 500 mg by mouth daily. (FENUGREEK)   RA KRILL OIL 500 MG CAPS Take 500 mg by mouth daily.   Turmeric 500 MG CAPS Take 500 mg by mouth daily.   Zinc 30 MG CAPS Take 30 mg by mouth daily.   [DISCONTINUED] amLODipine (NORVASC) 5 MG tablet Take 0.5 tablets (2.5 mg total) by mouth daily.   Current Facility-Administered Medications for the 04/17/22 encounter (Office Visit) with Richardson Dopp T, PA-C  Medication   betamethasone acetate-betamethasone sodium phosphate (CELESTONE) injection 3 mg    Allergies:   Patient has no known allergies.   Social History   Tobacco Use   Smoking status: Former    Packs/day: 0.50    Years: 25.00    Total pack years: 12.50    Types: Cigarettes    Quit date: 2003    Years since quitting: 20.8   Smokeless tobacco: Never  Vaping Use   Vaping Use: Never used  Substance Use Topics    Alcohol use: Yes    Comment: occasional  Drug use: Never    Family Hx: The patient's family history is not on file.  Review of Systems  Constitutional: Negative for chills and fever.  Respiratory:  Negative for cough.   Gastrointestinal:  Positive for diarrhea (resolved). Negative for hematochezia.  Genitourinary:  Negative for hematuria.     EKGs/Labs/Other Test Reviewed:    EKG:  EKG is  ordered today.  The ekg ordered today demonstrates sinus bradycardia, HR 54, normal axis, right bundle branch block, inferolateral T wave inversions, QTc 436   Recent Labs: No results found for requested labs within last 365 days.   Recent Lipid Panel No results for input(s): "CHOL", "TRIG", "HDL", "VLDL", "LDLCALC", "LDLDIRECT" in the last 8760 hours.    Risk Assessment/Calculations/Metrics:              Physical Exam:    VS:  BP 134/60   Pulse (!) 54   Ht '5\' 2"'$  (1.575 m)   Wt 154 lb 3.2 oz (69.9 kg)   SpO2 97%   BMI 28.20 kg/m     Wt Readings from Last 3 Encounters:  04/17/22 154 lb 3.2 oz (69.9 kg)  09/27/21 150 lb (68 kg)  07/24/21 151 lb (68.5 kg)    Constitutional:      Appearance: Healthy appearance. Not in distress.  Neck:     Vascular: JVD normal.  Pulmonary:     Effort: Pulmonary effort is normal.     Breath sounds: No wheezing. No rales.  Cardiovascular:     Normal rate. Regular rhythm. Normal S1. Normal S2.      Murmurs: There is a grade 2/6 harsh systolic murmur at the URSB.  Edema:    Peripheral edema absent.  Abdominal:     Palpations: Abdomen is soft.  Skin:    General: Skin is warm and dry.  Neurological:     General: No focal deficit present.     Mental Status: Alert and oriented to person, place and time.         ASSESSMENT & PLAN:   Coronary artery disease History of prior PCI to the RCA and subsequent non-STEMI in 2021 followed by CABG plus AVR.  Over the past couple of months, he has noted left-sided chest discomfort.  This has some  typical and atypical features.  He has noted it with exertion.  It does last for several minutes.  He has been able to exert himself without chest discomfort as well.  His electrocardiogram does have some subtle T wave inversions inferolaterally.  This appears to be new from the last tracing.  He needs to hold aspirin and Plavix to proceed with epidural steroid injection. Arrange exercise Myoview Arrange 2D echocardiogram Continue aspirin 81 mg daily, Plavix 75 mg daily, carvedilol 3.125 mg twice daily, Lipitor 80 mg daily. Follow-up with Dr. Marlou Porch in February or sooner if Myoview/echo abnormal  Essential (primary) hypertension He brings in a detailed list of his blood pressures from home.  The majority of his blood pressures are above target.  He had difficulty with low blood pressures in the past with amlodipine and reduced the dose from 5 to 2.5 mg daily.  He inadvertently increased his amlodipine back to 5 mg a couple of months ago.  I have recommended that we increase his amlodipine to 7.5 mg daily.  Continue carvedilol 3.125 mg twice daily, losartan 100 mg daily.  I have asked him to notify us if his blood pressure still remains above target with this adjustment.  Hyperlipidemia Lipids optimal in October 2022.  Continue Lipitor 80 mg daily.  Aortic stenosis Status post AVR in September 2021.  Obtain follow-up echocardiogram.  Continue SBE prophylaxis.  Preoperative cardiovascular examination He has resumed his aspirin and Plavix.  Continue both for now.  As noted, stress Myoview will be obtained as well as an echocardiogram.  As long as his Myoview was low risk and echocardiogram is stable, he will be able to proceed with his ESI.        Shared Decision Making/Informed Consent The risks [chest pain, shortness of breath, cardiac arrhythmias, dizziness, blood pressure fluctuations, myocardial infarction, stroke/transient ischemic attack, nausea, vomiting, allergic reaction, radiation  exposure, metallic taste sensation and life-threatening complications (estimated to be 1 in 10,000)], benefits (risk stratification, diagnosing coronary artery disease, treatment guidance) and alternatives of a nuclear stress test were discussed in detail with Mr. Charland and he agrees to proceed.   Dispo:  Return in 3 months (on 07/20/2022) for Scheduled Follow Up w/ Dr. Marlou Porch.   Medication Adjustments/Labs and Tests Ordered: Current medicines are reviewed at length with the patient today.  Concerns regarding medicines are outlined above.  Tests Ordered: Orders Placed This Encounter  Procedures   MYOCARDIAL PERFUSION IMAGING   EKG 12-Lead   ECHOCARDIOGRAM COMPLETE   Medication Changes: Meds ordered this encounter  Medications   amLODipine (NORVASC) 5 MG tablet    Sig: Take 1.5 tablets (7.5 mg total) by mouth daily.    Dispense:  135 tablet    Refill:  3   Signed, Richardson Dopp, PA-C  04/17/2022 9:07 AM    [address], [place], [place]  [address] Phone: [phone]; Fax: [phone]

## 2022-04-17 ENCOUNTER — Encounter: Payer: Self-pay | Admitting: Physician Assistant

## 2022-04-17 ENCOUNTER — Encounter: Payer: Self-pay | Admitting: *Deleted

## 2022-04-17 ENCOUNTER — Ambulatory Visit: Payer: Medicare Other | Attending: Physician Assistant | Admitting: Physician Assistant

## 2022-04-17 VITALS — BP 134/60 | HR 54 | Ht 62.0 in | Wt 154.2 lb

## 2022-04-17 DIAGNOSIS — I10 Essential (primary) hypertension: Secondary | ICD-10-CM | POA: Insufficient documentation

## 2022-04-17 DIAGNOSIS — I6330 Cerebral infarction due to thrombosis of unspecified cerebral artery: Secondary | ICD-10-CM | POA: Diagnosis not present

## 2022-04-17 DIAGNOSIS — Z953 Presence of xenogenic heart valve: Secondary | ICD-10-CM | POA: Diagnosis not present

## 2022-04-17 DIAGNOSIS — I350 Nonrheumatic aortic (valve) stenosis: Secondary | ICD-10-CM | POA: Diagnosis present

## 2022-04-17 DIAGNOSIS — I2510 Atherosclerotic heart disease of native coronary artery without angina pectoris: Secondary | ICD-10-CM

## 2022-04-17 DIAGNOSIS — I25119 Atherosclerotic heart disease of native coronary artery with unspecified angina pectoris: Secondary | ICD-10-CM | POA: Insufficient documentation

## 2022-04-17 DIAGNOSIS — E782 Mixed hyperlipidemia: Secondary | ICD-10-CM | POA: Insufficient documentation

## 2022-04-17 DIAGNOSIS — Z01810 Encounter for preprocedural cardiovascular examination: Secondary | ICD-10-CM | POA: Diagnosis not present

## 2022-04-17 DIAGNOSIS — R079 Chest pain, unspecified: Secondary | ICD-10-CM | POA: Insufficient documentation

## 2022-04-17 MED ORDER — AMLODIPINE BESYLATE 5 MG PO TABS: 7.5000 mg | ORAL_TABLET | Freq: Every day | ORAL | 3 refills | Status: DC

## 2022-04-17 NOTE — Assessment & Plan Note (Signed)
He has resumed his aspirin and Plavix.  Continue both for now.  As noted, stress Myoview will be obtained as well as an echocardiogram.  As long as his Myoview was low risk and echocardiogram is stable, he will be able to proceed with his ESI.

## 2022-04-17 NOTE — Assessment & Plan Note (Signed)
Status post AVR in September 2021.  Obtain follow-up echocardiogram.  Continue SBE prophylaxis.

## 2022-04-17 NOTE — Patient Instructions (Signed)
Medication Instructions:  Your physician has recommended you make the following change in your medication:   INCREASE the Amlodipine to 5 mg taking 1 1/2 tablet daily.  If you have any of the 2.5 mg tablets left, you can take 1 5 mg tablet and 1 2.5 mg tablet together to use them up  *If you need a refill on your cardiac medications before your next appointment, please call your pharmacy*   Lab Work: None ordered  If you have labs (blood work) drawn today and your tests are completely normal, you will receive your results only by: [place] (if you have MyChart) OR A paper copy in the mail If you have any lab test that is abnormal or we need to change your treatment, we will call you to review the results.   Testing/Procedures: Your physician has requested that you have an echocardiogram. Echocardiography is a painless test that uses sound waves to create images of your heart. It provides your doctor with information about the size and shape of your heart and how well your heart's chambers and valves are working. This procedure takes approximately one hour. There are no restrictions for this procedure. Please do NOT wear cologne, perfume, aftershave, or lotions (deodorant is allowed). Please arrive 15 minutes prior to your appointment time.  Your physician has requested that you have en exercise stress myoview. For further information please visit [email or web address]. Please follow instruction sheet, as given.    Follow-Up: At [facility], you and your health needs are our priority.  As part of our continuing mission to provide you with exceptional heart care, we have created designated Provider Care Teams.  These Care Teams include your primary Cardiologist (physician) and Advanced Practice Providers (APPs -  Physician Assistants and Nurse Practitioners) who all work together to provide you with the care you need, when you need it.  We recommend signing up for the patient  portal called "MyChart".  Sign up information is provided on this After Visit Summary.  MyChart is used to connect with patients for Virtual Visits (Telemedicine).  Patients are able to view lab/test results, encounter notes, upcoming appointments, etc.  Non-urgent messages can be sent to your provider as well.   To learn more about what you can do with MyChart, go to [email or web address].    Your next appointment:   As scheduled   The format for your next appointment:   In Person  Provider:   Candee Furbish, MD     Other Instructions   Important Information About Sugar

## 2022-04-17 NOTE — Assessment & Plan Note (Signed)
Lipids optimal in October 2022.  Continue Lipitor 80 mg daily.

## 2022-04-17 NOTE — Assessment & Plan Note (Signed)
He brings in a detailed list of his blood pressures from home.  The majority of his blood pressures are above target.  He had difficulty with low blood pressures in the past with amlodipine and reduced the dose from 5 to 2.5 mg daily.  He inadvertently increased his amlodipine back to 5 mg a couple of months ago.  I have recommended that we increase his amlodipine to 7.5 mg daily.  Continue carvedilol 3.125 mg twice daily, losartan 100 mg daily.  I have asked him to notify us if his blood pressure still remains above target with this adjustment.

## 2022-04-17 NOTE — Assessment & Plan Note (Addendum)
History of prior PCI to the RCA and subsequent non-STEMI in 2021 followed by CABG plus AVR.  Over the past couple of months, he has noted left-sided chest discomfort.  This has some typical and atypical features.  He has noted it with exertion.  It does last for several minutes.  He has been able to exert himself without chest discomfort as well.  His electrocardiogram does have some subtle T wave inversions inferolaterally.  This appears to be new from the last tracing.  He needs to hold aspirin and Plavix to proceed with epidural steroid injection. Arrange exercise Myoview Arrange 2D echocardiogram Continue aspirin 81 mg daily, Plavix 75 mg daily, carvedilol 3.125 mg twice daily, Lipitor 80 mg daily. Follow-up with Dr. Marlou Porch in February or sooner if Myoview/echo abnormal

## 2022-05-01 ENCOUNTER — Telehealth (HOSPITAL_COMMUNITY): Payer: Self-pay | Admitting: *Deleted

## 2022-05-01 NOTE — Telephone Encounter (Signed)
Patient given detailed instructions per Myocardial Perfusion Study Information Sheet for the test on 05/07/2022 at 7:15. Patient notified to arrive 15 minutes early and that it is imperative to arrive on time for appointment to keep from having the test rescheduled.  If you need to cancel or reschedule your appointment, please call the office within 24 hours of your appointment. . Patient verbalized understanding.Alan Mcdonald

## 2022-05-07 ENCOUNTER — Ambulatory Visit (HOSPITAL_BASED_OUTPATIENT_CLINIC_OR_DEPARTMENT_OTHER): Payer: Medicare Other

## 2022-05-07 ENCOUNTER — Ambulatory Visit (HOSPITAL_COMMUNITY): Payer: Medicare Other | Attending: Physician Assistant

## 2022-05-07 DIAGNOSIS — E782 Mixed hyperlipidemia: Secondary | ICD-10-CM | POA: Insufficient documentation

## 2022-05-07 DIAGNOSIS — I25119 Atherosclerotic heart disease of native coronary artery with unspecified angina pectoris: Secondary | ICD-10-CM | POA: Insufficient documentation

## 2022-05-07 DIAGNOSIS — Z953 Presence of xenogenic heart valve: Secondary | ICD-10-CM | POA: Insufficient documentation

## 2022-05-07 DIAGNOSIS — R079 Chest pain, unspecified: Secondary | ICD-10-CM | POA: Insufficient documentation

## 2022-05-07 DIAGNOSIS — I10 Essential (primary) hypertension: Secondary | ICD-10-CM | POA: Insufficient documentation

## 2022-05-07 DIAGNOSIS — Z01810 Encounter for preprocedural cardiovascular examination: Secondary | ICD-10-CM | POA: Insufficient documentation

## 2022-05-07 LAB — MYOCARDIAL PERFUSION IMAGING
LV dias vol: 75 mL (ref 62–150)
LV sys vol: 22 mL
Nuc Stress EF: 70 %
Peak HR: 114 {beats}/min
Rest HR: 55 {beats}/min
Rest Nuclear Isotope Dose: 9.9 mCi
SDS: 2
SRS: 0
SSS: 2
Stress Nuclear Isotope Dose: 32.6 mCi
TID: 1.04

## 2022-05-07 LAB — ECHOCARDIOGRAM COMPLETE
AV Mean grad: 4 mmHg
AV Peak grad: 7.6 mmHg
Ao pk vel: 1.37 m/s
Area-P 1/2: 3.65 cm2
Height: 62 in
S' Lateral: 2.8 cm
Weight: 2464 oz

## 2022-05-07 MED ORDER — TECHNETIUM TC 99M TETROFOSMIN IV KIT
32.6000 | PACK | Freq: Once | INTRAVENOUS | Status: AC | PRN
  Administered 2022-05-07: 32.6 via INTRAVENOUS

## 2022-05-07 MED ORDER — TECHNETIUM TC 99M TETROFOSMIN IV KIT
9.9000 | PACK | Freq: Once | INTRAVENOUS | Status: AC | PRN
  Administered 2022-05-07: 9.9 via INTRAVENOUS

## 2022-05-07 MED ORDER — PERFLUTREN LIPID MICROSPHERE
1.0000 mL | INTRAVENOUS | Status: AC | PRN
  Administered 2022-05-07: 2 mL via INTRAVENOUS

## 2022-05-07 MED ORDER — REGADENOSON 0.4 MG/5ML IV SOLN
0.4000 mg | Freq: Once | INTRAVENOUS | Status: AC
  Administered 2022-05-07: 0.4 mg via INTRAVENOUS

## 2022-05-22 MED ORDER — AMLODIPINE BESYLATE 10 MG PO TABS: 10.0000 mg | ORAL_TABLET | Freq: Every day | ORAL | 3 refills | Status: DC

## 2022-05-22 NOTE — Telephone Encounter (Signed)
Please make sure patient understands instructions to increase Amlodipine to 10 mg once daily. Call his wife's number as outlined in his message. Richardson Dopp, PA-C    05/22/2022 3:31 PM

## 2022-05-22 NOTE — Telephone Encounter (Signed)
Call placed to [facility], Pt's wife to make sure pt got message about increasing Amlodipine to '10mg'$  daily. Asked her to send a mychart message this evening or call me back tomorrow.

## 2022-05-23 NOTE — Telephone Encounter (Signed)
Call placed to pt's wife, Pam.  She has confirmed pt received the message and has started the Amlodipine 10 mg today.

## 2022-05-28 DIAGNOSIS — I10 Essential (primary) hypertension: Secondary | ICD-10-CM

## 2022-05-29 MED ORDER — HYDROCHLOROTHIAZIDE 25 MG PO TABS: 25.0000 mg | ORAL_TABLET | Freq: Every day | ORAL | 11 refills | Status: DC

## 2022-05-29 MED ORDER — HYDROCHLOROTHIAZIDE 12.5 MG PO TABS: 12.5000 mg | ORAL_TABLET | Freq: Every day | ORAL | 11 refills | Status: AC

## 2022-05-29 NOTE — Telephone Encounter (Signed)
Start HCTZ 12.5 mg once daily. BMET 1 week  Med/Lab ordered. Please arrange lab in 1 week. Richardson Dopp, PA-C    05/29/2022 1:42 PM

## 2022-06-06 ENCOUNTER — Ambulatory Visit: Payer: Medicare Other | Attending: Physician Assistant

## 2022-06-06 DIAGNOSIS — I10 Essential (primary) hypertension: Secondary | ICD-10-CM

## 2022-06-06 LAB — BASIC METABOLIC PANEL
BUN/Creatinine Ratio: 18 (ref 10–24)
BUN: 16 mg/dL (ref 8–27)
CO2: 24 mmol/L (ref 20–29)
Calcium: 8.9 mg/dL (ref 8.6–10.2)
Chloride: 102 mmol/L (ref 96–106)
Creatinine, Ser: 0.87 mg/dL (ref 0.76–1.27)
Glucose: 163 mg/dL — ABNORMAL HIGH (ref 70–99)
Potassium: 3.8 mmol/L (ref 3.5–5.2)
Sodium: 141 mmol/L (ref 134–144)
eGFR: 92 mL/min/{1.73_m2} (ref >59)

## 2022-07-20 ENCOUNTER — Ambulatory Visit: Payer: Medicare Other | Attending: Cardiology | Admitting: Cardiology

## 2022-07-20 ENCOUNTER — Encounter: Payer: Self-pay | Admitting: Cardiology

## 2022-07-20 VITALS — BP 108/56 | HR 53 | Ht 63.0 in | Wt 153.6 lb

## 2022-07-20 DIAGNOSIS — Z953 Presence of xenogenic heart valve: Secondary | ICD-10-CM | POA: Diagnosis present

## 2022-07-20 DIAGNOSIS — I2510 Atherosclerotic heart disease of native coronary artery without angina pectoris: Secondary | ICD-10-CM | POA: Diagnosis present

## 2022-07-20 DIAGNOSIS — I10 Essential (primary) hypertension: Secondary | ICD-10-CM

## 2022-07-20 NOTE — Patient Instructions (Signed)
Medication Instructions:  Your physician recommends that you continue on your current medications as directed. Please refer to the Current Medication list given to you today.  *If you need a refill on your cardiac medications before your next appointment, please call your pharmacy*   Lab Work: None.  If you have labs (blood work) drawn today and your tests are completely normal, you will receive your results only by: [place] (if you have MyChart) OR A paper copy in the mail If you have any lab test that is abnormal or we need to change your treatment, we will call you to review the results.   Testing/Procedures: None.   Follow-Up: At [facility], you and your health needs are our priority.  As part of our continuing mission to provide you with exceptional heart care, we have created designated Provider Care Teams.  These Care Teams include your primary Cardiologist (physician) and Advanced Practice Providers (APPs -  Physician Assistants and Nurse Practitioners) who all work together to provide you with the care you need, when you need it.   Your next appointment:   1 year(s)  Provider:   Candee Furbish, MD

## 2022-07-20 NOTE — Progress Notes (Signed)
Cardiology Office Note:    Date:  07/20/2022   ID:  Alan Mcdonald, DOB [birth year], MRN [id number]  PCP:  Zachery Dauer, MD   [facility] Providers Cardiologist:  Candee Furbish, MD     Referring MD: Zachery Dauer, MD    History of Present Illness:    Alan Mcdonald is a 72 y.o. male here for the follow-up of CAD post CABG with aortic valve replacement September 2021 who was admitted with stroke in the fall 2022.  Acute ischemic stroke involving the left lentiform and caudate nucleus.  Was placed on Plavix 75 as well as increased aspirin 162.  Echocardiogram normal ejection fraction 65% with no other abnormalities.  On statin.  Previously had critical left main coronary artery disease and moderate aortic stenosis.  Previously went down to [place] for a large wedding.  He is from there.  His wife is as well.  She had lumbar spine surgery, February 2023.  Overall been doing quite well.  Blood pressure was elevated during stress test.  He was converted to [employer].  Low risk overall without any ischemia.  He is blood pressure medication was adjusted see below for medication list.  He is doing well currently.  No fevers chills nausea vomiting syncope bleeding.  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   LIMA to LAD SVG to OM    Past Surgical History:  Procedure Laterality Date   AORTIC VALVE REPLACEMENT N/A 02/22/2020   Procedure: AORTIC VALVE REPLACEMENT (AVR) USING EDWARDS Resilia 25 MM AORTIC VALVE.;  Surgeon: Rexene Alberts, MD;  Location: [place];  Service: Open Heart Surgery;  Laterality: N/A;   CARDIAC CATHETERIZATION     CERVICAL SPINE SURGERY     CHOLECYSTECTOMY     CORONARY ARTERY BYPASS GRAFT  N/A 02/22/2020   Procedure: CORONARY ARTERY BYPASS GRAFTING (CABG) USING LIMA to LAD; ENDSCOPICALLY HARVEST RIGHT GREATER SAPHENOUS VEIN: SVG to OM1;  Surgeon: Rexene Alberts, MD;  Location: [place];  Service: Open Heart Surgery;  Laterality: N/A;   ELBOW SURGERY     ENDOVEIN HARVEST OF GREATER SAPHENOUS VEIN Right 02/22/2020   Procedure: ENDOVEIN HARVEST OF GREATER SAPHENOUS VEIN;  Surgeon: Rexene Alberts, MD;  Location: [place];  Service: Open Heart Surgery;  Laterality: Right;   KIDNEY STONE SURGERY     LEFT HEART CATH AND CORONARY ANGIOGRAPHY N/A 02/22/2020   Procedure: LEFT HEART CATH AND CORONARY ANGIOGRAPHY;  Surgeon: Belva Crome, MD;  Location: [place] [facility];  Service: Cardiovascular;  Laterality: N/A;   TEE WITHOUT CARDIOVERSION N/A 02/22/2020   Procedure: TRANSESOPHAGEAL ECHOCARDIOGRAM (TEE);  Surgeon: Rexene Alberts, MD;  Location: [place];  Service: Open Heart Surgery;  Laterality: N/A;    Current Medications: Current Meds  Medication Sig   acetaminophen (TYLENOL) 500 MG tablet Take 500 mg by mouth every 6 (six) hours as needed for mild pain or moderate pain.   amLODipine (NORVASC) 5 MG tablet Take 5 mg by mouth daily.   aspirin EC 81 MG tablet Take 81 mg by mouth 2 (two) times daily. Swallow whole.   atorvastatin (LIPITOR) 80 MG tablet Take 1 tablet (80 mg  total) by mouth every evening.   B Complex Vitamins (VITAMIN B-COMPLEX) TABS Take 1 tablet by mouth daily.    carvedilol (COREG) 3.125 MG tablet Take 1 tablet (3.125 mg total) by mouth 2 (two) times daily with a meal.   cholecalciferol (VITAMIN D3) 25 MCG (1000 UNIT) tablet Take 1,000 Units by mouth daily.   clopidogrel (PLAVIX) 75 MG tablet Take 75 mg by mouth daily.   Coenzyme Q10 (CO Q-10) 200 MG CAPS Take 1 tablet by mouth daily.   ferrous sulfate 325 (65 FE) MG tablet Take 1 tablet by mouth 2 (two) times a week.   glucosamine-chondroitin 500-400 MG tablet Take 1 tablet by mouth daily.   hydrochlorothiazide  (HYDRODIURIL) 12.5 MG tablet Take 1 tablet (12.5 mg total) by mouth daily.   losartan (COZAAR) 100 MG tablet Take 1 tablet (100 mg total) by mouth daily.   Multiple Vitamin (MULTIVITAMIN WITH MINERALS) TABS tablet Take 1 tablet by mouth daily.   OVER THE COUNTER MEDICATION Take 500 mg by mouth daily. (GINGER EXTRACT)   OVER THE COUNTER MEDICATION Take 500 mg by mouth daily. (FENUGREEK)   RA KRILL OIL 500 MG CAPS Take 500 mg by mouth daily.   Turmeric 500 MG CAPS Take 500 mg by mouth daily.   Zinc 30 MG CAPS Take 30 mg by mouth daily.   Current Facility-Administered Medications for the 07/20/22 encounter (Office Visit) with Jerline Pain, MD  Medication   betamethasone acetate-betamethasone sodium phosphate (CELESTONE) injection 3 mg     Allergies:   Patient has no known allergies.   Social History   Socioeconomic History   Marital status: Married    Spouse name: Not on file   Number of children: Not on file   Years of education: 14   Highest education level: Associate degree: occupational, Hotel manager, or vocational program  Occupational History   Occupation: semi retired  Tobacco Use   Smoking status: Former    Packs/day: 0.50    Years: 25.00    Total pack years: 12.50    Types: Cigarettes    Quit date: 2003    Years since quitting: 21.1   Smokeless tobacco: Never  Vaping Use   Vaping Use: Never used  Substance and Sexual Activity   Alcohol use: Yes    Comment: occasional   Drug use: Never   Sexual activity: Not on file  Other Topics Concern   Not on file  Social History Narrative   Not on file   Social Determinants of Health   Financial Resource Strain: Not on file  Food Insecurity: Not on file  Transportation Needs: Not on file  Physical Activity: Not on file  Stress: Not on file  Social Connections: Not on file     Family History: The patient's  Younger brother died of heart disease. Quit smoking 20 years ago.   ROS:   Please see the history of present  illness.     All other systems reviewed and are negative.  EKGs/Labs/Other Studies Reviewed:    EKG 07/20/2022-sinus bradycardia 53 right bundle branch block.  04/2022: Nuclear stress test Unable to achieve target heart rate with exercise, Lexiscan was administered Markedly hypertensive with exercise, BP up to 240/92 Diffuse horizontal ST depressions with stress, but no evidence of of ischemia on myoview Fixed apical anterior perfusion defect with normal wall motion consistent with artifact Low risk study  Recent Labs: 06/06/2022: BUN 16; Creatinine, Ser 0.87; Potassium 3.8; Sodium 141  Recent Lipid Panel  Component Value Date/Time   CHOL 118 04/05/2021 1026   TRIG 79 04/05/2021 1026   HDL 38 (L) 04/05/2021 1026   CHOLHDL 3.1 04/05/2021 1026   CHOLHDL 4.1 02/22/2020 0208   VLDL 30 02/22/2020 0208   LDLCALC 64 04/05/2021 1026     Risk Assessment/Calculations:              Physical Exam:    VS:  BP (!) 108/56   Pulse (!) 53   Ht 5' 3"$  (1.6 m)   Wt 153 lb 9.6 oz (69.7 kg)   SpO2 95%   BMI 27.21 kg/m     Wt Readings from Last 3 Encounters:  07/20/22 153 lb 9.6 oz (69.7 kg)  05/07/22 154 lb (69.9 kg)  04/17/22 154 lb 3.2 oz (69.9 kg)     GEN:  Well nourished, well developed in no acute distress HEENT: Normal NECK: No JVD; No carotid bruits LYMPHATICS: No lymphadenopathy CARDIAC: RRR, no murmurs, no rubs, gallops, CABG scar well-healed RESPIRATORY:  Clear to auscultation without rales, wheezing or rhonchi  ABDOMEN: Soft, non-tender, non-distended MUSCULOSKELETAL:  No edema; No deformity  SKIN: Warm and dry NEUROLOGIC:  Alert and oriented x 3 PSYCHIATRIC:  Normal affect   ASSESSMENT:    1. Coronary artery disease involving native coronary artery of native heart without angina pectoris   2. Essential (primary) hypertension   3. S/P aortic valve replacement with bioprosthetic valve     PLAN:    In order of problems listed above:   Coronary artery  disease Overall doing very well without any anginal symptoms.  Continue with goal-directed medical therapy.  Has previously been on both aspirin and Plavix.  Plavix was reinitiated after stroke.  No changes.  He did have a stress test in 2023 which was overall low risk.  Blood pressure medications were changed after elevated blood pressure during exercise.  Cerebral thrombosis with cerebral infarction Neurology notes reviewed.  Plavix, aspirin. Small vessel. Had trouble filling out flu shot paper work. Next morning could hardly write. Went to [employer]. Brought in. No bleeding.  Stable.  No significant residual defect.  S/P aortic valve replacement with bioprosthetic valve Edwards bovine pericardial tissue valve 25 mm.  Dental antibiotics.  Doing well.  Recent echocardiogram reviewed.  Stable.  No changes made.  S/P CABG x 2 LIMA to LAD SVG to OM.  Stable.  Hyperlipidemia Currently on atorvastatin 80 mg a day.  Last LDL 64.  Excellent.  Hemoglobin A1c 5.1 hemoglobin 12.6 creatinine 0.7.  Outside labs reviewed. No changes. No myalgias.   Essential (primary) hypertension Currently taking amlodipine 5 mg a day as well as carvedilol 3.125 mg twice a day.  On losartan 100 mg a day.  Had previously messaged with lower blood pressures on 5 mg of amlodipine.  Lost 10 pounds. Working out. HCTZ 12.5 now.      Medication Adjustments/Labs and Tests Ordered: Current medicines are reviewed at length with the patient today.  Concerns regarding medicines are outlined above.  Orders Placed This Encounter  Procedures   EKG 12-Lead   No orders of the defined types were placed in this encounter.   Patient Instructions  Medication Instructions:  Your physician recommends that you continue on your current medications as directed. Please refer to the Current Medication list given to you today.  *If you need a refill on your cardiac medications before your next appointment, please call your  pharmacy*   Lab Work: None.  If you have  labs (blood work) drawn today and your tests are completely normal, you will receive your results only by: [place] (if you have MyChart) OR A paper copy in the mail If you have any lab test that is abnormal or we need to change your treatment, we will call you to review the results.   Testing/Procedures: None.   Follow-Up: At [facility], you and your health needs are our priority.  As part of our continuing mission to provide you with exceptional heart care, we have created designated Provider Care Teams.  These Care Teams include your primary Cardiologist (physician) and Advanced Practice Providers (APPs -  Physician Assistants and Nurse Practitioners) who all work together to provide you with the care you need, when you need it.   Your next appointment:   1 year(s)  Provider:   Candee Furbish, MD       Signed, Candee Furbish, MD  07/20/2022 10:23 AM    [place]

## 2022-10-03 ENCOUNTER — Ambulatory Visit: Payer: Medicare Other | Admitting: Neurology

## 2022-10-15 ENCOUNTER — Encounter: Payer: Self-pay | Admitting: Cardiology

## 2022-10-18 ENCOUNTER — Encounter: Payer: Self-pay | Admitting: Neurology

## 2022-10-18 ENCOUNTER — Telehealth: Payer: Self-pay | Admitting: *Deleted

## 2022-10-18 NOTE — Telephone Encounter (Signed)
   Pre-operative Risk Assessment    Patient Name: Alan Mcdonald  DOB: 1951/05/02 MRN: [id number]      Request for Surgical Clearance    Procedure:   RIGHT TOTAL KNEE ARTHROPLASTY  Date of Surgery:  Clearance 01/01/23                                 Surgeon:  DR. Fayrene Fearing COMADOLL Surgeon's Group or Practice Name:  [facility], [place], [place] Phone number:  [phone] EXT [phone] Dyke Brackett, RN Fax number:  [phone]   Type of Clearance Requested:   - Medical  - Pharmacy:  Hold Aspirin and Clopidogrel (Plavix)     Type of Anesthesia:  Not Indicated (GENERAL?)   Additional requests/questions:    Elpidio Anis   10/18/2022, 5:26 PM

## 2022-10-19 ENCOUNTER — Telehealth: Payer: Self-pay | Admitting: *Deleted

## 2022-10-19 NOTE — Telephone Encounter (Signed)
   Name: Alan Mcdonald  DOB: Sep 14, 1950  MRN: [id number]  Primary Cardiologist: Donato Schultz, MD  Chart reviewed as part of pre-operative protocol coverage. Because of Zayon Corvera's past medical history and time since last visit, he will require a follow-up telephone visit in order to better assess preoperative cardiovascular risk.  Pre-op covering staff: - Please schedule appointment and call patient to inform them. If patient already had an upcoming appointment within acceptable timeframe, please add "pre-op clearance" to the appointment notes so provider is aware. - Please contact requesting surgeon's office via preferred method (i.e, phone, fax) to inform them of need for appointment prior to surgery.  Taken off of Plavix after remote CABG.  Reinitiated for stroke.  Guidance on holding Plavix will need to come from prescribing physician.  Sharlene Dory, PA-C  10/19/2022, 7:46 AM

## 2022-10-19 NOTE — Telephone Encounter (Signed)
S/w pt and pt's spouse, schedule telephone clearance.     Patient Consent for Virtual Visit         Alan Mcdonald has provided verbal consent on 10/19/2022 for a virtual visit (video or telephone).   CONSENT FOR VIRTUAL VISIT FOR:  Alan Mcdonald  By participating in this virtual visit I agree to the following:  I hereby voluntarily request, consent and authorize [facility] and its employed or contracted physicians, physician assistants, nurse practitioners or other licensed health care professionals (the Practitioner), to provide me with telemedicine health care services (the "Services") as deemed necessary by the treating Practitioner. I acknowledge and consent to receive the Services by the Practitioner via telemedicine. I understand that the telemedicine visit will involve communicating with the Practitioner through live audiovisual communication technology and the disclosure of certain medical information by electronic transmission. I acknowledge that I have been given the opportunity to request an in-person assessment or other available alternative prior to the telemedicine visit and am voluntarily participating in the telemedicine visit.  I understand that I have the right to withhold or withdraw my consent to the use of telemedicine in the course of my care at any time, without affecting my right to future care or treatment, and that the Practitioner or I may terminate the telemedicine visit at any time. I understand that I have the right to inspect all information obtained and/or recorded in the course of the telemedicine visit and may receive copies of available information for a reasonable fee.  I understand that some of the potential risks of receiving the Services via telemedicine include:  Delay or interruption in medical evaluation due to technological equipment failure or disruption; Information transmitted may not be sufficient (e.g. poor resolution of images) to allow for  appropriate medical decision making by the Practitioner; and/or  In rare instances, security protocols could fail, causing a breach of personal health information.  Furthermore, I acknowledge that it is my responsibility to provide information about my medical history, conditions and care that is complete and accurate to the best of my ability. I acknowledge that Practitioner's advice, recommendations, and/or decision may be based on factors not within their control, such as incomplete or inaccurate data provided by me or distortions of diagnostic images or specimens that may result from electronic transmissions. I understand that the practice of medicine is not an exact science and that Practitioner makes no warranties or guarantees regarding treatment outcomes. I acknowledge that a copy of this consent can be made available to me via my patient portal [facility] MyChart), or I can request a printed copy by calling the office of [facility].    I understand that my insurance will be billed for this visit.   I have read or had this consent read to me. I understand the contents of this consent, which adequately explains the benefits and risks of the Services being provided via telemedicine.  I have been provided ample opportunity to ask questions regarding this consent and the Services and have had my questions answered to my satisfaction. I give my informed consent for the services to be provided through the use of telemedicine in my medical care

## 2022-10-22 ENCOUNTER — Telehealth: Payer: Self-pay

## 2022-10-22 NOTE — Telephone Encounter (Signed)
--  Clearance letter faxed to [phone] to Dyke Brackett, RN attn: Micael Hampshire

## 2022-11-14 ENCOUNTER — Telehealth: Payer: Medicare Other

## 2022-11-26 ENCOUNTER — Ambulatory Visit (INDEPENDENT_AMBULATORY_CARE_PROVIDER_SITE_OTHER): Payer: Medicare Other | Admitting: Neurology

## 2022-11-26 ENCOUNTER — Encounter: Payer: Self-pay | Admitting: Neurology

## 2022-11-26 ENCOUNTER — Encounter: Payer: Self-pay | Admitting: Gastroenterology

## 2022-11-26 VITALS — BP 128/69 | HR 52 | Ht 63.0 in | Wt 154.5 lb

## 2022-11-26 DIAGNOSIS — I6330 Cerebral infarction due to thrombosis of unspecified cerebral artery: Secondary | ICD-10-CM | POA: Diagnosis not present

## 2022-11-26 DIAGNOSIS — I10 Essential (primary) hypertension: Secondary | ICD-10-CM | POA: Diagnosis not present

## 2022-11-26 NOTE — Progress Notes (Signed)
[facility]  PATIENT: Alan Mcdonald DOB: 1950-07-04  REFERRING CLINICIAN: Greta Doom, MD HISTORY FROM: Patient and spouse  REASON FOR VISIT: Stroke follow up   HISTORICAL  CHIEF COMPLAINT:  Chief Complaint  Patient presents with   Follow-up    Rm12, wife present  Cva 1 year follow up: no problems/concerns   INTERVAL HISTORY 11/26/2022:  Patient presents today for follow-up, he is accompanied by wife. Last visit was a year ago and since then he has been doing well.  He is compliant with his medications.  He reports that his blood pressure is better managed since his PCP added hydrochlorothiazide.  Currently no complaints.  He is enjoying retirement and traveling around the [place].  His most recent carotid ultrasound done at the [place] was normal, did not show any large vessel stenosis.    INTERVAL HISTORY 09/27/2021:  Patient presents today for follow-up, he is accompanied by his wife, he is doing well since last visit, he is compliant with his medications and reports only issues that sometimes his blood pressure run high.  He is currently on Norvasc 2.5 mg daily, Coreg 3.125 mg twice daily and losartan 100 mg daily.  Denies any strokelike symptoms, no falls, no other question or concern.  He was recently diagnosed with iron deficiency anemia, started on iron supplement and is scheduled for a EGD and colonoscopy in June at the [place].  At last visit, I will check his lipid panel, showing an LDL of 64 and his CTA neck was completed showing no large vessel occlusion.  HISTORY OF PRESENT ILLNESS:  This is a 72 year old gentleman with past medical history of hypertension, hyperlipidemia, CAD status post CABG and aortic valve replacement last September 2021 previously on aspirin 81 mg and Plavix 75 mg for total of 1 year who was recently admitted in the hospital for acute stroke.  Patient presented to the hospital after period of confusion, feeling cloudy, slurred speech, and  difficulty writing.  He was found to have a acute ischemic stroke involving the left lentiform and caudate nucleus.  He did have the whole stroke work-up including MRI, MRA of the head and echocardiogram, a MRA or CTA of the neck was not performed.  He was seen with PT, did well and was discharged without any further outpatient physical therapy.  Plan at discharge was to continue Plavix 75 mg and to increase aspirin to 162 mg.  Since discharge he reported he is doing much better he continue on aspirin 162 mg and and Plavix 75 mg, no additional complaints.  He continues to exercise.   OTHER MEDICAL CONDITIONS: Hypertension, hyperlipidemia, CAD status post CABG and aortic valve replacement last September 2021 on aspirin and Plavix, left acoustic neuroma   REVIEW OF SYSTEMS: Full 14 system review of systems performed and negative with exception of: As noted in the HPI  ALLERGIES: No Known Allergies  HOME MEDICATIONS: Outpatient Medications Prior to Visit  Medication Sig Dispense Refill   acetaminophen (TYLENOL) 500 MG tablet Take 500 mg by mouth every 6 (six) hours as needed for mild pain or moderate pain.     amLODipine (NORVASC) 5 MG tablet Take 5 mg by mouth daily.     aspirin EC 81 MG tablet Take 81 mg by mouth 2 (two) times daily. Swallow whole.     atorvastatin (LIPITOR) 80 MG tablet Take 1 tablet (80 mg total) by mouth every evening. 30 tablet 1   B Complex Vitamins (VITAMIN B-COMPLEX) TABS  Take 1 tablet by mouth daily.      carvedilol (COREG) 3.125 MG tablet Take 1 tablet (3.125 mg total) by mouth 2 (two) times daily with a meal. 60 tablet 1   cholecalciferol (VITAMIN D3) 25 MCG (1000 UNIT) tablet Take 1,000 Units by mouth daily.     clopidogrel (PLAVIX) 75 MG tablet Take 75 mg by mouth daily.     Coenzyme Q10 (CO Q-10) 200 MG CAPS Take 1 tablet by mouth daily.     ferrous sulfate 325 (65 FE) MG tablet Take 1 tablet by mouth 2 (two) times a week.     glucosamine-chondroitin 500-400 MG  tablet Take 1 tablet by mouth daily.     hydrochlorothiazide (HYDRODIURIL) 12.5 MG tablet Take 1 tablet (12.5 mg total) by mouth daily. 30 tablet 11   losartan (COZAAR) 100 MG tablet Take 1 tablet (100 mg total) by mouth daily. 90 tablet 1   Multiple Vitamin (MULTIVITAMIN WITH MINERALS) TABS tablet Take 1 tablet by mouth daily.     OVER THE COUNTER MEDICATION Take 500 mg by mouth daily. (GINGER EXTRACT)     OVER THE COUNTER MEDICATION Take 500 mg by mouth daily. (FENUGREEK)     RA KRILL OIL 500 MG CAPS Take 500 mg by mouth daily.     Turmeric 500 MG CAPS Take 500 mg by mouth daily.     Zinc 30 MG CAPS Take 30 mg by mouth daily.     Facility-Administered Medications Prior to Visit  Medication Dose Route Frequency Provider Last Rate Last Admin   betamethasone acetate-betamethasone sodium phosphate (CELESTONE) injection 3 mg  3 mg Intra-articular Once Felecia Shelling, DPM        PAST MEDICAL HISTORY: 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   LIMA to LAD SVG to OM    PAST SURGICAL HISTORY: Past Surgical History:  Procedure Laterality Date   AORTIC VALVE REPLACEMENT N/A 02/22/2020   Procedure: AORTIC VALVE REPLACEMENT (AVR) USING EDWARDS Resilia 25 MM AORTIC VALVE.;  Surgeon: Purcell Nails, MD;  Location: [facility] OR;  Service: Open Heart Surgery;  Laterality: N/A;   CARDIAC CATHETERIZATION     CERVICAL SPINE SURGERY     CHOLECYSTECTOMY     CORONARY ARTERY BYPASS GRAFT N/A 02/22/2020   Procedure: CORONARY ARTERY BYPASS GRAFTING (CABG) USING LIMA to LAD; ENDSCOPICALLY HARVEST RIGHT GREATER SAPHENOUS VEIN: SVG to OM1;  Surgeon: Purcell Nails, MD;  Location: [facility] OR;  Service: Open Heart Surgery;  Laterality: N/A;    ELBOW SURGERY     ENDOVEIN HARVEST OF GREATER SAPHENOUS VEIN Right 02/22/2020   Procedure: ENDOVEIN HARVEST OF GREATER SAPHENOUS VEIN;  Surgeon: Purcell Nails, MD;  Location: [facility] OR;  Service: Open Heart Surgery;  Laterality: Right;   KIDNEY STONE SURGERY     LEFT HEART CATH AND CORONARY ANGIOGRAPHY N/A 02/22/2020   Procedure: LEFT HEART CATH AND CORONARY ANGIOGRAPHY;  Surgeon: Lyn Records, MD;  Location: [facility] INVASIVE CV LAB;  Service: Cardiovascular;  Laterality: N/A;   TEE WITHOUT CARDIOVERSION N/A 02/22/2020   Procedure: TRANSESOPHAGEAL ECHOCARDIOGRAM (TEE);  Surgeon: Purcell Nails, MD;  Location: [facility] OR;  Service: Open Heart Surgery;  Laterality: N/A;    FAMILY HISTORY: History reviewed. No pertinent family history.  SOCIAL HISTORY: Social History   Socioeconomic History   Marital status: Married    Spouse name: Not on file   Number of children: 2   Years of education: 14   Highest education level: Associate degree: occupational, Scientist, product/process development, or vocational program  Occupational History   Occupation: semi retired  Tobacco Use   Smoking status: Former    Packs/day: 0.50    Years: 25.00    Additional pack years: 0.00    Total pack years: 12.50    Types: Cigarettes    Quit date: 2003    Years since quitting: 21.4   Smokeless tobacco: Never  Vaping Use   Vaping Use: Never used  Substance and Sexual Activity   Alcohol use: Yes    Alcohol/week: 2.0 standard drinks of alcohol    Types: 2 Standard drinks or equivalent per week    Comment: occasional   Drug use: Never   Sexual activity: Yes    Birth control/protection: None  Other Topics Concern   Not on file  Social History Narrative   Not on file   Social Determinants of Health   Financial Resource Strain: Not on file  Food Insecurity: Not on file  Transportation Needs: Not on file  Physical Activity: Not on file  Stress: Not on file  Social Connections: Not on file  Intimate Partner Violence: Not on file      PHYSICAL EXAM  GENERAL EXAM/CONSTITUTIONAL: Vitals:  Vitals:   11/26/22 0927  BP: 128/69  Pulse: (!) 52  Weight: 154 lb 8 oz (70.1 kg)  Height: 5\' 3"  (1.6 m)    Body mass index is 27.37 kg/m. Wt Readings from Last 3 Encounters:  11/26/22 154 lb 8 oz (70.1 kg)  07/20/22 153 lb 9.6 oz (69.7 kg)  05/07/22 154 lb (69.9 kg)   Patient is in no distress; well developed, nourished and groomed; neck is supple  MUSCULOSKELETAL: Gait, strength, tone, movements noted in Neurologic exam below  NEUROLOGIC: MENTAL STATUS:      No data to display         awake, alert, oriented to person, place and time recent and remote memory intact normal attention and concentration language fluent, comprehension intact, naming intact fund of knowledge appropriate  CRANIAL NERVE:  2nd, 3rd, 4th, 6th - pupils equal and reactive to light, visual fields full to confrontation, extraocular muscles intact, no nystagmus 5th - facial sensation symmetric 7th - facial strength symmetric 8th - hearing intact 9th - palate elevates symmetrically, uvula midline 11th - shoulder shrug symmetric 12th - tongue protrusion midline  MOTOR:  normal bulk and tone, full strength in the BUE, BLE  SENSORY:  normal and symmetric to light touch  COORDINATION:  finger-nose-finger, fine finger movements normal  REFLEXES:  deep tendon reflexes decreased but symmetric   GAIT/STATION:  normal   DIAGNOSTIC DATA (LABS, IMAGING, TESTING) - I reviewed patient records, labs, notes, testing and imaging myself where available.  Lab Results  Component Value Date   WBC 6.3 02/17/2021   HGB 12.6 (L) 02/17/2021   HCT 37.0 (L) 02/17/2021   MCV 93.0 02/17/2021   PLT 98 (L) 02/17/2021      Component Value Date/Time   NA 141 06/06/2022 1058   K 3.8 06/06/2022 1058   CL 102 06/06/2022 1058   CO2 24 06/06/2022 1058   GLUCOSE 163 (H) 06/06/2022 1058   GLUCOSE 139 (H) 02/17/2021 1046  BUN 16 06/06/2022 1058    CREATININE 0.87 06/06/2022 1058   CALCIUM 8.9 06/06/2022 1058   PROT 5.8 (L) 02/17/2021 1033   PROT 6.2 04/28/2020 0851   ALBUMIN 3.8 02/17/2021 1033   ALBUMIN 4.3 04/28/2020 0851   AST 22 02/17/2021 1033   ALT 18 02/17/2021 1033   ALKPHOS 78 02/17/2021 1033   BILITOT 1.1 02/17/2021 1033   BILITOT 0.5 04/28/2020 0851   GFRNONAA >60 02/17/2021 1033   GFRAA 105 04/28/2020 0851   Lab Results  Component Value Date   CHOL 118 04/05/2021   HDL 38 (L) 04/05/2021   LDLCALC 64 04/05/2021   TRIG 79 04/05/2021   CHOLHDL 3.1 04/05/2021   Lab Results  Component Value Date   HGBA1C 5.1 02/18/2021   No results found for: "VITAMINB12" No results found for: "TSH"   MRI HEAD IMPRESSION: 1. 2 cm acute ischemic nonhemorrhagic perforator type infarct involving the left lentiform and caudate nuclei. 2. Additional punctate acute ischemic nonhemorrhagic infarct inferiorly adjacent to the anterior commissure. 3. Chronic left MCA distribution infarct, with additional chronic hemorrhagic lacunar infarct involving the right frontal corona radiata. 4. Underlying mild chronic microvascular ischemic disease. 5. 4 mm nodular focus within the left IAC, which could reflect a small intracanalicular lesion/mass, possibly a schwannoma. Nonemergent follow-up examination with dedicated IAC protocol MRI of the brain suggested for further evaluation.   MRA HEAD IMPRESSION: 1. Negative intracranial MRA for large vessel occlusion. 2. Single short-segment severe distal left M3 stenosis. 3. Otherwise negative intracranial MRA. No other proximal high-grade or correctable stenosis identified.   CTA neck 04/14/2021 1. No emergent large vessel occlusion or hemodynamically significant stenosis of the carotid or vertebral arteries.   Echocardiogram 1. Left ventricular ejection fraction, by estimation, is 60 to 65%. The  left ventricle has normal function. The left ventricle has no regional wall motion abnormalities.  There is mild left ventricular hypertrophy. Left ventricular diastolic parameters are indeterminate.   2. Right ventricular systolic function is normal. The right ventricular size is normal. Tricuspid regurgitation signal is inadequate for assessing PA pressure.   3. The mitral valve is normal in structure. No evidence of mitral valve regurgitation. No evidence of mitral stenosis.   4. The aortic valve has been repaired/replaced. Aortic valve regurgitation is not visualized. No aortic stenosis is present. There is a bioprosthetic valve present in the aortic position.   5. The inferior vena cava is normal in size with greater than 50% respiratory variability, suggesting right atrial pressure of 3 mmHg.  ASSESSMENT AND PLAN  72 y.o. year old male with vascular risk factors including hypertension, hyperlipidemia, CAD status post CABG who was found to have a left lentiform and caudate nuclei strokes here for follow up.  Overall he is doing well,  no current complaints, he is compliant with his medications, exercising and sleep is good.  Continue other medications, I will see him in 1 year for follow-up.    1. Cerebral thrombosis with cerebral infarction   2. Essential (primary) hypertension      Patient Instructions  Continue current medications  Follow up in a year    No orders of the defined types were placed in this encounter.   No orders of the defined types were placed in this encounter.    Return in about 1 year (around 11/26/2023).  I have spent a total of 30 minutes dedicated to this patient today, preparing to see patient, performing a medically appropriate examination and evaluation, ordering tests  and/or medications and procedures, and counseling and educating the patient/family/caregiver; independently interpreting result and communicating results to the family/patient/caregiver; and documenting clinical information in the electronic medical record.   Windell Norfolk, MD  11/26/2022, 10:39 AM  [facility] [address] [place], [place] [address] [phone]

## 2022-11-26 NOTE — Patient Instructions (Signed)
Continue current medications  °Follow up in a year  °

## 2023-01-17 ENCOUNTER — Ambulatory Visit: Payer: Medicare Other | Admitting: Gastroenterology

## 2023-04-13 ENCOUNTER — Encounter: Payer: Self-pay | Admitting: Cardiology

## 2023-04-15 MED ORDER — CARVEDILOL 3.125 MG PO TABS: 3.1250 mg | ORAL_TABLET | Freq: Two times a day (BID) | ORAL | 1 refills | Status: AC

## 2023-08-09 ENCOUNTER — Encounter: Payer: Self-pay | Admitting: Cardiology

## 2023-08-09 ENCOUNTER — Ambulatory Visit: Payer: Medicare Other | Attending: Cardiology | Admitting: Cardiology

## 2023-08-09 VITALS — BP 138/62 | HR 52 | Ht 63.0 in | Wt 154.4 lb

## 2023-08-09 DIAGNOSIS — I2510 Atherosclerotic heart disease of native coronary artery without angina pectoris: Secondary | ICD-10-CM | POA: Insufficient documentation

## 2023-08-09 DIAGNOSIS — Z953 Presence of xenogenic heart valve: Secondary | ICD-10-CM | POA: Insufficient documentation

## 2023-08-09 NOTE — Progress Notes (Signed)
 Cardiology Office Note:  .   Date:  08/09/2023  ID:  Alan Mcdonald, DOB [birth year], MRN [id number] PCP: Greta Doom, MD  [facility] Providers Cardiologist:  Donato Schultz, MD    History of Present Illness: .   Alan Mcdonald is a 73 y.o. male Discussed the use of AI scribe software for clinical note transcription with the patient, who gave verbal consent to proceed.  History of Present Illness Alan Lovick "Smitty" is a 73 year old male with coronary artery disease status post CABG and aortic valve replacement who presents for follow-up.  He underwent coronary artery bypass grafting (CABG) in September 2021, along with an aortic valve replacement using a 25 mm Edwards bovine pericardial tissue valve. His CABG involved LIMA to SVG and SVG to OM, which has been stable without any internal symptoms. No chest pain, fevers, chills, or nausea.  He has a history of stroke in the fall of 2022, for which he was placed on Plavix and his aspirin dose was increased to 162 mg daily. He continues to take Plavix and aspirin due to his prior stroke. No new neurological symptoms reported.  His blood pressure is well-controlled with amlodipine 5 mg once daily, carvedilol 3.125 mg twice daily, and hydrochlorothiazide 12.5 mg once daily. For hyperlipidemia, he takes atorvastatin 80 mg daily. His recent hemoglobin level is 14.1, and his prior LDL was 64.  He maintains an active lifestyle, exercising five days a week, walking a mile each time, and engaging in yard work. He is mindful of his weight and reports no chest pain during these activities.  He mentions a discrepancy in his medication, where he was switched from a tablespoon of fiber daily to a fiber pill, which he takes every other day or every two to three days.  From [place]     Studies Reviewed: Marland Kitchen   EKG Interpretation Date/Time:  Friday August 09 2023 11:33:28 EST Ventricular Rate:  52 PR Interval:  164 QRS Duration:  138 QT  Interval:  458 QTC Calculation: 425 R Axis:   98  Text Interpretation: Sinus bradycardia Right bundle branch block When compared with ECG of 07-May-2022 09:07, T wave inversion no longer evident in Inferior leads Confirmed by Donato Schultz ([id number]) on 08/09/2023 11:35:25 AM    Results LABS Hb: 14.1 LDL: 64  DIAGNOSTIC Echocardiogram: Normal ejection fraction of 65% (Fall 2022) Risk Assessment/Calculations:            Physical Exam:   VS:  BP 138/62   Pulse (!) 52   Ht 5\' 3"  (1.6 m)   Wt 154 lb 6.4 oz (70 kg)   SpO2 96%   BMI 27.35 kg/m    Wt Readings from Last 3 Encounters:  08/09/23 154 lb 6.4 oz (70 kg)  11/26/22 154 lb 8 oz (70.1 kg)  07/20/22 153 lb 9.6 oz (69.7 kg)    GEN: Well nourished, well developed in no acute distress NECK: No JVD; No carotid bruits CARDIAC: RRR, no murmurs, no rubs, no gallops RESPIRATORY:  Clear to auscultation without rales, wheezing or rhonchi  ABDOMEN: Soft, non-tender, non-distended EXTREMITIES:  No edema; No deformity   ASSESSMENT AND PLAN: .    Assessment and Plan Assessment & Plan Coronary Artery Disease (CAD) status post CABG and Aortic Valve Replacement 73 year old male with CAD status post CABG (September 2021) and aortic valve replacement (25 mm Edwards bovine pericardial tissue valve). Well-managed on dual antiplatelet therapy (Plavix and aspirin) due to prior stroke (fall  2022). Blood pressure is well-controlled. Echocardiogram shows normal ejection fraction (65%). Informed about dental antibiotic prophylaxis to protect the valve. Discussed benefits of continued dual antiplatelet therapy to prevent further cardiovascular events. - Continue Plavix and aspirin - Continue amlodipine 5 mg once daily - Continue carvedilol 3.125 mg twice daily - Continue hydrochlorothiazide 12.5 mg once daily - Continue atorvastatin 80 mg daily - Recheck echocardiogram at a later date - Ensure dental antibiotic prophylaxis with amoxicillin before  dental procedures  Stroke Stroke in fall 2022. On Plavix and increased aspirin (162 mg/day) for secondary prevention. No new neurological symptoms. Discussed importance of adherence to antiplatelet therapy to reduce recurrent stroke risk. - Continue Plavix and aspirin  Hypertension Blood pressure well-controlled with current regimen. Monitors blood pressure regularly at home. Discussed importance of maintaining blood pressure control to prevent cardiovascular complications. - Continue current antihypertensive regimen (amlodipine, carvedilol, hydrochlorothiazide)  Hyperlipidemia On atorvastatin 80 mg daily. Recent LDL was 64, indicating good control. Discussed benefits of maintaining LDL levels to reduce cardiovascular risk. - Continue atorvastatin 80 mg daily  General Health Maintenance Maintaining an active lifestyle, exercising five days a week, and monitoring weight. No chest pain or other concerning symptoms. Encouraged to continue physical activity and weight management to support cardiovascular health. - Encourage continued physical activity and weight management  Follow-up - Schedule follow-up appointment in one year.         Signed, Donato Schultz, MD

## 2023-08-09 NOTE — Patient Instructions (Signed)
 Medication Instructions:  The current medical regimen is effective;  continue present plan and medications.  *If you need a refill on your cardiac medications before your next appointment, please call your pharmacy*  Follow-Up: At [facility], you and your health needs are our priority.  As part of our continuing mission to provide you with exceptional heart care, we have created designated Provider Care Teams.  These Care Teams include your primary Cardiologist (physician) and Advanced Practice Providers (APPs -  Physician Assistants and Nurse Practitioners) who all work together to provide you with the care you need, when you need it.  We recommend signing up for the patient portal called "MyChart".  Sign up information is provided on this After Visit Summary.  MyChart is used to connect with patients for Virtual Visits (Telemedicine).  Patients are able to view lab/test results, encounter notes, upcoming appointments, etc.  Non-urgent messages can be sent to your provider as well.   To learn more about what you can do with MyChart, go to [email or web address].    Your next appointment:   1 year(s)  Provider:   Donato Schultz, MD           1st Floor: - Lobby - Registration  - Pharmacy  - Lab - Cafe  2nd Floor: - PV Lab - Diagnostic Testing (echo, CT, nuclear med)  3rd Floor: - Vacant  4th Floor: - TCTS (cardiothoracic surgery) - AFib Clinic - Structural Heart Clinic - Vascular Surgery  - Vascular Ultrasound  5th Floor: - HeartCare Cardiology (general and EP) - Clinical Pharmacy for coumadin, hypertension, lipid, weight-loss medications, and med management appointments    Valet parking services will be available as well.

## 2023-11-20 ENCOUNTER — Telehealth: Payer: Self-pay | Admitting: Neurology

## 2023-11-20 NOTE — Telephone Encounter (Signed)
 Request to r/s appointment , and have pt on wait list

## 2023-11-27 ENCOUNTER — Ambulatory Visit: Payer: Medicare Other | Admitting: Neurology

## 2023-12-02 ENCOUNTER — Telehealth: Payer: Self-pay | Admitting: Neurology

## 2023-12-02 NOTE — Telephone Encounter (Signed)
 Wife Request for an earlier appointment

## 2024-01-22 ENCOUNTER — Ambulatory Visit: Admitting: Neurology

## 2024-01-22 ENCOUNTER — Encounter: Payer: Self-pay | Admitting: Neurology

## 2024-01-22 VITALS — BP 117/69 | HR 71 | Ht 62.0 in | Wt 151.5 lb

## 2024-01-22 DIAGNOSIS — M170 Bilateral primary osteoarthritis of knee: Secondary | ICD-10-CM | POA: Insufficient documentation

## 2024-01-22 DIAGNOSIS — I63519 Cerebral infarction due to unspecified occlusion or stenosis of unspecified middle cerebral artery: Secondary | ICD-10-CM | POA: Insufficient documentation

## 2024-01-22 DIAGNOSIS — K642 Third degree hemorrhoids: Secondary | ICD-10-CM | POA: Insufficient documentation

## 2024-01-22 DIAGNOSIS — M25549 Pain in joints of unspecified hand: Secondary | ICD-10-CM | POA: Insufficient documentation

## 2024-01-22 DIAGNOSIS — M75122 Complete rotator cuff tear or rupture of left shoulder, not specified as traumatic: Secondary | ICD-10-CM | POA: Insufficient documentation

## 2024-01-22 DIAGNOSIS — Z760 Encounter for issue of repeat prescription: Secondary | ICD-10-CM | POA: Insufficient documentation

## 2024-01-22 DIAGNOSIS — I639 Cerebral infarction, unspecified: Secondary | ICD-10-CM | POA: Insufficient documentation

## 2024-01-22 DIAGNOSIS — I6330 Cerebral infarction due to thrombosis of unspecified cerebral artery: Secondary | ICD-10-CM | POA: Diagnosis not present

## 2024-01-22 DIAGNOSIS — D509 Iron deficiency anemia, unspecified: Secondary | ICD-10-CM | POA: Insufficient documentation

## 2024-01-22 DIAGNOSIS — M25512 Pain in left shoulder: Secondary | ICD-10-CM | POA: Insufficient documentation

## 2024-01-22 DIAGNOSIS — R197 Diarrhea, unspecified: Secondary | ICD-10-CM | POA: Insufficient documentation

## 2024-01-22 DIAGNOSIS — Z7729 Contact with and (suspected ) exposure to other hazardous substances: Secondary | ICD-10-CM | POA: Insufficient documentation

## 2024-01-22 DIAGNOSIS — M542 Cervicalgia: Secondary | ICD-10-CM | POA: Insufficient documentation

## 2024-01-22 DIAGNOSIS — H04123 Dry eye syndrome of bilateral lacrimal glands: Secondary | ICD-10-CM | POA: Insufficient documentation

## 2024-01-22 DIAGNOSIS — H905 Unspecified sensorineural hearing loss: Secondary | ICD-10-CM | POA: Insufficient documentation

## 2024-01-22 DIAGNOSIS — K648 Other hemorrhoids: Secondary | ICD-10-CM | POA: Insufficient documentation

## 2024-01-22 DIAGNOSIS — M5080 Other cervical disc disorders, unspecified cervical region: Secondary | ICD-10-CM | POA: Insufficient documentation

## 2024-01-22 DIAGNOSIS — Z7902 Long term (current) use of antithrombotics/antiplatelets: Secondary | ICD-10-CM | POA: Insufficient documentation

## 2024-01-22 DIAGNOSIS — G4733 Obstructive sleep apnea (adult) (pediatric): Secondary | ICD-10-CM | POA: Insufficient documentation

## 2024-01-22 DIAGNOSIS — I350 Nonrheumatic aortic (valve) stenosis: Secondary | ICD-10-CM | POA: Insufficient documentation

## 2024-01-22 NOTE — Patient Instructions (Signed)
 Continue current medications Continue follow-up PCP Return in a year or sooner if worse.

## 2024-01-22 NOTE — Progress Notes (Signed)
 [facility]  PATIENT: Alan Mcdonald DOB: 1951-02-15  REFERRING CLINICIAN: Virgia Dorothe BRAVO, MD HISTORY FROM: Patient and spouse  REASON FOR VISIT: Stroke follow up   HISTORICAL  CHIEF COMPLAINT:  Chief Complaint  Patient presents with   RM12/CVA    Pt is here with his Wife. Pt states that he is having back and neck pain. Pt states that he has some memory issues since his last appointment.     INTERVAL HISTORY 01/22/2024 Patient presents today for follow-up, last visit was a year ago, since then he has been doing well, he is compliant with his medications including Lipitor  and aspirin .  He tells me that recently he was diagnosed with sleep apnea, but has not been started on the CPAP machine.  Also reports having a recent carotid US  at the [facility] and was told that everything was normal.  Overall he feels well, reports recently some neck pain, had a MRI neck pending results.  Denies any recent falls.   INTERVAL HISTORY 11/26/2022:  Patient presents today for follow-up, he is accompanied by wife. Last visit was a year ago and since then he has been doing well.  He is compliant with his medications.  He reports that his blood pressure is better managed since his PCP added hydrochlorothiazide .  Currently no complaints.  He is enjoying retirement and traveling around the [place] .  His most recent carotid ultrasound done at the [facility] was normal, did not show any large vessel stenosis.    INTERVAL HISTORY 09/27/2021:  Patient presents today for follow-up, he is accompanied by his wife, he is doing well since last visit, he is compliant with his medications and reports only issues that sometimes his blood pressure run high.  He is currently on Norvasc  2.5 mg daily, Coreg  3.125 mg twice daily and losartan  100 mg daily.  Denies any strokelike symptoms, no falls, no other question or concern.  He was recently diagnosed with iron deficiency anemia, started on iron supplement and is scheduled for a  EGD and colonoscopy in June at the [facility].  At last visit, I will check his lipid panel, showing an LDL of 64 and his CTA neck was completed showing no large vessel occlusion.  HISTORY OF PRESENT ILLNESS:  This is a 73 year old gentleman with past medical history of hypertension, hyperlipidemia, CAD status post CABG and aortic valve replacement last September 2021 previously on aspirin  81 mg and Plavix  75 mg for total of 1 year who was recently admitted in the hospital for acute stroke.  Patient presented to the hospital after period of confusion, feeling cloudy, slurred speech, and difficulty writing.  He was found to have a acute ischemic stroke involving the left lentiform and caudate nucleus.  He did have the whole stroke work-up including MRI, MRA of the head and echocardiogram, a MRA or CTA of the neck was not performed.  He was seen with PT, did well and was discharged without any further outpatient physical therapy.  Plan at discharge was to continue Plavix  75 mg and to increase aspirin  to 162 mg.  Since discharge he reported he is doing much better he continue on aspirin  162 mg and and Plavix  75 mg, no additional complaints.  He continues to exercise.   OTHER MEDICAL CONDITIONS: Hypertension, hyperlipidemia, CAD status post CABG and aortic valve replacement last September 2021 on aspirin  and Plavix , left acoustic neuroma   REVIEW OF SYSTEMS: Full 14 system review of systems performed and negative with exception of:  As noted in the HPI  ALLERGIES: No Known Allergies  HOME MEDICATIONS: Outpatient Medications Prior to Visit  Medication Sig Dispense Refill   acetaminophen  (TYLENOL ) 500 MG tablet Take 500 mg by mouth every 6 (six) hours as needed for mild pain or moderate pain.     amLODipine  (NORVASC ) 5 MG tablet Take 5 mg by mouth daily.     aspirin  EC 81 MG tablet Take 81 mg by mouth 2 (two) times daily. Swallow whole.     atorvastatin  (LIPITOR ) 80 MG tablet Take 1 tablet (80 mg total) by mouth  every evening. 30 tablet 1   B Complex Vitamins (VITAMIN B-COMPLEX) TABS Take 1 tablet by mouth daily.      carvedilol  (COREG ) 3.125 MG tablet Take 1 tablet (3.125 mg total) by mouth 2 (two) times daily with a meal. 180 tablet 1   cholecalciferol (VITAMIN D3) 25 MCG (1000 UNIT) tablet Take 1,000 Units by mouth daily.     clopidogrel  (PLAVIX ) 75 MG tablet Take 75 mg by mouth daily.     Coenzyme Q10 (CO Q-10) 200 MG CAPS Take 1 tablet by mouth daily.     glucosamine-chondroitin 500-400 MG tablet Take 1 tablet by mouth daily.     hydrochlorothiazide  (HYDRODIURIL ) 12.5 MG tablet Take 1 tablet (12.5 mg total) by mouth daily. 30 tablet 11   losartan  (COZAAR ) 100 MG tablet Take 1 tablet (100 mg total) by mouth daily. 90 tablet 1   Multiple Vitamin (MULTIVITAMIN WITH MINERALS) TABS tablet Take 1 tablet by mouth daily.     OVER THE COUNTER MEDICATION Take 500 mg by mouth daily. (GINGER EXTRACT)     OVER THE COUNTER MEDICATION Take 500 mg by mouth daily. (FENUGREEK)     Probiotic Product (PROBIOTIC DAILY PO) Take by mouth.     RA KRILL OIL 500 MG CAPS Take 500 mg by mouth daily.     Turmeric 500 MG CAPS Take 500 mg by mouth daily.     Zinc 30 MG CAPS Take 30 mg by mouth daily.     Calcium  Polycarbophil (FIBER) 625 MG TABS Take 1 tablet by mouth daily. (Patient not taking: Reported on 01/22/2024)     ferrous sulfate 325 (65 FE) MG tablet Take 1 tablet by mouth 2 (two) times a week. (Patient not taking: Reported on 01/22/2024)     Facility-Administered Medications Prior to Visit  Medication Dose Route Frequency Provider Last Rate Last Admin   betamethasone  acetate-betamethasone  sodium phosphate (CELESTONE ) injection 3 mg  3 mg Intra-articular Once Alan Mcdonald, DPM        PAST MEDICAL HISTORY: Past Medical History:  Diagnosis Date   Aortic stenosis 02/22/2020   Coronary artery disease    High cholesterol    History of lymphoma    Hypertension    S/P aortic valve replacement with bioprosthetic  valve 02/22/2020   Edwards Inspiris Resilia stented bovine pericardial tissue valve, size 25 mm   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   LIMA to LAD SVG to OM    PAST SURGICAL HISTORY: Past Surgical History:  Procedure Laterality Date   AORTIC VALVE REPLACEMENT N/A 02/22/2020   Procedure: AORTIC VALVE REPLACEMENT (AVR) USING EDWARDS Resilia 25 MM AORTIC VALVE.;  Surgeon: Dusty Sudie DEL, MD;  Location: [facility] OR;  Service: Open Heart Surgery;  Laterality: N/A;   CARDIAC CATHETERIZATION     CERVICAL SPINE SURGERY     CHOLECYSTECTOMY     CORONARY ARTERY BYPASS GRAFT N/A 02/22/2020   Procedure:  CORONARY ARTERY BYPASS GRAFTING (CABG) USING LIMA to LAD; ENDSCOPICALLY HARVEST RIGHT GREATER SAPHENOUS VEIN: SVG to OM1;  Surgeon: Dusty Sudie DEL, MD;  Location: [facility] OR;  Service: Open Heart Surgery;  Laterality: N/A;   ELBOW SURGERY     ENDOVEIN HARVEST OF GREATER SAPHENOUS VEIN Right 02/22/2020   Procedure: ENDOVEIN HARVEST OF GREATER SAPHENOUS VEIN;  Surgeon: Dusty Sudie DEL, MD;  Location: [facility] OR;  Service: Open Heart Surgery;  Laterality: Right;   KIDNEY STONE SURGERY     LEFT HEART CATH AND CORONARY ANGIOGRAPHY N/A 02/22/2020   Procedure: LEFT HEART CATH AND CORONARY ANGIOGRAPHY;  Surgeon: Claudene Victory ORN, MD;  Location: [facility] INVASIVE CV LAB;  Service: Cardiovascular;  Laterality: N/A;   TEE WITHOUT CARDIOVERSION N/A 02/22/2020   Procedure: TRANSESOPHAGEAL ECHOCARDIOGRAM (TEE);  Surgeon: Dusty Sudie DEL, MD;  Location: [facility] OR;  Service: Open Heart Surgery;  Laterality: N/A;    FAMILY HISTORY: History reviewed. No pertinent family history.  SOCIAL HISTORY: Social History   Socioeconomic History   Marital status: Married    Spouse name: Not on file   Number of children: 2   Years of education: 14   Highest education level: Associate degree: occupational, Scientist, product/process development, or vocational program  Occupational History   Occupation: semi retired  Tobacco Use   Smoking status: Former    Current packs/day: 0.00     Average packs/day: 0.5 packs/day for 25.0 years (12.5 ttl pk-yrs)    Types: Cigarettes    Start date: 64    Quit date: 2003    Years since quitting: 22.6   Smokeless tobacco: Never  Vaping Use   Vaping status: Never Used  Substance and Sexual Activity   Alcohol use: Yes    Alcohol/week: 2.0 standard drinks of alcohol    Types: 2 Standard drinks or equivalent per week    Comment: occasional   Drug use: Never   Sexual activity: Yes    Birth control/protection: None  Other Topics Concern   Not on file  Social History Narrative   Not on file   Social Drivers of Health   Financial Resource Strain: Not on file  Food Insecurity: Not on file  Transportation Needs: Not on file  Physical Activity: Not on file  Stress: Not on file  Social Connections: Unknown (11/30/2021)   Received from [facility]   Social Network    Social Network: Not on file  Intimate Partner Violence: Unknown (11/30/2021)   Received from [facility]   HITS    Physically Hurt: Not on file    Insult or Talk Down To: Not on file    Threaten Physical Harm: Not on file    Scream or Curse: Not on file     PHYSICAL EXAM  GENERAL EXAM/CONSTITUTIONAL: Vitals:  Vitals:   01/22/24 1304  BP: 117/69  Pulse: 71  SpO2: 98%  Weight: 151 lb 8 oz (68.7 kg)  Height: 5' 2 (1.575 Mcdonald)     Body mass index is 27.71 kg/Mcdonald. Wt Readings from Last 3 Encounters:  01/22/24 151 lb 8 oz (68.7 kg)  08/09/23 154 lb 6.4 oz (70 kg)  11/26/22 154 lb 8 oz (70.1 kg)   Patient is in no distress; well developed, nourished and groomed; neck is supple  MUSCULOSKELETAL: Gait, strength, tone, movements noted in Neurologic exam below  NEUROLOGIC: MENTAL STATUS:      No data to display         awake, alert, oriented to person, place and time  recent and remote memory intact normal attention and concentration language fluent, comprehension intact, naming intact fund of knowledge appropriate  CRANIAL NERVE:  2nd, 3rd,  4th, 6th - pupils equal and reactive to light, visual fields full to confrontation, extraocular muscles intact, no nystagmus 5th - facial sensation symmetric 7th - facial strength symmetric 8th - hearing intact 9th - palate elevates symmetrically, uvula midline 11th - shoulder shrug symmetric 12th - tongue protrusion midline  MOTOR:  normal bulk and tone, full strength in the BUE, BLE. Limited cervical ROM with pain   SENSORY:  normal and symmetric to light touch  COORDINATION:  finger-nose-finger, fine finger movements normal  REFLEXES:  deep tendon reflexes decreased but symmetric   GAIT/STATION:  normal   DIAGNOSTIC DATA (LABS, IMAGING, TESTING) - I reviewed patient records, labs, notes, testing and imaging myself where available.  Lab Results  Component Value Date   WBC 6.3 02/17/2021   HGB 12.6 (L) 02/17/2021   HCT 37.0 (L) 02/17/2021   MCV 93.0 02/17/2021   PLT 98 (L) 02/17/2021      Component Value Date/Time   NA 141 06/06/2022 1058   K 3.8 06/06/2022 1058   CL 102 06/06/2022 1058   CO2 24 06/06/2022 1058   GLUCOSE 163 (H) 06/06/2022 1058   GLUCOSE 139 (H) 02/17/2021 1046   BUN 16 06/06/2022 1058   CREATININE 0.87 06/06/2022 1058   CALCIUM  8.9 06/06/2022 1058   PROT 5.8 (L) 02/17/2021 1033   PROT 6.2 04/28/2020 0851   ALBUMIN  3.8 02/17/2021 1033   ALBUMIN  4.3 04/28/2020 0851   AST 22 02/17/2021 1033   ALT 18 02/17/2021 1033   ALKPHOS 78 02/17/2021 1033   BILITOT 1.1 02/17/2021 1033   BILITOT 0.5 04/28/2020 0851   GFRNONAA >60 02/17/2021 1033   GFRAA 105 04/28/2020 0851   Lab Results  Component Value Date   CHOL 118 04/05/2021   HDL 38 (L) 04/05/2021   LDLCALC 64 04/05/2021   TRIG 79 04/05/2021   CHOLHDL 3.1 04/05/2021   Lab Results  Component Value Date   HGBA1C 5.1 02/18/2021   No results found for: VITAMINB12 No results found for: TSH   MRI HEAD IMPRESSION: 1. 2 cm acute ischemic nonhemorrhagic perforator type infarct involving  the left lentiform and caudate nuclei. 2. Additional punctate acute ischemic nonhemorrhagic infarct inferiorly adjacent to the anterior commissure. 3. Chronic left MCA distribution infarct, with additional chronic hemorrhagic lacunar infarct involving the right frontal corona radiata. 4. Underlying mild chronic microvascular ischemic disease. 5. 4 mm nodular focus within the left IAC, which could reflect a small intracanalicular lesion/mass, possibly a schwannoma. Nonemergent follow-up examination with dedicated IAC protocol MRI of the brain suggested for further evaluation.   MRA HEAD IMPRESSION: 1. Negative intracranial MRA for large vessel occlusion. 2. Single short-segment severe distal left M3 stenosis. 3. Otherwise negative intracranial MRA. No other proximal high-grade or correctable stenosis identified.   CTA neck 04/14/2021 1. No emergent large vessel occlusion or hemodynamically significant stenosis of the carotid or vertebral arteries.   Echocardiogram 1. Left ventricular ejection fraction, by estimation, is 60 to 65%. The  left ventricle has normal function. The left ventricle has no regional wall motion abnormalities. There is mild left ventricular hypertrophy. Left ventricular diastolic parameters are indeterminate.   2. Right ventricular systolic function is normal. The right ventricular size is normal. Tricuspid regurgitation signal is inadequate for assessing PA pressure.   3. The mitral valve is normal in structure. No evidence of mitral  valve regurgitation. No evidence of mitral stenosis.   4. The aortic valve has been repaired/replaced. Aortic valve regurgitation is not visualized. No aortic stenosis is present. There is a bioprosthetic valve present in the aortic position.   5. The inferior vena cava is normal in size with greater than 50% respiratory variability, suggesting right atrial pressure of 3 mmHg.  ASSESSMENT AND PLAN  73 y.o. year old male with vascular risk  factors including hypertension, hyperlipidemia, CAD status post CABG who was found to have a left lentiform and caudate nuclei strokes here for follow up.  Overall he is doing well,  no current complaints, he is compliant with his medications, exercising and sleep is good.  Continue other medications, and follow up with your doctor regarding the neck pain. I will see him in a year for follow up.     1. Cerebral thrombosis with cerebral infarction   2. Cervicalgia       Patient Instructions  Continue current medications Continue follow-up PCP Return in a year or sooner if worse.   No orders of the defined types were placed in this encounter.   No orders of the defined types were placed in this encounter.    Return in about 1 year (around 01/21/2025).    Pastor Falling, MD 01/22/2024, 1:47 PM  [facility] [address] [place], [place] [address] [phone]

## 2024-02-06 ENCOUNTER — Other Ambulatory Visit: Payer: Self-pay

## 2024-02-06 ENCOUNTER — Ambulatory Visit

## 2024-02-06 DIAGNOSIS — R293 Abnormal posture: Secondary | ICD-10-CM | POA: Diagnosis present

## 2024-02-06 DIAGNOSIS — M5412 Radiculopathy, cervical region: Secondary | ICD-10-CM | POA: Diagnosis present

## 2024-02-06 NOTE — Therapy (Signed)
 OUTPATIENT PHYSICAL THERAPY CERVICAL EVALUATION   Patient Name: Alan Mcdonald MRN: [id number] DOB:Jun 01, 1951, 73 y.o., male Today's Date: 02/06/2024  END OF SESSION:  Visit Number 1 Number of Visits 13 Date for PT re-eval 03/19/2024 Authorization Type MCR  PT start time 1005 PT stop time 1045 PT time calculation (min) 40 min  Equipment Utilized during treatment: [facility] for tasks assessed/performed   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   LIMA to LAD SVG to OM   Past Surgical History:  Procedure Laterality Date   AORTIC VALVE REPLACEMENT N/A 02/22/2020   Procedure: AORTIC VALVE REPLACEMENT (AVR) USING EDWARDS Resilia 25 MM AORTIC VALVE.;  Surgeon: Dusty Sudie DEL, MD;  Location: [facility] OR;  Service: Open Heart Surgery;  Laterality: N/A;   CARDIAC CATHETERIZATION     CERVICAL SPINE SURGERY     CHOLECYSTECTOMY     CORONARY ARTERY BYPASS GRAFT N/A 02/22/2020   Procedure: CORONARY ARTERY BYPASS GRAFTING (CABG) USING LIMA to LAD; ENDSCOPICALLY HARVEST RIGHT GREATER SAPHENOUS VEIN: SVG to OM1;  Surgeon: Dusty Sudie DEL, MD;  Location: [facility] OR;  Service: Open Heart Surgery;  Laterality: N/A;   ELBOW SURGERY     ENDOVEIN HARVEST OF GREATER SAPHENOUS VEIN Right 02/22/2020   Procedure: ENDOVEIN HARVEST OF GREATER SAPHENOUS VEIN;  Surgeon: Dusty Sudie DEL, MD;  Location: [facility] OR;  Service: Open Heart Surgery;  Laterality: Right;   KIDNEY STONE SURGERY     LEFT HEART CATH AND CORONARY ANGIOGRAPHY N/A 02/22/2020   Procedure: LEFT HEART CATH AND CORONARY ANGIOGRAPHY;  Surgeon: Claudene Victory ORN, MD;  Location: [facility] INVASIVE CV LAB;  Service: Cardiovascular;  Laterality: N/A;   TEE WITHOUT CARDIOVERSION N/A 02/22/2020    Procedure: TRANSESOPHAGEAL ECHOCARDIOGRAM (TEE);  Surgeon: Dusty Sudie DEL, MD;  Location: [facility] OR;  Service: Open Heart Surgery;  Laterality: N/A;   Patient Active Problem List   Diagnosis Date Noted   Bilateral primary osteoarthritis of knee 01/22/2024   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) 01/22/2024   Cervicalgia 01/22/2024   Complete rotator cuff tear or rupture of left shoulder, not specified as traumatic 01/22/2024   Diarrhea, unspecified 01/22/2024   Dry eye syndrome of bilateral lacrimal glands 01/22/2024   Encounter for current long term use of antiplatelet drug 01/22/2024   Encounter for issue of repeat prescription 01/22/2024   Exposure to potentially hazardous substance 01/22/2024   Iron deficiency anemia 01/22/2024   Obstructive sleep apnea (adult) (pediatric) 01/22/2024   Other cervical disc disorders, unspecified cervical region 01/22/2024   Other hemorrhoids 01/22/2024   Pain in joints of unspecified hand 01/22/2024   Pain in left shoulder 01/22/2024   Third degree hemorrhoids 01/22/2024   Nonrheumatic aortic valve stenosis 01/22/2024   Sensorineural hearing loss (SNHL) 01/22/2024   Cerebral infarction, unspecified (HCC) 01/22/2024   Preoperative cardiovascular examination 04/16/2022   Cerebral thrombosis with cerebral infarction 02/18/2021   Stroke-like symptoms 02/17/2021   Age-related nuclear cataract, bilateral 02/06/2021   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   Bilateral tinnitus 02/06/2021   Cough 02/06/2021   Disease of intestine, unspecified 02/06/2021   Encounter for fitting and adjustment of hearing aid 02/06/2021  Former heavy tobacco smoker 02/06/2021   History of cholecystectomy 02/06/2021   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   Insomnia, unspecified 02/06/2021   Atypical chest pain 02/06/2021   Encounter for follow-up examination after completed treatment for conditions other than malignant neoplasm  02/06/2021   Pain in right knee 02/06/2021   Parkinson's disease (HCC) 02/06/2021   Posterior tibial tendinitis, right leg 02/06/2021   Postnasal drip 02/06/2021   Tremor, unspecified 02/06/2021   Aortic stenosis 02/22/2020   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   Coronary artery disease 02/22/2020   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   S/P aortic valve replacement with bioprosthetic valve 02/22/2020   Unstable angina (HCC)    Body mass index (BMI) 28.0-28.9, adult 01/18/2020   Essential (primary) hypertension 01/18/2020   Lumbar radiculopathy 01/18/2020   Asymmetrical hearing loss of both ears 02/21/2017   Chronic low back pain 06/07/2016   Family history of coronary artery disease 09/23/2014   Hyperlipidemia 09/23/2014   S/P coronary artery stent placement 09/23/2014   Cellulitis and abscess of hand 08/31/2014   Rash 04/23/2014   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   Bilateral sensorineural hearing loss 08/06/2012    PCP: Virgia Dorothe BRAVO, MD  REFERRING PROVIDER:  Darnella Dorn SAUNDERS, MD   REFERRING DIAG:  [phone] (ICD-10-CM) - Spinal stenosis, cervical region   THERAPY DIAG:  Cervicalgia  Abnormal posture  Rationale for Evaluation and Treatment: Rehabilitation  ONSET DATE: 8/182025 date of referral   SUBJECTIVE:                                                                                                                                                                                                         SUBJECTIVE STATEMENT: He had cervical surgery about 10 years ago. However, he had a lot of pain and stiffness after waking up a few weeks ago. He states that he went to the orthopedic who told him that nothing new is happening.    PERTINENT HISTORY:  Relevant PMHx includes Aortic Stenosis (2021), CAD< HTN, CABG x 2 (2021), CVA (01/22/24), L RTC tear, OSA, iron-deficiency anemia, Parkinson's disease, BIL sensorineural hearing loss  PAIN:  Are you having pain?  Yes:  NPRS scale: 6-7/10 Pain location: BIL neck, L>R Pain description: tightness, numbness (both hands), shooting pains (hands)  Aggravating factors: driving  Relieving factors: capsaicin, tylenol    PRECAUTIONS: Other: consideration for recent acute ischemic stroke and PMHx as noted above  RED FLAGS: None     WEIGHT BEARING RESTRICTIONS: No  FALLS:  Has patient fallen in last 6 months? No  LIVING ENVIRONMENT: Lives with: lives with their spouse and 5 dogs Lives in: House/apartment Stairs: yes with railing  Has following equipment at home: walking stick when my neck was stiff'  OCCUPATION: n/a  PLOF: Independent  PATIENT GOALS: to have improved mobility   NEXT MD VISIT: no f/u sched with PCP or referring provider at time of eval   OBJECTIVE:  Note: Objective measures were completed at Evaluation unless otherwise noted.   PATIENT SURVEYS:  NDI:  NECK DISABILITY INDEX  Date: 02/06/2024 Score  Pain intensity 2 = The pain is moderate at the moment  2. Personal care (washing, dressing, etc.) 1 =  I can look after myself normally but it causes extra pain  3. Lifting 4 =  I can only lift very light weights  4. Reading 2 =  I can read as much as I want with moderate pain in my neck  5. Headaches 1 =  I have slight headaches, which come infrequently  6. Concentration 0 =  I can concentrate fully when I want to with no difficulty  7. Work 4 = I can hardly do any work at all  8. Driving 3 = I can't drive my car as long as I want because of moderate pain in my neck  9. Sleeping 3 =  My sleep is moderately disturbed (2-3 hrs sleepless)  10. Recreation 2 = I am able to engage in most, but not all of my usual recreation activities because of   pain in my neck  Total 22/50   Minimum Detectable Change (90% confidence): 5 points or 10% points  COGNITION: Overall cognitive status: Within functional limits for tasks assessed  POSTURE: rounded shoulders, forward head, and increased  thoracic kyphosis   CERVICAL ROM:   Active ROM A/PROM (deg) eval  Flexion 20  Extension 20  Right lateral flexion 20  Left lateral flexion 10  Right rotation 30  Left rotation 35   (Blank rows = not tested)  UPPER EXTREMITY ROM:  Active ROM Right eval Left eval  Shoulder flexion    Shoulder extension    Shoulder abduction    Shoulder adduction    Shoulder extension    Shoulder internal rotation    Shoulder external rotation    Elbow flexion    Elbow extension    Wrist flexion    Wrist extension    Wrist ulnar deviation    Wrist radial deviation    Wrist pronation    Wrist supination     (Blank rows = not tested)  UPPER EXTREMITY MMT:  MMT Right eval Left eval  Shoulder flexion    Shoulder extension    Shoulder abduction    Shoulder adduction    Shoulder extension    Shoulder internal rotation    Shoulder external rotation    Middle trapezius    Lower trapezius    Elbow flexion    Elbow extension    Wrist flexion    Wrist extension    Wrist ulnar deviation    Wrist radial deviation    Wrist pronation    Wrist supination    Grip strength     (Blank rows = not tested)  CERVICAL SPECIAL TESTS:  Deferred for f/u visit as indicated.    TREATMENT DATE:   [facility] Adult PT Treatment:  DATE: 02/06/2024  Initial evaluation: see patient education and home exercise program as noted below                                                                                                                                   PATIENT EDUCATION:  Education details: reviewed initial home exercise program; discussion of POC, prognosis and goals for skilled PT   Person educated: Patient Education method: Explanation, Demonstration, and Handouts Education comprehension: verbalized understanding, returned demonstration, and needs further education  HOME EXERCISE PROGRAM: Access Code: [email or web address] URL:  [email or web address] Date: 02/06/2024 Prepared by: Marko Molt  Exercises - Cervical Extension AROM with Strap  - 2 x daily - 7 x weekly - 2 sets - 5-10 reps - 3 sec hold - Seated Assisted Cervical Rotation with Towel  - 2 x daily - 7 x weekly - 2 sets - 5-10 reps - 3 sec hold - Seated Passive Cervical Retraction  - 2 x daily - 7 x weekly - 2 sets - 5-10 reps - 3 sec hold  ASSESSMENT:  CLINICAL IMPRESSION: Adison is a 73 y.o. male who was seen today for physical therapy evaluation and treatment for subacute cervical pain with mobility deficits. He also has paresthesia distally of BIL UE; unable to r/u presence of radiculopathy during initial evaluation. He is demonstrating diminished CS AROM, and decreased postural mm endurance. He has related pain and difficulty with normal daily activities, which includes driving, reading, lifting/carrying, and personal care tasks. He requires skilled PT services at this time to address relevant deficits and improve overall function.     OBJECTIVE IMPAIRMENTS: decreased activity tolerance, decreased endurance, impaired UE functional use, postural dysfunction, and pain.   ACTIVITY LIMITATIONS: carrying, lifting, sleeping, bed mobility, bathing, dressing, reach over head, and hygiene/grooming  PARTICIPATION LIMITATIONS: meal prep, cleaning, driving, and community activity  PERSONAL FACTORS: Age, Past/current experiences, and 3+ comorbidities: Relevant PMHx includes Aortic Stenosis (2021), CAD< HTN, CABG x 2 (2021), CVA (01/22/24), L RTC tear, OSA, iron-deficiency anemia, Parkinson's disease, BIL sensorineural hearing loss are also affecting patient's functional outcome.   REHAB POTENTIAL: Fair   CLINICAL DECISION MAKING: Evolving/moderate complexity  EVALUATION COMPLEXITY: Moderate   GOALS: Goals reviewed with patient? YES  SHORT TERM GOALS: Target date: 02/27/2024    Patient will be independent with initial home program at least 3  days/week.  Baseline: provided at eval Goal Status: INITIAL   2.  Patient will demonstrate improved postural awareness for at least 15 minutes while seated without need for cueing from PT.  Baseline: see objective measures Goal Status: INITIAL     LONG TERM GOALS: Target date: 03/19/2024   Patient will report improved overall functional ability with  NDI score of 12/50 or less.  Baseline: 22/50 Goal Status: INITIAL    2.  Patient will demonstrate improved CS AROM by at least 10 degrees of flexion, extension, and R/L lateral flexion.  Baseline: see  ROM chart above Goal status: INITIAL  3.  Patient will report decreased frequency of BIL UE paresthesia, including ability to independently elicit centralization of symptoms Goal status: INITIAL    PLAN:  PT FREQUENCY: 1-2x/week  PT DURATION: 6 weeks  PLANNED INTERVENTIONS: 97164- PT Re-evaluation, 97750- Physical Performance Testing, 97110-Therapeutic exercises, 97530- Therapeutic activity, [id number]- Neuromuscular re-education, 97535- Self Care, [id number]- Manual therapy, [id number]- Electrical stimulation (unattended), 02987- Traction (mechanical), 20560 (1-2 muscles), 20561 (3+ muscles)- Dry Needling, Patient/Family education, Taping, Spinal mobilization, Cryotherapy, and Moist heat  PLAN FOR NEXT SESSION: continue CS ROM, isometric strengthening, postural/periscapular mm endurance; assess ULTT and provide appropriate neural mobility exercises as indicated   Marko Molt, PT, DPT  02/09/2024 7:55 PM

## 2024-02-12 ENCOUNTER — Ambulatory Visit

## 2024-02-12 DIAGNOSIS — M5412 Radiculopathy, cervical region: Secondary | ICD-10-CM | POA: Insufficient documentation

## 2024-02-12 DIAGNOSIS — R293 Abnormal posture: Secondary | ICD-10-CM | POA: Diagnosis present

## 2024-02-12 NOTE — Therapy (Signed)
 OUTPATIENT PHYSICAL THERAPY TREATMENT NOTE   Patient Name: Alan Mcdonald MRN: [id number] DOB:March 22, 1951, 73 y.o., male Today's Date: 02/06/2024  END OF SESSION:  PT End of Session - 02/12/24 1731     Visit Number 2    Number of Visits 13    Date for PT Re-Evaluation 03/19/24    Authorization Type MCR    PT Start Time 1745    PT Stop Time 1825    PT Time Calculation (min) 40 min    Activity Tolerance Patient tolerated treatment well    Behavior During Therapy [facility] for tasks assessed/performed          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   LIMA to LAD SVG to OM   Past Surgical History:  Procedure Laterality Date   AORTIC VALVE REPLACEMENT N/A 02/22/2020   Procedure: AORTIC VALVE REPLACEMENT (AVR) USING EDWARDS Resilia 25 MM AORTIC VALVE.;  Surgeon: Dusty Sudie DEL, MD;  Location: [facility] OR;  Service: Open Heart Surgery;  Laterality: N/A;   CARDIAC CATHETERIZATION     CERVICAL SPINE SURGERY     CHOLECYSTECTOMY     CORONARY ARTERY BYPASS GRAFT N/A 02/22/2020   Procedure: CORONARY ARTERY BYPASS GRAFTING (CABG) USING LIMA to LAD; ENDSCOPICALLY HARVEST RIGHT GREATER SAPHENOUS VEIN: SVG to OM1;  Surgeon: Dusty Sudie DEL, MD;  Location: [facility] OR;  Service: Open Heart Surgery;  Laterality: N/A;   ELBOW SURGERY     ENDOVEIN HARVEST OF GREATER SAPHENOUS VEIN Right 02/22/2020   Procedure: ENDOVEIN HARVEST OF GREATER SAPHENOUS VEIN;  Surgeon: Dusty Sudie DEL, MD;  Location: [facility] OR;  Service: Open Heart Surgery;  Laterality: Right;   KIDNEY STONE SURGERY     LEFT HEART CATH AND CORONARY ANGIOGRAPHY N/A 02/22/2020   Procedure: LEFT HEART CATH AND CORONARY ANGIOGRAPHY;  Surgeon: Claudene Victory ORN, MD;  Location: [facility]  INVASIVE CV LAB;  Service: Cardiovascular;  Laterality: N/A;   TEE WITHOUT CARDIOVERSION N/A 02/22/2020   Procedure: TRANSESOPHAGEAL ECHOCARDIOGRAM (TEE);  Surgeon: Dusty Sudie DEL, MD;  Location: [facility] OR;  Service: Open Heart Surgery;  Laterality: N/A;   Patient Active Problem List   Diagnosis Date Noted   Bilateral primary osteoarthritis of knee 01/22/2024   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) 01/22/2024   Cervicalgia 01/22/2024   Complete rotator cuff tear or rupture of left shoulder, not specified as traumatic 01/22/2024   Diarrhea, unspecified 01/22/2024   Dry eye syndrome of bilateral lacrimal glands 01/22/2024   Encounter for current long term use of antiplatelet drug 01/22/2024   Encounter for issue of repeat prescription 01/22/2024   Exposure to potentially hazardous substance 01/22/2024   Iron deficiency anemia 01/22/2024   Obstructive sleep apnea (adult) (pediatric) 01/22/2024   Other cervical disc disorders, unspecified cervical region 01/22/2024   Other hemorrhoids 01/22/2024   Pain in joints of unspecified hand 01/22/2024   Pain in left shoulder 01/22/2024   Third degree hemorrhoids 01/22/2024   Nonrheumatic aortic valve stenosis 01/22/2024   Sensorineural hearing loss (SNHL) 01/22/2024   Cerebral infarction, unspecified (HCC) 01/22/2024   Preoperative cardiovascular examination 04/16/2022   Cerebral thrombosis with cerebral infarction 02/18/2021   Stroke-like symptoms 02/17/2021  Age-related nuclear cataract, bilateral 02/06/2021   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   Bilateral tinnitus 02/06/2021   Cough 02/06/2021   Disease of intestine, unspecified 02/06/2021   Encounter for fitting and adjustment of hearing aid 02/06/2021   Former heavy tobacco smoker 02/06/2021   History of cholecystectomy 02/06/2021   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   Insomnia, unspecified 02/06/2021   Atypical chest pain 02/06/2021    Encounter for follow-up examination after completed treatment for conditions other than malignant neoplasm 02/06/2021   Pain in right knee 02/06/2021   Parkinson's disease (HCC) 02/06/2021   Posterior tibial tendinitis, right leg 02/06/2021   Postnasal drip 02/06/2021   Tremor, unspecified 02/06/2021   Aortic stenosis 02/22/2020   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   Coronary artery disease 02/22/2020   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   S/P aortic valve replacement with bioprosthetic valve 02/22/2020   Unstable angina (HCC)    Body mass index (BMI) 28.0-28.9, adult 01/18/2020   Essential (primary) hypertension 01/18/2020   Lumbar radiculopathy 01/18/2020   Asymmetrical hearing loss of both ears 02/21/2017   Chronic low back pain 06/07/2016   Family history of coronary artery disease 09/23/2014   Hyperlipidemia 09/23/2014   S/P coronary artery stent placement 09/23/2014   Cellulitis and abscess of hand 08/31/2014   Rash 04/23/2014   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   Bilateral sensorineural hearing loss 08/06/2012    PCP: Virgia Dorothe BRAVO, MD  REFERRING PROVIDER:  Darnella Dorn SAUNDERS, MD   REFERRING DIAG:  [id number] (ICD-10-CM) - Spinal stenosis, cervical region   THERAPY DIAG:  Radiculopathy, cervical region  Abnormal posture  Rationale for Evaluation and Treatment: Rehabilitation  ONSET DATE: 8/182025 date of referral   SUBJECTIVE:                                                                                                                                                                                                         SUBJECTIVE STATEMENT: Patient reports that he is completing HEP morning and night and is noticing some improvements.  EVAL: He had cervical surgery about 10 years ago. However, he had a lot of pain and stiffness after waking up a few weeks ago. He states that he went to the orthopedic who told him that nothing new is happening.    PERTINENT  HISTORY:  Relevant PMHx includes Aortic Stenosis (2021), CAD< HTN, CABG x 2 (2021), CVA (01/22/24), L RTC tear, OSA, iron-deficiency anemia, Parkinson's disease, BIL sensorineural hearing loss  PAIN:  Are you having pain?  Yes: NPRS scale:  6-7/10 Pain location: BIL neck, L>R Pain description: tightness, numbness (both hands), shooting pains (hands)  Aggravating factors: driving  Relieving factors: capsaicin, tylenol    PRECAUTIONS: Other: consideration for recent acute ischemic stroke and PMHx as noted above  RED FLAGS: None     WEIGHT BEARING RESTRICTIONS: No  FALLS:  Has patient fallen in last 6 months? No  LIVING ENVIRONMENT: Lives with: lives with their spouse and 5 dogs Lives in: House/apartment Stairs: yes with railing  Has following equipment at home: walking stick when my neck was stiff'  OCCUPATION: n/a  PLOF: Independent  PATIENT GOALS: to have improved mobility   NEXT MD VISIT: no f/u sched with PCP or referring provider at time of eval   OBJECTIVE:  Note: Objective measures were completed at Evaluation unless otherwise noted.   PATIENT SURVEYS:  NDI:  NECK DISABILITY INDEX  Date: 02/06/2024 Score  Pain intensity 2 = The pain is moderate at the moment  2. Personal care (washing, dressing, etc.) 1 =  I can look after myself normally but it causes extra pain  3. Lifting 4 =  I can only lift very light weights  4. Reading 2 =  I can read as much as I want with moderate pain in my neck  5. Headaches 1 =  I have slight headaches, which come infrequently  6. Concentration 0 =  I can concentrate fully when I want to with no difficulty  7. Work 4 = I can hardly do any work at all  8. Driving 3 = I can't drive my car as long as I want because of moderate pain in my neck  9. Sleeping 3 =  My sleep is moderately disturbed (2-3 hrs sleepless)  10. Recreation 2 = I am able to engage in most, but not all of my usual recreation activities because of   pain in my neck   Total 22/50   Minimum Detectable Change (90% confidence): 5 points or 10% points  COGNITION: Overall cognitive status: Within functional limits for tasks assessed  POSTURE: rounded shoulders, forward head, and increased thoracic kyphosis   CERVICAL ROM:   Active ROM A/PROM (deg) eval  Flexion 20  Extension 20  Right lateral flexion 20  Left lateral flexion 10  Right rotation 30  Left rotation 35   (Blank rows = not tested)  UPPER EXTREMITY ROM:  Active ROM Right eval Left eval  Shoulder flexion    Shoulder extension    Shoulder abduction    Shoulder adduction    Shoulder extension    Shoulder internal rotation    Shoulder external rotation    Elbow flexion    Elbow extension    Wrist flexion    Wrist extension    Wrist ulnar deviation    Wrist radial deviation    Wrist pronation    Wrist supination     (Blank rows = not tested)  UPPER EXTREMITY MMT:  MMT Right eval Left eval  Shoulder flexion    Shoulder extension    Shoulder abduction    Shoulder adduction    Shoulder extension    Shoulder internal rotation    Shoulder external rotation    Middle trapezius    Lower trapezius    Elbow flexion    Elbow extension    Wrist flexion    Wrist extension    Wrist ulnar deviation    Wrist radial deviation    Wrist pronation    Wrist supination  Grip strength     (Blank rows = not tested)  CERVICAL SPECIAL TESTS:  Deferred for f/u visit as indicated.    TREATMENT DATE:  [facility] Adult PT Treatment:                                                DATE: 02/12/24 Therapeutic Exercise: Nustep level 5 x 8 mins while gathering subjective and planning session with patient Seated upper trap stretch 2x30 BIL Neuromuscular re-ed: SNAGS rotation x10 BIL Cervical ext over towel x10 Seated BIL ER with scap retraction Seated ITYs x10 ea  [facility] Adult PT Treatment:                                                DATE: 02/06/2024  Initial evaluation: see patient  education and home exercise program as noted below                                                                                                                                  PATIENT EDUCATION:  Education details: reviewed initial home exercise program; discussion of POC, prognosis and goals for skilled PT   Person educated: Patient Education method: Explanation, Demonstration, and Handouts Education comprehension: verbalized understanding, returned demonstration, and needs further education  HOME EXERCISE PROGRAM: Access Code: [email or web address] URL: [email or web address] Date: 02/06/2024 Prepared by: Marko Molt  Exercises - Cervical Extension AROM with Strap  - 2 x daily - 7 x weekly - 2 sets - 5-10 reps - 3 sec hold - Seated Assisted Cervical Rotation with Towel  - 2 x daily - 7 x weekly - 2 sets - 5-10 reps - 3 sec hold - Seated Passive Cervical Retraction  - 2 x daily - 7 x weekly - 2 sets - 5-10 reps - 3 sec hold  ASSESSMENT:  CLINICAL IMPRESSION: Patient presents to first follow up PT session reporting he has noticed some improvement in his pain since doing his HEP 2x daily. Session today focused on periscapular and shoulder strengthening. Patient was able to tolerate all prescribed exercises with no adverse effects. Patient continues to benefit from skilled PT services and should be progressed as able to improve functional independence.   EVAL: Davarious is a 73 y.o. male who was seen today for physical therapy evaluation and treatment for subacute cervical pain with mobility deficits. He also has paresthesia distally of BIL UE; unable to r/u presence of radiculopathy during initial evaluation. He is demonstrating diminished CS AROM, and decreased postural mm endurance. He has related pain and difficulty with normal daily activities, which includes driving, reading, lifting/carrying, and personal care tasks. He requires skilled PT services at this time  to address relevant  deficits and improve overall function.     OBJECTIVE IMPAIRMENTS: decreased activity tolerance, decreased endurance, impaired UE functional use, postural dysfunction, and pain.   ACTIVITY LIMITATIONS: carrying, lifting, sleeping, bed mobility, bathing, dressing, reach over head, and hygiene/grooming  PARTICIPATION LIMITATIONS: meal prep, cleaning, driving, and community activity  PERSONAL FACTORS: Age, Past/current experiences, and 3+ comorbidities: Relevant PMHx includes Aortic Stenosis (2021), CAD< HTN, CABG x 2 (2021), CVA (01/22/24), L RTC tear, OSA, iron-deficiency anemia, Parkinson's disease, BIL sensorineural hearing loss are also affecting patient's functional outcome.   REHAB POTENTIAL: Fair   CLINICAL DECISION MAKING: Evolving/moderate complexity  EVALUATION COMPLEXITY: Moderate   GOALS: Goals reviewed with patient? YES  SHORT TERM GOALS: Target date: 02/27/2024   Patient will be independent with initial home program at least 3 days/week.  Baseline: provided at eval Goal Status: INITIAL   2.  Patient will demonstrate improved postural awareness for at least 15 minutes while seated without need for cueing from PT.  Baseline: see objective measures Goal Status: INITIAL     LONG TERM GOALS: Target date: 03/19/2024   Patient will report improved overall functional ability with  NDI score of 12/50 or less.  Baseline: 22/50 Goal Status: INITIAL    2.  Patient will demonstrate improved CS AROM by at least 10 degrees of flexion, extension, and R/L lateral flexion.  Baseline: see ROM chart above Goal status: INITIAL  3.  Patient will report decreased frequency of BIL UE paresthesia, including ability to independently elicit centralization of symptoms Goal status: INITIAL   PLAN:  PT FREQUENCY: 1-2x/week  PT DURATION: 6 weeks  PLANNED INTERVENTIONS: 97164- PT Re-evaluation, 97750- Physical Performance Testing, 97110-Therapeutic exercises, 97530- Therapeutic  activity, [id number]- Neuromuscular re-education, 97535- Self Care, [id number]- Manual therapy, [id number]- Electrical stimulation (unattended), [phone]- Traction (mechanical), 20560 (1-2 muscles), 20561 (3+ muscles)- Dry Needling, Patient/Family education, Taping, Spinal mobilization, Cryotherapy, and Moist heat  PLAN FOR NEXT SESSION: continue CS ROM, isometric strengthening, postural/periscapular mm endurance; assess ULTT and provide appropriate neural mobility exercises as indicated   Corean Pouch PTA  02/12/2024 6:27 PM

## 2024-02-18 ENCOUNTER — Ambulatory Visit: Payer: Self-pay

## 2024-02-18 DIAGNOSIS — M5412 Radiculopathy, cervical region: Secondary | ICD-10-CM

## 2024-02-18 DIAGNOSIS — R293 Abnormal posture: Secondary | ICD-10-CM

## 2024-02-18 NOTE — Therapy (Signed)
 OUTPATIENT PHYSICAL THERAPY TREATMENT NOTE   Patient Name: Alan Mcdonald MRN: [id number] DOB:November 18, 1950, 73 y.o., male Today's Date: 02/06/2024  END OF SESSION:  PT End of Session - 02/18/24 0950     Visit Number 3    Number of Visits 13    Date for PT Re-Evaluation 03/19/24    Authorization Type MCR    PT Start Time 0955    PT Stop Time 1035    PT Time Calculation (min) 40 min    Activity Tolerance Patient tolerated treatment well    Behavior During Therapy [facility] for tasks assessed/performed          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   LIMA to LAD SVG to OM   Past Surgical History:  Procedure Laterality Date   AORTIC VALVE REPLACEMENT N/A 02/22/2020   Procedure: AORTIC VALVE REPLACEMENT (AVR) USING EDWARDS Resilia 25 MM AORTIC VALVE.;  Surgeon: Dusty Sudie DEL, MD;  Location: [facility] OR;  Service: Open Heart Surgery;  Laterality: N/A;   CARDIAC CATHETERIZATION     CERVICAL SPINE SURGERY     CHOLECYSTECTOMY     CORONARY ARTERY BYPASS GRAFT N/A 02/22/2020   Procedure: CORONARY ARTERY BYPASS GRAFTING (CABG) USING LIMA to LAD; ENDSCOPICALLY HARVEST RIGHT GREATER SAPHENOUS VEIN: SVG to OM1;  Surgeon: Dusty Sudie DEL, MD;  Location: [facility] OR;  Service: Open Heart Surgery;  Laterality: N/A;   ELBOW SURGERY     ENDOVEIN HARVEST OF GREATER SAPHENOUS VEIN Right 02/22/2020   Procedure: ENDOVEIN HARVEST OF GREATER SAPHENOUS VEIN;  Surgeon: Dusty Sudie DEL, MD;  Location: [facility] OR;  Service: Open Heart Surgery;  Laterality: Right;   KIDNEY STONE SURGERY     LEFT HEART CATH AND CORONARY ANGIOGRAPHY N/A 02/22/2020   Procedure: LEFT HEART CATH AND CORONARY ANGIOGRAPHY;  Surgeon: Claudene Victory ORN, MD;  Location: [facility]  INVASIVE CV LAB;  Service: Cardiovascular;  Laterality: N/A;   TEE WITHOUT CARDIOVERSION N/A 02/22/2020   Procedure: TRANSESOPHAGEAL ECHOCARDIOGRAM (TEE);  Surgeon: Dusty Sudie DEL, MD;  Location: [facility] OR;  Service: Open Heart Surgery;  Laterality: N/A;   Patient Active Problem List   Diagnosis Date Noted   Bilateral primary osteoarthritis of knee 01/22/2024   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) 01/22/2024   Cervicalgia 01/22/2024   Complete rotator cuff tear or rupture of left shoulder, not specified as traumatic 01/22/2024   Diarrhea, unspecified 01/22/2024   Dry eye syndrome of bilateral lacrimal glands 01/22/2024   Encounter for current long term use of antiplatelet drug 01/22/2024   Encounter for issue of repeat prescription 01/22/2024   Exposure to potentially hazardous substance 01/22/2024   Iron deficiency anemia 01/22/2024   Obstructive sleep apnea (adult) (pediatric) 01/22/2024   Other cervical disc disorders, unspecified cervical region 01/22/2024   Other hemorrhoids 01/22/2024   Pain in joints of unspecified hand 01/22/2024   Pain in left shoulder 01/22/2024   Third degree hemorrhoids 01/22/2024   Nonrheumatic aortic valve stenosis 01/22/2024   Sensorineural hearing loss (SNHL) 01/22/2024   Cerebral infarction, unspecified (HCC) 01/22/2024   Preoperative cardiovascular examination 04/16/2022   Cerebral thrombosis with cerebral infarction 02/18/2021   Stroke-like symptoms 02/17/2021  Age-related nuclear cataract, bilateral 02/06/2021   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   Bilateral tinnitus 02/06/2021   Cough 02/06/2021   Disease of intestine, unspecified 02/06/2021   Encounter for fitting and adjustment of hearing aid 02/06/2021   Former heavy tobacco smoker 02/06/2021   History of cholecystectomy 02/06/2021   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   Insomnia, unspecified 02/06/2021   Atypical chest pain 02/06/2021    Encounter for follow-up examination after completed treatment for conditions other than malignant neoplasm 02/06/2021   Pain in right knee 02/06/2021   Parkinson's disease (HCC) 02/06/2021   Posterior tibial tendinitis, right leg 02/06/2021   Postnasal drip 02/06/2021   Tremor, unspecified 02/06/2021   Aortic stenosis 02/22/2020   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   Coronary artery disease 02/22/2020   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   S/P aortic valve replacement with bioprosthetic valve 02/22/2020   Unstable angina (HCC)    Body mass index (BMI) 28.0-28.9, adult 01/18/2020   Essential (primary) hypertension 01/18/2020   Lumbar radiculopathy 01/18/2020   Asymmetrical hearing loss of both ears 02/21/2017   Chronic low back pain 06/07/2016   Family history of coronary artery disease 09/23/2014   Hyperlipidemia 09/23/2014   S/P coronary artery stent placement 09/23/2014   Cellulitis and abscess of hand 08/31/2014   Rash 04/23/2014   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   Bilateral sensorineural hearing loss 08/06/2012    PCP: Virgia Dorothe BRAVO, MD  REFERRING PROVIDER:  Darnella Dorn SAUNDERS, MD   REFERRING DIAG:  [phone] (ICD-10-CM) - Spinal stenosis, cervical region   THERAPY DIAG:  Radiculopathy, cervical region  Abnormal posture  Rationale for Evaluation and Treatment: Rehabilitation  ONSET DATE: 8/182025 date of referral   SUBJECTIVE:                                                                                                                                                                                                         SUBJECTIVE STATEMENT: Patient reports that he is having a gout flare on his right foot, is taking a medication for it. He states his neck pain is continuing to improve.   EVAL: He had cervical surgery about 10 years ago. However, he had a lot of pain and stiffness after waking up a few weeks ago. He states that he went to the orthopedic who told him  that nothing new is happening.    PERTINENT HISTORY:  Relevant PMHx includes Aortic Stenosis (2021), CAD< HTN, CABG x 2 (2021), CVA (01/22/24), L RTC tear, OSA, iron-deficiency anemia, Parkinson's disease, BIL  sensorineural hearing loss  PAIN:  Are you having pain?  Yes: NPRS scale: 6-7/10 Pain location: BIL neck, L>R Pain description: tightness, numbness (both hands), shooting pains (hands)  Aggravating factors: driving  Relieving factors: capsaicin, tylenol    PRECAUTIONS: Other: consideration for recent acute ischemic stroke and PMHx as noted above  RED FLAGS: None     WEIGHT BEARING RESTRICTIONS: No  FALLS:  Has patient fallen in last 6 months? No  LIVING ENVIRONMENT: Lives with: lives with their spouse and 5 dogs Lives in: House/apartment Stairs: yes with railing  Has following equipment at home: walking stick when my neck was stiff'  OCCUPATION: n/a  PLOF: Independent  PATIENT GOALS: to have improved mobility   NEXT MD VISIT: no f/u sched with PCP or referring provider at time of eval   OBJECTIVE:  Note: Objective measures were completed at Evaluation unless otherwise noted.   PATIENT SURVEYS:  NDI:  NECK DISABILITY INDEX  Date: 02/06/2024 Score  Pain intensity 2 = The pain is moderate at the moment  2. Personal care (washing, dressing, etc.) 1 =  I can look after myself normally but it causes extra pain  3. Lifting 4 =  I can only lift very light weights  4. Reading 2 =  I can read as much as I want with moderate pain in my neck  5. Headaches 1 =  I have slight headaches, which come infrequently  6. Concentration 0 =  I can concentrate fully when I want to with no difficulty  7. Work 4 = I can hardly do any work at all  8. Driving 3 = I can't drive my car as long as I want because of moderate pain in my neck  9. Sleeping 3 =  My sleep is moderately disturbed (2-3 hrs sleepless)  10. Recreation 2 = I am able to engage in most, but not all of my usual  recreation activities because of   pain in my neck  Total 22/50   Minimum Detectable Change (90% confidence): 5 points or 10% points  COGNITION: Overall cognitive status: Within functional limits for tasks assessed  POSTURE: rounded shoulders, forward head, and increased thoracic kyphosis   CERVICAL ROM:   Active ROM A/PROM (deg) eval  Flexion 20  Extension 20  Right lateral flexion 20  Left lateral flexion 10  Right rotation 30  Left rotation 35   (Blank rows = not tested)  UPPER EXTREMITY ROM:  Active ROM Right eval Left eval  Shoulder flexion    Shoulder extension    Shoulder abduction    Shoulder adduction    Shoulder extension    Shoulder internal rotation    Shoulder external rotation    Elbow flexion    Elbow extension    Wrist flexion    Wrist extension    Wrist ulnar deviation    Wrist radial deviation    Wrist pronation    Wrist supination     (Blank rows = not tested)  UPPER EXTREMITY MMT:  MMT Right eval Left eval  Shoulder flexion    Shoulder extension    Shoulder abduction    Shoulder adduction    Shoulder extension    Shoulder internal rotation    Shoulder external rotation    Middle trapezius    Lower trapezius    Elbow flexion    Elbow extension    Wrist flexion    Wrist extension    Wrist ulnar deviation    Wrist  radial deviation    Wrist pronation    Wrist supination    Grip strength     (Blank rows = not tested)  CERVICAL SPECIAL TESTS:  Deferred for f/u visit as indicated.    TREATMENT DATE:  [facility] Adult PT Treatment:                                                DATE: 02/18/24 Therapeutic Exercise: UBE level 1 3'/3' fwd/bwd while gathering subjective and planning session with patient Neuromuscular re-ed: SNAGS rotation x10 BIL Cervical ext over towel x10 Seated BIL ER with scap retraction RTB 2x10 Seated horizontal abduction RTB 2x10 Seated ITYs x10 ea  [facility] Adult PT Treatment:                                                 DATE: 02/12/24 Therapeutic Exercise: Nustep level 5 x 8 mins while gathering subjective and planning session with patient Seated upper trap stretch 2x30 BIL Neuromuscular re-ed: SNAGS rotation x10 BIL Cervical ext over towel x10 Seated BIL ER with scap retraction Seated ITYs x10 ea  [facility] Adult PT Treatment:                                                DATE: 02/06/2024  Initial evaluation: see patient education and home exercise program as noted below                                                                                                                                  PATIENT EDUCATION:  Education details: reviewed initial home exercise program; discussion of POC, prognosis and goals for skilled PT   Person educated: Patient Education method: Explanation, Demonstration, and Handouts Education comprehension: verbalized understanding, returned demonstration, and needs further education  HOME EXERCISE PROGRAM: Access Code: [email or web address] URL: [email or web address] Date: 02/06/2024 Prepared by: Marko Molt  Exercises - Cervical Extension AROM with Strap  - 2 x daily - 7 x weekly - 2 sets - 5-10 reps - 3 sec hold - Seated Assisted Cervical Rotation with Towel  - 2 x daily - 7 x weekly - 2 sets - 5-10 reps - 3 sec hold - Seated Passive Cervical Retraction  - 2 x daily - 7 x weekly - 2 sets - 5-10 reps - 3 sec hold  ASSESSMENT:  CLINICAL IMPRESSION: Patient presents to PT reporting continued improvements in his neck pain, is having a gout flare in his Rt foot and states he is on medication for it. Session today  focused on periscapular and shoulder strengthening. Updated HEP with patient demonstrating understanding of each exercise. Patient was able to tolerate all prescribed exercises with no adverse effects. Patient continues to benefit from skilled PT services and should be progressed as able to improve functional independence.   EVAL: Christobal is a 73  y.o. male who was seen today for physical therapy evaluation and treatment for subacute cervical pain with mobility deficits. He also has paresthesia distally of BIL UE; unable to r/u presence of radiculopathy during initial evaluation. He is demonstrating diminished CS AROM, and decreased postural mm endurance. He has related pain and difficulty with normal daily activities, which includes driving, reading, lifting/carrying, and personal care tasks. He requires skilled PT services at this time to address relevant deficits and improve overall function.     OBJECTIVE IMPAIRMENTS: decreased activity tolerance, decreased endurance, impaired UE functional use, postural dysfunction, and pain.   ACTIVITY LIMITATIONS: carrying, lifting, sleeping, bed mobility, bathing, dressing, reach over head, and hygiene/grooming  PARTICIPATION LIMITATIONS: meal prep, cleaning, driving, and community activity  PERSONAL FACTORS: Age, Past/current experiences, and 3+ comorbidities: Relevant PMHx includes Aortic Stenosis (2021), CAD< HTN, CABG x 2 (2021), CVA (01/22/24), L RTC tear, OSA, iron-deficiency anemia, Parkinson's disease, BIL sensorineural hearing loss are also affecting patient's functional outcome.   REHAB POTENTIAL: Fair   CLINICAL DECISION MAKING: Evolving/moderate complexity  EVALUATION COMPLEXITY: Moderate   GOALS: Goals reviewed with patient? YES  SHORT TERM GOALS: Target date: 02/27/2024   Patient will be independent with initial home program at least 3 days/week.  Baseline: provided at eval Goal Status: INITIAL   2.  Patient will demonstrate improved postural awareness for at least 15 minutes while seated without need for cueing from PT.  Baseline: see objective measures Goal Status: INITIAL     LONG TERM GOALS: Target date: 03/19/2024   Patient will report improved overall functional ability with  NDI score of 12/50 or less.  Baseline: 22/50 Goal Status: INITIAL    2.  Patient will  demonstrate improved CS AROM by at least 10 degrees of flexion, extension, and R/L lateral flexion.  Baseline: see ROM chart above Goal status: INITIAL  3.  Patient will report decreased frequency of BIL UE paresthesia, including ability to independently elicit centralization of symptoms Goal status: INITIAL   PLAN:  PT FREQUENCY: 1-2x/week  PT DURATION: 6 weeks  PLANNED INTERVENTIONS: 97164- PT Re-evaluation, 97750- Physical Performance Testing, 97110-Therapeutic exercises, 97530- Therapeutic activity, [id number]- Neuromuscular re-education, 97535- Self Care, [id number]- Manual therapy, [id number]- Electrical stimulation (unattended), [phone]- Traction (mechanical), 20560 (1-2 muscles), 20561 (3+ muscles)- Dry Needling, Patient/Family education, Taping, Spinal mobilization, Cryotherapy, and Moist heat  PLAN FOR NEXT SESSION: continue CS ROM, isometric strengthening, postural/periscapular mm endurance; assess ULTT and provide appropriate neural mobility exercises as indicated   Corean Pouch PTA  02/18/2024 10:34 AM

## 2024-02-25 ENCOUNTER — Ambulatory Visit

## 2024-02-25 DIAGNOSIS — M5412 Radiculopathy, cervical region: Secondary | ICD-10-CM | POA: Diagnosis not present

## 2024-02-25 DIAGNOSIS — R293 Abnormal posture: Secondary | ICD-10-CM

## 2024-02-25 NOTE — Therapy (Signed)
 OUTPATIENT PHYSICAL THERAPY TREATMENT NOTE   Patient Name: Alan Mcdonald MRN: [id number] DOB:1950/08/05, 73 y.o., male Today's Date: 02/06/2024  END OF SESSION:  PT End of Session - 02/25/24 1608     Visit Number 4    Number of Visits 13    Date for PT Re-Evaluation 03/19/24    Authorization Type MCR    PT Start Time 1615    PT Stop Time 1653    PT Time Calculation (min) 38 min    Activity Tolerance Patient tolerated treatment well    Behavior During Therapy [facility] for tasks assessed/performed           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   LIMA to LAD SVG to OM   Past Surgical History:  Procedure Laterality Date   AORTIC VALVE REPLACEMENT N/A 02/22/2020   Procedure: AORTIC VALVE REPLACEMENT (AVR) USING EDWARDS Resilia 25 MM AORTIC VALVE.;  Surgeon: Dusty Sudie DEL, MD;  Location: [facility] OR;  Service: Open Heart Surgery;  Laterality: N/A;   CARDIAC CATHETERIZATION     CERVICAL SPINE SURGERY     CHOLECYSTECTOMY     CORONARY ARTERY BYPASS GRAFT N/A 02/22/2020   Procedure: CORONARY ARTERY BYPASS GRAFTING (CABG) USING LIMA to LAD; ENDSCOPICALLY HARVEST RIGHT GREATER SAPHENOUS VEIN: SVG to OM1;  Surgeon: Dusty Sudie DEL, MD;  Location: [facility] OR;  Service: Open Heart Surgery;  Laterality: N/A;   ELBOW SURGERY     ENDOVEIN HARVEST OF GREATER SAPHENOUS VEIN Right 02/22/2020   Procedure: ENDOVEIN HARVEST OF GREATER SAPHENOUS VEIN;  Surgeon: Dusty Sudie DEL, MD;  Location: [facility] OR;  Service: Open Heart Surgery;  Laterality: Right;   KIDNEY STONE SURGERY     LEFT HEART CATH AND CORONARY ANGIOGRAPHY N/A 02/22/2020   Procedure: LEFT HEART CATH AND CORONARY ANGIOGRAPHY;  Surgeon: Claudene Victory ORN, MD;  Location: [facility]  INVASIVE CV LAB;  Service: Cardiovascular;  Laterality: N/A;   TEE WITHOUT CARDIOVERSION N/A 02/22/2020   Procedure: TRANSESOPHAGEAL ECHOCARDIOGRAM (TEE);  Surgeon: Dusty Sudie DEL, MD;  Location: 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) 01/22/2024   Cervicalgia 01/22/2024   Complete rotator cuff tear or rupture of left shoulder, not specified as traumatic 01/22/2024   Diarrhea, unspecified 01/22/2024   Dry eye syndrome of bilateral lacrimal glands 01/22/2024   Encounter for current long term use of antiplatelet drug 01/22/2024   Encounter for issue of repeat prescription 01/22/2024   Exposure to potentially hazardous substance 01/22/2024   Iron deficiency anemia 01/22/2024   Obstructive sleep apnea (adult) (pediatric) 01/22/2024   Other cervical disc disorders, unspecified cervical region 01/22/2024   Other hemorrhoids 01/22/2024   Pain in joints of unspecified hand 01/22/2024   Pain in left shoulder 01/22/2024   Third degree hemorrhoids 01/22/2024   Nonrheumatic aortic valve stenosis 01/22/2024   Sensorineural hearing loss (SNHL) 01/22/2024   Cerebral infarction, unspecified (HCC) 01/22/2024   Preoperative cardiovascular examination 04/16/2022   Cerebral thrombosis with cerebral infarction 02/18/2021   Stroke-like symptoms  02/17/2021   Age-related nuclear cataract, bilateral 02/06/2021   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   Bilateral tinnitus 02/06/2021   Cough 02/06/2021   Disease of intestine, unspecified 02/06/2021   Encounter for fitting and adjustment of hearing aid 02/06/2021   Former heavy tobacco smoker 02/06/2021   History of cholecystectomy 02/06/2021   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   Insomnia, unspecified 02/06/2021   Atypical chest pain 02/06/2021    Encounter for follow-up examination after completed treatment for conditions other than malignant neoplasm 02/06/2021   Pain in right knee 02/06/2021   Parkinson's disease (HCC) 02/06/2021   Posterior tibial tendinitis, right leg 02/06/2021   Postnasal drip 02/06/2021   Tremor, unspecified 02/06/2021   Aortic stenosis 02/22/2020   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   Coronary artery disease 02/22/2020   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   S/P aortic valve replacement with bioprosthetic valve 02/22/2020   Unstable angina (HCC)    Body mass index (BMI) 28.0-28.9, adult 01/18/2020   Essential (primary) hypertension 01/18/2020   Lumbar radiculopathy 01/18/2020   Asymmetrical hearing loss of both ears 02/21/2017   Chronic low back pain 06/07/2016   Family history of coronary artery disease 09/23/2014   Hyperlipidemia 09/23/2014   S/P coronary artery stent placement 09/23/2014   Cellulitis and abscess of hand 08/31/2014   Rash 04/23/2014   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   Bilateral sensorineural hearing loss 08/06/2012    PCP: Virgia Dorothe BRAVO, MD  REFERRING PROVIDER:  Darnella Dorn SAUNDERS, MD   REFERRING DIAG:  [phone] (ICD-10-CM) - Spinal stenosis, cervical region   THERAPY DIAG:  Radiculopathy, cervical region  Abnormal posture  Rationale for Evaluation and Treatment: Rehabilitation  ONSET DATE: 8/182025 date of referral   SUBJECTIVE:                                                                                                                                                                                                         SUBJECTIVE STATEMENT: Patient reports that his pain is the same as last week. He had a busy weekend and was unable to complete his HEP as much as he'd like.  EVAL: He had cervical surgery about 10 years ago. However, he had a lot of pain and stiffness after waking up a few weeks ago. He states that he went to the orthopedic who told him that nothing  new is happening.    PERTINENT HISTORY:  Relevant PMHx includes Aortic Stenosis (2021), CAD< HTN, CABG x 2 (2021), CVA (01/22/24), L RTC tear, OSA, iron-deficiency anemia, Parkinson's  disease, BIL sensorineural hearing loss  PAIN:  Are you having pain?  Yes: NPRS scale: 6-7/10 Pain location: BIL neck, L>R Pain description: tightness, numbness (both hands), shooting pains (hands)  Aggravating factors: driving  Relieving factors: capsaicin, tylenol    PRECAUTIONS: Other: consideration for recent acute ischemic stroke and PMHx as noted above  RED FLAGS: None     WEIGHT BEARING RESTRICTIONS: No  FALLS:  Has patient fallen in last 6 months? No  LIVING ENVIRONMENT: Lives with: lives with their spouse and 5 dogs Lives in: House/apartment Stairs: yes with railing  Has following equipment at home: walking stick when my neck was stiff'  OCCUPATION: n/a  PLOF: Independent  PATIENT GOALS: to have improved mobility   NEXT MD VISIT: no f/u sched with PCP or referring provider at time of eval   OBJECTIVE:  Note: Objective measures were completed at Evaluation unless otherwise noted.   PATIENT SURVEYS:  NDI:  NECK DISABILITY INDEX  Date: 02/06/2024 Score  Pain intensity 2 = The pain is moderate at the moment  2. Personal care (washing, dressing, etc.) 1 =  I can look after myself normally but it causes extra pain  3. Lifting 4 =  I can only lift very light weights  4. Reading 2 =  I can read as much as I want with moderate pain in my neck  5. Headaches 1 =  I have slight headaches, which come infrequently  6. Concentration 0 =  I can concentrate fully when I want to with no difficulty  7. Work 4 = I can hardly do any work at all  8. Driving 3 = I can't drive my car as long as I want because of moderate pain in my neck  9. Sleeping 3 =  My sleep is moderately disturbed (2-3 hrs sleepless)  10. Recreation 2 = I am able to engage in most, but not all of my usual recreation  activities because of   pain in my neck  Total 22/50   Minimum Detectable Change (90% confidence): 5 points or 10% points  COGNITION: Overall cognitive status: Within functional limits for tasks assessed  POSTURE: rounded shoulders, forward head, and increased thoracic kyphosis   CERVICAL ROM:   Active ROM A/PROM (deg) eval  Flexion 20  Extension 20  Right lateral flexion 20  Left lateral flexion 10  Right rotation 30  Left rotation 35   (Blank rows = not tested)  UPPER EXTREMITY ROM:  Active ROM Right eval Left eval  Shoulder flexion    Shoulder extension    Shoulder abduction    Shoulder adduction    Shoulder extension    Shoulder internal rotation    Shoulder external rotation    Elbow flexion    Elbow extension    Wrist flexion    Wrist extension    Wrist ulnar deviation    Wrist radial deviation    Wrist pronation    Wrist supination     (Blank rows = not tested)  UPPER EXTREMITY MMT:  MMT Right eval Left eval  Shoulder flexion    Shoulder extension    Shoulder abduction    Shoulder adduction    Shoulder extension    Shoulder internal rotation    Shoulder external rotation    Middle trapezius    Lower trapezius    Elbow flexion    Elbow extension    Wrist flexion    Wrist extension    Wrist ulnar deviation  Wrist radial deviation    Wrist pronation    Wrist supination    Grip strength     (Blank rows = not tested)  CERVICAL SPECIAL TESTS:  Deferred for f/u visit as indicated.   TREATMENT DATE:  [facility] Adult PT Treatment:                                                DATE: 02/25/24 Therapeutic Exercise: UBE level 1 3'/3' fwd/bwd while gathering subjective and planning session with patient Seated upper trap stretch holding table 2x30 BIL Neuromuscular re-ed: Rows GTB 2x10 Shoulder extension GTB 2x10 Seated BIL ER with scap retraction RTB 2x10 Seated horizontal abduction RTB 2x10 Seated ITYs 2# x10 ea  [facility] Adult PT Treatment:                                                 DATE: 02/18/24 Therapeutic Exercise: UBE level 1 3'/3' fwd/bwd while gathering subjective and planning session with patient Neuromuscular re-ed: SNAGS rotation x10 BIL Cervical ext over towel x10 Seated BIL ER with scap retraction RTB 2x10 Seated horizontal abduction RTB 2x10 Seated ITYs x10 ea  [facility] Adult PT Treatment:                                                DATE: 02/12/24 Therapeutic Exercise: Nustep level 5 x 8 mins while gathering subjective and planning session with patient Seated upper trap stretch 2x30 BIL Neuromuscular re-ed: SNAGS rotation x10 BIL Cervical ext over towel x10 Seated BIL ER with scap retraction Seated ITYs x10 ea                                                                                PATIENT EDUCATION:  Education details: reviewed initial home exercise program; discussion of POC, prognosis and goals for skilled PT   Person educated: Patient Education method: Explanation, Demonstration, and Handouts Education comprehension: verbalized understanding, returned demonstration, and needs further education  HOME EXERCISE PROGRAM: Access Code: [email or web address] URL: [email or web address] Date: 02/06/2024 Prepared by: Marko Molt  Exercises - Cervical Extension AROM with Strap  - 2 x daily - 7 x weekly - 2 sets - 5-10 reps - 3 sec hold - Seated Assisted Cervical Rotation with Towel  - 2 x daily - 7 x weekly - 2 sets - 5-10 reps - 3 sec hold - Seated Passive Cervical Retraction  - 2 x daily - 7 x weekly - 2 sets - 5-10 reps - 3 sec hold  ASSESSMENT:  CLINICAL IMPRESSION: Patient presents to PT reporting the same pain as last week. Session today continued to focus on periscapular and shoulder strengthening. Patient was able to tolerate all prescribed exercises with no adverse effects. Patient continues to benefit from skilled PT services and should  be progressed as able to improve functional  independence.   EVAL: Josiel is a 72 y.o. male who was seen today for physical therapy evaluation and treatment for subacute cervical pain with mobility deficits. He also has paresthesia distally of BIL UE; unable to r/u presence of radiculopathy during initial evaluation. He is demonstrating diminished CS AROM, and decreased postural mm endurance. He has related pain and difficulty with normal daily activities, which includes driving, reading, lifting/carrying, and personal care tasks. He requires skilled PT services at this time to address relevant deficits and improve overall function.     OBJECTIVE IMPAIRMENTS: decreased activity tolerance, decreased endurance, impaired UE functional use, postural dysfunction, and pain.   ACTIVITY LIMITATIONS: carrying, lifting, sleeping, bed mobility, bathing, dressing, reach over head, and hygiene/grooming  PARTICIPATION LIMITATIONS: meal prep, cleaning, driving, and community activity  PERSONAL FACTORS: Age, Past/current experiences, and 3+ comorbidities: Relevant PMHx includes Aortic Stenosis (2021), CAD< HTN, CABG x 2 (2021), CVA (01/22/24), L RTC tear, OSA, iron-deficiency anemia, Parkinson's disease, BIL sensorineural hearing loss are also affecting patient's functional outcome.   REHAB POTENTIAL: Fair   CLINICAL DECISION MAKING: Evolving/moderate complexity  EVALUATION COMPLEXITY: Moderate   GOALS: Goals reviewed with patient? YES  SHORT TERM GOALS: Target date: 02/27/2024   Patient will be independent with initial home program at least 3 days/week.  Baseline: provided at eval Goal Status: INITIAL   2.  Patient will demonstrate improved postural awareness for at least 15 minutes while seated without need for cueing from PT.  Baseline: see objective measures Goal Status: INITIAL     LONG TERM GOALS: Target date: 03/19/2024   Patient will report improved overall functional ability with  NDI score of 12/50 or less.  Baseline: 22/50 Goal  Status: INITIAL    2.  Patient will demonstrate improved CS AROM by at least 10 degrees of flexion, extension, and R/L lateral flexion.  Baseline: see ROM chart above Goal status: INITIAL  3.  Patient will report decreased frequency of BIL UE paresthesia, including ability to independently elicit centralization of symptoms Goal status: INITIAL   PLAN:  PT FREQUENCY: 1-2x/week  PT DURATION: 6 weeks  PLANNED INTERVENTIONS: 97164- PT Re-evaluation, 97750- Physical Performance Testing, 97110-Therapeutic exercises, 97530- Therapeutic activity, [id number]- Neuromuscular re-education, 97535- Self Care, [id number]- Manual therapy, [id number]- Electrical stimulation (unattended), 02987- Traction (mechanical), 20560 (1-2 muscles), 20561 (3+ muscles)- Dry Needling, Patient/Family education, Taping, Spinal mobilization, Cryotherapy, and Moist heat  PLAN FOR NEXT SESSION: continue CS ROM, isometric strengthening, postural/periscapular mm endurance; assess ULTT and provide appropriate neural mobility exercises as indicated   Corean Pouch PTA  02/25/2024 4:53 PM

## 2024-02-27 ENCOUNTER — Ambulatory Visit

## 2024-02-27 DIAGNOSIS — M5412 Radiculopathy, cervical region: Secondary | ICD-10-CM

## 2024-02-27 DIAGNOSIS — R293 Abnormal posture: Secondary | ICD-10-CM

## 2024-02-27 NOTE — Therapy (Signed)
 OUTPATIENT PHYSICAL THERAPY TREATMENT NOTE   Patient Name: Alan Mcdonald MRN: [id number] DOB:Mar 05, 1951, 73 y.o., male Today's Date: 02/06/2024  END OF SESSION:  PT End of Session - 02/27/24 1129     Visit Number 5    Number of Visits 13    Date for Recertification  03/19/24    Authorization Type MCR    PT Start Time 1130    PT Stop Time 1208    PT Time Calculation (min) 38 min    Activity Tolerance Patient tolerated treatment well    Behavior During Therapy [facility] for tasks assessed/performed            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   LIMA to LAD SVG to OM   Past Surgical History:  Procedure Laterality Date   AORTIC VALVE REPLACEMENT N/A 02/22/2020   Procedure: AORTIC VALVE REPLACEMENT (AVR) USING EDWARDS Resilia 25 MM AORTIC VALVE.;  Surgeon: Dusty Sudie DEL, MD;  Location: [facility] OR;  Service: Open Heart Surgery;  Laterality: N/A;   CARDIAC CATHETERIZATION     CERVICAL SPINE SURGERY     CHOLECYSTECTOMY     CORONARY ARTERY BYPASS GRAFT N/A 02/22/2020   Procedure: CORONARY ARTERY BYPASS GRAFTING (CABG) USING LIMA to LAD; ENDSCOPICALLY HARVEST RIGHT GREATER SAPHENOUS VEIN: SVG to OM1;  Surgeon: Dusty Sudie DEL, MD;  Location: [facility] OR;  Service: Open Heart Surgery;  Laterality: N/A;   ELBOW SURGERY     ENDOVEIN HARVEST OF GREATER SAPHENOUS VEIN Right 02/22/2020   Procedure: ENDOVEIN HARVEST OF GREATER SAPHENOUS VEIN;  Surgeon: Dusty Sudie DEL, MD;  Location: [facility] OR;  Service: Open Heart Surgery;  Laterality: Right;   KIDNEY STONE SURGERY     LEFT HEART CATH AND CORONARY ANGIOGRAPHY N/A 02/22/2020   Procedure: LEFT HEART CATH AND CORONARY ANGIOGRAPHY;  Surgeon: Claudene Victory ORN, MD;  Location:  [facility] INVASIVE CV LAB;  Service: Cardiovascular;  Laterality: N/A;   TEE WITHOUT CARDIOVERSION N/A 02/22/2020   Procedure: TRANSESOPHAGEAL ECHOCARDIOGRAM (TEE);  Surgeon: Dusty Sudie DEL, MD;  Location: [facility] OR;  Service: Open Heart Surgery;  Laterality: N/A;   Patient Active Problem List   Diagnosis Date Noted   Bilateral primary osteoarthritis of knee 01/22/2024   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) 01/22/2024   Cervicalgia 01/22/2024   Complete rotator cuff tear or rupture of left shoulder, not specified as traumatic 01/22/2024   Diarrhea, unspecified 01/22/2024   Dry eye syndrome of bilateral lacrimal glands 01/22/2024   Encounter for current long term use of antiplatelet drug 01/22/2024   Encounter for issue of repeat prescription 01/22/2024   Exposure to potentially hazardous substance 01/22/2024   Iron deficiency anemia 01/22/2024   Obstructive sleep apnea (adult) (pediatric) 01/22/2024   Other cervical disc disorders, unspecified cervical region 01/22/2024   Other hemorrhoids 01/22/2024   Pain in joints of unspecified hand 01/22/2024   Pain in left shoulder 01/22/2024   Third degree hemorrhoids 01/22/2024   Nonrheumatic aortic valve stenosis 01/22/2024   Sensorineural hearing loss (SNHL) 01/22/2024   Cerebral infarction, unspecified (HCC) 01/22/2024   Preoperative cardiovascular examination 04/16/2022   Cerebral thrombosis with cerebral infarction 02/18/2021   Stroke-like  symptoms 02/17/2021   Age-related nuclear cataract, bilateral 02/06/2021   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   Bilateral tinnitus 02/06/2021   Cough 02/06/2021   Disease of intestine, unspecified 02/06/2021   Encounter for fitting and adjustment of hearing aid 02/06/2021   Former heavy tobacco smoker 02/06/2021   History of cholecystectomy 02/06/2021   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   Insomnia, unspecified 02/06/2021   Atypical chest pain 02/06/2021    Encounter for follow-up examination after completed treatment for conditions other than malignant neoplasm 02/06/2021   Pain in right knee 02/06/2021   Parkinson's disease (HCC) 02/06/2021   Posterior tibial tendinitis, right leg 02/06/2021   Postnasal drip 02/06/2021   Tremor, unspecified 02/06/2021   Aortic stenosis 02/22/2020   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   Coronary artery disease 02/22/2020   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   S/P aortic valve replacement with bioprosthetic valve 02/22/2020   Unstable angina (HCC)    Body mass index (BMI) 28.0-28.9, adult 01/18/2020   Essential (primary) hypertension 01/18/2020   Lumbar radiculopathy 01/18/2020   Asymmetrical hearing loss of both ears 02/21/2017   Chronic low back pain 06/07/2016   Family history of coronary artery disease 09/23/2014   Hyperlipidemia 09/23/2014   S/P coronary artery stent placement 09/23/2014   Cellulitis and abscess of hand 08/31/2014   Rash 04/23/2014   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   Bilateral sensorineural hearing loss 08/06/2012    PCP: Virgia Dorothe BRAVO, MD  REFERRING PROVIDER:  Darnella Dorn SAUNDERS, MD   REFERRING DIAG:  [phone] (ICD-10-CM) - Spinal stenosis, cervical region   THERAPY DIAG:  Radiculopathy, cervical region  Abnormal posture  Rationale for Evaluation and Treatment: Rehabilitation  ONSET DATE: 8/182025 date of referral   SUBJECTIVE:                                                                                                                                                                                                         SUBJECTIVE STATEMENT: Patient reports that he feels good about PT so far, he is compliant with HEP and has noticed meaningful progress so far. However, he continues to have pain when looking to the left. He feels ready for more progression of exercises/challenge  EVAL: He had cervical surgery about 10 years ago. However, he had a lot of pain and  stiffness after waking up a few weeks ago. He states that he went to the orthopedic who told him that nothing new is happening.    PERTINENT HISTORY:  Relevant PMHx includes Aortic Stenosis (2021), CAD<  HTN, CABG x 2 (2021), CVA (01/22/24), L RTC tear, OSA, iron-deficiency anemia, Parkinson's disease, BIL sensorineural hearing loss  PAIN:  Are you having pain?  Yes: NPRS scale: 6-7/10 Pain location: BIL neck, L>R Pain description: tightness, numbness (both hands), shooting pains (hands)  Aggravating factors: driving  Relieving factors: capsaicin, tylenol    PRECAUTIONS: Other: consideration for recent acute ischemic stroke and PMHx as noted above  RED FLAGS: None     WEIGHT BEARING RESTRICTIONS: No  FALLS:  Has patient fallen in last 6 months? No  LIVING ENVIRONMENT: Lives with: lives with their spouse and 5 dogs Lives in: House/apartment Stairs: yes with railing  Has following equipment at home: walking stick when my neck was stiff'  OCCUPATION: n/a  PLOF: Independent  PATIENT GOALS: to have improved mobility   NEXT MD VISIT: no f/u sched with PCP or referring provider at time of eval   OBJECTIVE:  Note: Objective measures were completed at Evaluation unless otherwise noted.   PATIENT SURVEYS:  NDI:  NECK DISABILITY INDEX  Date: 02/06/2024 Score  Pain intensity 2 = The pain is moderate at the moment  2. Personal care (washing, dressing, etc.) 1 =  I can look after myself normally but it causes extra pain  3. Lifting 4 =  I can only lift very light weights  4. Reading 2 =  I can read as much as I want with moderate pain in my neck  5. Headaches 1 =  I have slight headaches, which come infrequently  6. Concentration 0 =  I can concentrate fully when I want to with no difficulty  7. Work 4 = I can hardly do any work at all  8. Driving 3 = I can't drive my car as long as I want because of moderate pain in my neck  9. Sleeping 3 =  My sleep is moderately disturbed  (2-3 hrs sleepless)  10. Recreation 2 = I am able to engage in most, but not all of my usual recreation activities because of   pain in my neck  Total 22/50   Minimum Detectable Change (90% confidence): 5 points or 10% points  COGNITION: Overall cognitive status: Within functional limits for tasks assessed  POSTURE: rounded shoulders, forward head, and increased thoracic kyphosis   CERVICAL ROM:   Active ROM A/PROM (deg) eval  Flexion 20  Extension 20  Right lateral flexion 20  Left lateral flexion 10  Right rotation 30  Left rotation 35   (Blank rows = not tested)  UPPER EXTREMITY ROM:  Active ROM Right eval Left eval  Shoulder flexion    Shoulder extension    Shoulder abduction    Shoulder adduction    Shoulder extension    Shoulder internal rotation    Shoulder external rotation    Elbow flexion    Elbow extension    Wrist flexion    Wrist extension    Wrist ulnar deviation    Wrist radial deviation    Wrist pronation    Wrist supination     (Blank rows = not tested)  UPPER EXTREMITY MMT:  MMT Right eval Left eval  Shoulder flexion    Shoulder extension    Shoulder abduction    Shoulder adduction    Shoulder extension    Shoulder internal rotation    Shoulder external rotation    Middle trapezius    Lower trapezius    Elbow flexion    Elbow extension    Wrist  flexion    Wrist extension    Wrist ulnar deviation    Wrist radial deviation    Wrist pronation    Wrist supination    Grip strength     (Blank rows = not tested)  CERVICAL SPECIAL TESTS:  Deferred for f/u visit as indicated.   TREATMENT DATE:   [facility] Adult PT Treatment:                                                DATE: 02/27/2024  Therapeutic Exercise: UBE level 1 3'/3' fwd/bwd while gathering subjective and planning session with patient CS isometric flexion, L/R side bending, 3 sec hold x 5 each direction  Updated and reviewed HEP  Neuromuscular re-ed: Rows GTB  2x10 Shoulder extension GTB 2x10 Seated BIL ER with scap retraction RTB 2x15 Seated horizontal abduction RTB 2x15 Seated ITYs 2# x10 ea   [facility] Adult PT Treatment:                                                DATE: 02/25/24 Therapeutic Exercise: UBE level 1 3'/3' fwd/bwd while gathering subjective and planning session with patient Seated upper trap stretch holding table 2x30 BIL Neuromuscular re-ed: Rows GTB 2x10 Shoulder extension GTB 2x10 Seated BIL ER with scap retraction RTB 2x10 Seated horizontal abduction RTB 2x10 Seated ITYs 2# x10 ea  [facility] Adult PT Treatment:                                                DATE: 02/18/24 Therapeutic Exercise: UBE level 1 3'/3' fwd/bwd while gathering subjective and planning session with patient Neuromuscular re-ed: SNAGS rotation x10 BIL Cervical ext over towel x10 Seated BIL ER with scap retraction RTB 2x10 Seated horizontal abduction RTB 2x10 Seated ITYs x10 ea                                                                                 PATIENT EDUCATION:  Education details: reviewed initial home exercise program; discussion of POC, prognosis and goals for skilled PT   Person educated: Patient Education method: Explanation, Demonstration, and Handouts Education comprehension: verbalized understanding, returned demonstration, and needs further education  HOME EXERCISE PROGRAM: Access Code: [email or web address] URL: [email or web address] Date: 02/27/2024 Prepared by: Marko Molt  Exercises - Cervical Extension AROM with Strap  - 2 x daily - 7 x weekly - 2 sets - 5-10 reps - 3 sec hold - Seated Assisted Cervical Rotation with Towel  - 2 x daily - 7 x weekly - 2 sets - 5-10 reps - 3 sec hold - Seated Passive Cervical Retraction  - 2 x daily - 7 x weekly - 2 sets - 5-10 reps - 3 sec hold - SEATED Shoulder External Rotation and Scapular Retraction with Resistance  -  1 x daily - 7 x weekly - 3 sets - 10 reps - Standing  Shoulder Horizontal Abduction with Resistance  - 1 x daily - 7 x weekly - 3 sets - 10 reps - Standing Isometric Cervical Sidebending with Manual Resistance  - 1 x daily - 7 x weekly - 1 sets - 10 reps - 3 sec hold - Standing Isometric Cervical Flexion with Manual Resistance  - 1 x daily - 7 x weekly - 1 sets - 10 reps - 3 sec hold  ASSESSMENT:  CLINICAL IMPRESSION: 02/27/2024 Patient is demonstrating good tolerance of today's session. Initiated remaining 3 directions of CS isometrics, and updated HEP. Plan is to continue progression per POC, including increased resistance. Patient remains motivated to be challenged for the sake of feeling better.   EVAL: Alan Mcdonald is a 73 y.o. male who was seen today for physical therapy evaluation and treatment for subacute cervical pain with mobility deficits. He also has paresthesia distally of BIL UE; unable to r/u presence of radiculopathy during initial evaluation. He is demonstrating diminished CS AROM, and decreased postural mm endurance. He has related pain and difficulty with normal daily activities, which includes driving, reading, lifting/carrying, and personal care tasks. He requires skilled PT services at this time to address relevant deficits and improve overall function.     OBJECTIVE IMPAIRMENTS: decreased activity tolerance, decreased endurance, impaired UE functional use, postural dysfunction, and pain.   ACTIVITY LIMITATIONS: carrying, lifting, sleeping, bed mobility, bathing, dressing, reach over head, and hygiene/grooming  PARTICIPATION LIMITATIONS: meal prep, cleaning, driving, and community activity  PERSONAL FACTORS: Age, Past/current experiences, and 3+ comorbidities: Relevant PMHx includes Aortic Stenosis (2021), CAD< HTN, CABG x 2 (2021), CVA (01/22/24), L RTC tear, OSA, iron-deficiency anemia, Parkinson's disease, BIL sensorineural hearing loss are also affecting patient's functional outcome.   REHAB POTENTIAL: Fair   CLINICAL DECISION  MAKING: Evolving/moderate complexity  EVALUATION COMPLEXITY: Moderate   GOALS: Goals reviewed with patient? YES  SHORT TERM GOALS: Target date: 02/27/2024   Patient will be independent with initial home program at least 3 days/week.  Baseline: provided at eval Goal Status: INITIAL   2.  Patient will demonstrate improved postural awareness for at least 15 minutes while seated without need for cueing from PT.  Baseline: see objective measures Goal Status: INITIAL     LONG TERM GOALS: Target date: 03/19/2024   Patient will report improved overall functional ability with  NDI score of 12/50 or less.  Baseline: 22/50 Goal Status: INITIAL    2.  Patient will demonstrate improved CS AROM by at least 10 degrees of flexion, extension, and R/L lateral flexion.  Baseline: see ROM chart above Goal status: INITIAL  3.  Patient will report decreased frequency of BIL UE paresthesia, including ability to independently elicit centralization of symptoms Goal status: INITIAL   PLAN:  PT FREQUENCY: 1-2x/week  PT DURATION: 6 weeks  PLANNED INTERVENTIONS: 97164- PT Re-evaluation, 97750- Physical Performance Testing, 97110-Therapeutic exercises, 97530- Therapeutic activity, [id number]- Neuromuscular re-education, 97535- Self Care, [id number]- Manual therapy, [id number]- Electrical stimulation (unattended), 02987- Traction (mechanical), 20560 (1-2 muscles), 20561 (3+ muscles)- Dry Needling, Patient/Family education, Taping, Spinal mobilization, Cryotherapy, and Moist heat  PLAN FOR NEXT SESSION: continue CS ROM, isometric strengthening, postural/periscapular mm endurance; assess ULTT and provide appropriate neural mobility exercises as indicated   Marko Molt, PT, DPT  02/27/2024 5:24 PM

## 2024-03-02 ENCOUNTER — Ambulatory Visit

## 2024-03-03 ENCOUNTER — Ambulatory Visit

## 2024-03-04 ENCOUNTER — Encounter

## 2024-03-05 ENCOUNTER — Encounter

## 2024-03-10 ENCOUNTER — Ambulatory Visit

## 2024-03-12 ENCOUNTER — Ambulatory Visit

## 2024-07-13 ENCOUNTER — Ambulatory Visit: Admitting: Neurology

## 2024-07-30 ENCOUNTER — Ambulatory Visit: Admitting: Cardiology

## 2025-01-27 ENCOUNTER — Ambulatory Visit: Admitting: Neurology

## 2025-02-09 ENCOUNTER — Ambulatory Visit: Admitting: Neurology
